# Patient Record
Sex: Male | Born: 2000 | Race: Black or African American | Hispanic: No | Marital: Single | Smoking: Never smoker
Health system: Southern US, Community
[De-identification: ages and names within clinical notes are randomized; demographics above are authoritative.]

## PROBLEM LIST (undated history)

## (undated) DIAGNOSIS — F1910 Other psychoactive substance abuse, uncomplicated: Secondary | ICD-10-CM

## (undated) DIAGNOSIS — F209 Schizophrenia, unspecified: Secondary | ICD-10-CM

## (undated) HISTORY — PX: NO PAST SURGERIES: SHX2092

---
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
## 2020-12-10 ENCOUNTER — Emergency Department (HOSPITAL_COMMUNITY)
Admission: EM | Admit: 2020-12-10 | Discharge: 2020-12-13 | Payer: BLUE CROSS/BLUE SHIELD | Attending: Emergency Medicine | Admitting: Emergency Medicine

## 2020-12-10 DIAGNOSIS — Y900 Blood alcohol level of less than 20 mg/100 ml: Secondary | ICD-10-CM | POA: Insufficient documentation

## 2020-12-10 DIAGNOSIS — Z046 Encounter for general psychiatric examination, requested by authority: Secondary | ICD-10-CM | POA: Insufficient documentation

## 2020-12-10 DIAGNOSIS — Z20822 Contact with and (suspected) exposure to covid-19: Secondary | ICD-10-CM | POA: Insufficient documentation

## 2020-12-10 DIAGNOSIS — R456 Violent behavior: Secondary | ICD-10-CM | POA: Insufficient documentation

## 2020-12-10 DIAGNOSIS — R4589 Other symptoms and signs involving emotional state: Secondary | ICD-10-CM | POA: Diagnosis present

## 2020-12-10 DIAGNOSIS — R4689 Other symptoms and signs involving appearance and behavior: Secondary | ICD-10-CM

## 2020-12-10 LAB — COMPREHENSIVE METABOLIC PANEL
ALT: 18 U/L (ref 0–44)
AST: 32 U/L (ref 15–41)
Albumin: 4 g/dL (ref 3.5–5.0)
Alkaline Phosphatase: 58 U/L (ref 38–126)
Anion gap: 11 (ref 5–15)
BUN: 21 mg/dL — ABNORMAL HIGH (ref 6–20)
CO2: 24 mmol/L (ref 22–32)
Calcium: 9.2 mg/dL (ref 8.9–10.3)
Chloride: 103 mmol/L (ref 98–111)
Creatinine, Ser: 1.24 mg/dL (ref 0.61–1.24)
GFR, Estimated: >60 mL/min (ref >60)
Glucose, Bld: 126 mg/dL — ABNORMAL HIGH (ref 70–99)
Potassium: 3.9 mmol/L (ref 3.5–5.1)
Sodium: 138 mmol/L (ref 135–145)
Total Bilirubin: 2.3 mg/dL — ABNORMAL HIGH (ref 0.3–1.2)
Total Protein: 6.7 g/dL (ref 6.5–8.1)

## 2020-12-10 LAB — RAPID URINE DRUG SCREEN, HOSP PERFORMED
Amphetamines: NOT DETECTED
Barbiturates: NOT DETECTED
Benzodiazepines: NOT DETECTED
Cocaine: NOT DETECTED
Opiates: NOT DETECTED
Tetrahydrocannabinol: NOT DETECTED

## 2020-12-10 LAB — CBC WITH DIFFERENTIAL/PLATELET
Abs Immature Granulocytes: 0.02 K/uL (ref 0.00–0.07)
Basophils Absolute: 0 K/uL (ref 0.0–0.1)
Basophils Relative: 0 %
Eosinophils Absolute: 0 K/uL (ref 0.0–0.5)
Eosinophils Relative: 0 %
HCT: 40.7 % (ref 39.0–52.0)
Hemoglobin: 13.4 g/dL (ref 13.0–17.0)
Immature Granulocytes: 0 %
Lymphocytes Relative: 29 %
Lymphs Abs: 2 K/uL (ref 0.7–4.0)
MCH: 28.7 pg (ref 26.0–34.0)
MCHC: 32.9 g/dL (ref 30.0–36.0)
MCV: 87.2 fL (ref 80.0–100.0)
Monocytes Absolute: 0.8 K/uL (ref 0.1–1.0)
Monocytes Relative: 12 %
Neutro Abs: 4.1 K/uL (ref 1.7–7.7)
Neutrophils Relative %: 59 %
Platelets: 233 K/uL (ref 150–400)
RBC: 4.67 MIL/uL (ref 4.22–5.81)
RDW: 11.7 % (ref 11.5–15.5)
WBC: 6.9 K/uL (ref 4.0–10.5)
nRBC: 0 % (ref 0.0–0.2)

## 2020-12-10 LAB — URINALYSIS, ROUTINE W REFLEX MICROSCOPIC
Bilirubin Urine: NEGATIVE
Glucose, UA: NEGATIVE mg/dL
Hgb urine dipstick: NEGATIVE
Ketones, ur: 20 mg/dL — AB
Leukocytes,Ua: NEGATIVE
Nitrite: NEGATIVE
Protein, ur: 30 mg/dL — AB
Specific Gravity, Urine: 1.028 (ref 1.005–1.030)
pH: 6 (ref 5.0–8.0)

## 2020-12-10 LAB — ETHANOL: Alcohol, Ethyl (B): <10 mg/dL

## 2020-12-10 LAB — RESP PANEL BY RT-PCR (FLU A&B, COVID) ARPGX2
Influenza A by PCR: NEGATIVE
Influenza B by PCR: NEGATIVE
SARS Coronavirus 2 by RT PCR: NEGATIVE

## 2020-12-10 LAB — SALICYLATE LEVEL: Salicylate Lvl: <7 mg/dL — ABNORMAL LOW (ref 7.0–30.0)

## 2020-12-10 LAB — ACETAMINOPHEN LEVEL: Acetaminophen (Tylenol), Serum: <10 ug/mL — ABNORMAL LOW (ref 10–30)

## 2020-12-10 MED ORDER — HALOPERIDOL LACTATE 5 MG/ML IJ SOLN
10.0000 mg | Freq: Once | INTRAMUSCULAR | Status: AC
  Administered 2020-12-10: 10 mg via INTRAMUSCULAR
  Filled 2020-12-10: qty 2

## 2020-12-10 MED ORDER — STERILE WATER FOR INJECTION IJ SOLN
INTRAMUSCULAR | Status: AC
  Filled 2020-12-10: qty 10

## 2020-12-10 MED ORDER — ZIPRASIDONE MESYLATE 20 MG IM SOLR
INTRAMUSCULAR | Status: AC
  Filled 2020-12-10: qty 20

## 2020-12-10 MED ORDER — LORAZEPAM 2 MG/ML IJ SOLN
2.0000 mg | Freq: Once | INTRAMUSCULAR | Status: AC
  Administered 2020-12-10: 2 mg via INTRAVENOUS
  Filled 2020-12-10: qty 1

## 2020-12-10 NOTE — ED Notes (Signed)
Received verbal report from Steva Colder RN at this time

## 2020-12-10 NOTE — BH Assessment (Signed)
Received notice from patient's nurse that patient is asleep from being medicated. Requested nursing to contact TTS once patient awakens and is alert enough to complete the TTS assessment.

## 2020-12-10 NOTE — ED Notes (Signed)
Patient continues to bite off his blood pressure cuff, says he does not want it on. He did agree to move cuff to his left arm. Immediately after, he started to try to remove pulse ox, and blood pressure cuff again. Continues to complain that he wants restraints removed, but will not answer questions when asked if he will cooperate and follow instructions.

## 2020-12-10 NOTE — BH Assessment (Signed)
TTS assessment:  Attempted TTS 7:45pm but RN states that pt was given Haldol @ 5pm and is too sleepy to participate at this time.   Weyman Pedro, MSW, LCSW Outpatient Therapist/Triage Specialist

## 2020-12-10 NOTE — ED Notes (Signed)
Pt now eating dinner, with restraints on wrists. Declined help with feeding and has managed to eat his meal independently.

## 2020-12-10 NOTE — ED Notes (Signed)
Patient is awake and alert at this time. He woke up and asked for a drink, when I (NT/Sitter) asked him what he would like, patient turns his head and closes his eyes. When RN came in room, she asked him again and patient responded "I've been asking you all for water". At this time he is not answering many questions when asked.

## 2020-12-10 NOTE — BH Assessment (Signed)
Received notice from nursing that patient is asleep from being medicated. Clinician requested nursing to notify TTS once patient is awake and alert for the TTS assessment to be completed.
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
## 2020-12-10 NOTE — ED Notes (Signed)
Patient finished dinner tray, appears to be more calm and alert. He is now sleeping again and comfortable.

## 2020-12-10 NOTE — ED Notes (Signed)
This patient continue to cycle through the same questions, even after they have been answered. He says he is hungry, I offered to help feed patient. He declined, and asked if restraints can be removed so that he can eat his meal. He is asking to speak to the Doctor and also yelling out in the hallway "Doctor" if he thinks he sees his physician walking by. Patient is not following instructions, is asking for request that will help him get out of restraints.

## 2020-12-10 NOTE — BH Assessment (Addendum)
TTS received an order to complete patient's initial assessment. Requested nursing to place patient's TTS machine in patient's room. Prior to seeing patient reviewed chart. However, no documentation of this visit is noted in patient's chart for this episode. No lab results/medical clearance noted.  No H&P noted. No nursing notes and/or triage notes. Clinician has reached out to EDP and nursing to obtain details significant with patient's visit prior to his tele assessment. Awaiting updates.
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
## 2020-12-11 NOTE — ED Notes (Signed)
Breakfast order placed ?

## 2020-12-11 NOTE — BH Assessment (Signed)
TTS attempted to complete assessment at 0935. Pt is not engaged in assessment and is refusing to talk. Nursing staff reports that pt is able to talk and was talking prior to start of assessment. TTS will attempt assessment at a later time and/or see if NP will assist with assessment.

## 2020-12-11 NOTE — ED Notes (Signed)
Pt would not talk to TTS , remains sitting in a chair with sitter at side

## 2020-12-11 NOTE — ED Notes (Signed)
Patient's mother called for update; pt is currently sleeping;RN will ask patient if he would like to call her back when he [facility]

## 2020-12-11 NOTE — ED Notes (Signed)
Dinner ordered 

## 2020-12-11 NOTE — ED Notes (Signed)
Pt ambulated to restroom at this time.

## 2020-12-12 DIAGNOSIS — R4589 Other symptoms and signs involving emotional state: Secondary | ICD-10-CM | POA: Diagnosis present

## 2020-12-12 MED ORDER — OLANZAPINE 5 MG PO TBDP: 5.0000 mg | ORAL_TABLET | Freq: Three times a day (TID) | ORAL | Status: DC | PRN

## 2020-12-12 MED ORDER — ZIPRASIDONE MESYLATE 20 MG IM SOLR: 20.0000 mg | INTRAMUSCULAR | Status: DC | PRN

## 2020-12-12 MED ORDER — OLANZAPINE 5 MG PO TBDP
5.0000 mg | ORAL_TABLET | Freq: Every day | ORAL | Status: DC
  Filled 2020-12-12: qty 1

## 2020-12-12 MED ORDER — OLANZAPINE 5 MG PO TBDP: 2.5000 mg | ORAL_TABLET | Freq: Every day | ORAL | Status: DC

## 2020-12-12 MED ORDER — LORAZEPAM 1 MG PO TABS: 1.0000 mg | ORAL_TABLET | ORAL | Status: DC | PRN

## 2020-12-12 NOTE — BH Assessment (Addendum)
DISPOSITION: Gave clinical report to [name], NP  who determined Pt meets criteria for inpatient psychiatric treatment. Cheron Every, [facility] at [facility] advised no appropriate beds available.  Other facilities will be contacted for placement. Notified Dr.Peter Rodena Medin, MD and Heide Guile , RN of disposition recommendation and the sitter utilization recommendation.

## 2020-12-12 NOTE — ED Notes (Signed)
Pt came out of room to use the bathroom. Once pt used the bathroom he refused to leave the bathroom. Pt went back and forth from standing in the mirror to sitting on the toilet mumbling to himself and laughing. This Clinical research associate asked pt multiple times to come out of the bathroom, pt stats "in a minute". Pt now back in his room.

## 2020-12-12 NOTE — ED Notes (Signed)
Pt making his second phone call. 

## 2020-12-12 NOTE — ED Notes (Signed)
Patient requested to shower. Supplies provided to patient for shower and clean pair of scrubs.

## 2020-12-12 NOTE — ED Notes (Signed)
LCSW informed this RN that pt has a bed at [facility] and can arrive by 8am tomorrow morning. Report can be called by day shift tomorrow at [phone]

## 2020-12-12 NOTE — ED Notes (Signed)
This RN spoke with pt multiple times about taking the medication, Zyprexa that the MD has ordered. Pt smiling at ceiling and twirling his hair constantly. Pt not willing to take the medication and not participating in the conversation. This RN asked pt what he was thinking about and pt replied "when I can get out of here." This RN explained that we are here to help him and want him to get better. This RN offered to listen if he wanted to talk about anything on his mind. Explained to pt that we can only help him as much as he allows [place] to.

## 2020-12-12 NOTE — Progress Notes (Signed)
Patient has been faxed out due to no bed availability at [facility]. Patient meets inpatient criteria per [facility]. Patient referred to the following facilities:  [facility]  [address] [place] [address] [phone]  [facility]  [address] [place] [address] [phone]  [facility]  [address], [place] [address] [phone]  [facility]  [address] [place] [address] [phone]  [facility]  [address] [place] [address] [phone]  [facility]  [address] [place] [address] [phone]  [facility]  [address] [place] [address] [phone]  [facility]  [address] [place], [place] [address] [phone]  [facility]  [address] [place], [place] [address] [phone]  [facility]  [address] [place] [address] [phone]  [facility]  [address], [place] [address] [phone]  [facility]  [address], [place] [address] [phone]     CSW will continue to monitor disposition.    Damita Dunnings, MSW, LCSW-A  10:38 AM 12/12/2020

## 2020-12-12 NOTE — Consult Note (Signed)
Telepsych Consultation   Reason for Consult: Dangerous behaviors to others. Referring Physician:  Arby Barrette, MD. Location of Patient: [facility] Location of Provider: Other: [facility]  Patient Identification: Jerry Garrison MRN:  [id number] Principal Diagnosis: Threatening to others Diagnosis:  Principal Problem:   Threatening to others   Total Time spent with patient: 15 minutes  Subjective:   Jerry Garrison is a 20 y.o. male patient admitted with Patient is brought by [facility] Department with IVC.  Patient was apprehended in [place] and was combative with police.  It required multiple individuals to restrain the patient and bring him to the emergency department in handcuffs.  Patient's mother had taken out IVC reporting that the patient had gone to a neighbor's house and pulled out a gun.  Reportedly the neighbor then pulled out a gun as well.  His mother was able to arrive at the home and bring the patient home without injury to anybody.  Patient's mother reported the patient has history of manic episodes and proceeded to get IVC paperwork for the patient.Marland Kitchen  HPI:  Patient seen via tele health by this provider; chart reviewed and consulted with Jerry Garrison on 12/12/20.  On evaluation Jerry Garrison refuses to participate in the assessment. This provider repeatedly asked the patient to engage in the assessment to determine a treatment plan, patient refused and remained mute.  During evaluation Jerry Garrison is sitting in a chair with his head held down, twirling his hair. He is alert. Unable to assess the patient's orientation. He is uncooperative; and this provider is unable to assess the patient's mood. Unable to assess if the patient is suicidal, or homicidal. Unable to assess the patient's thought process and content. He is selectively mute with no eye contact. Unable to assess if the patient is currently responding to internal/external stimuli or experiencing delusional content.   Per  nursing staff, the patient has been communicating with staff.    Past Psychiatric History:Per chart review: On two separate occassions in 2017, the patient had two episodes of erratic behaviors related to Marijuana use.   Risk to Self:  yes  Risk to Others:  yes Prior Inpatient Therapy:  unknown Prior Outpatient Therapy:  unknown  Past Medical History: No past medical history on file. No past surgical history on file. Family History: No family history on file. Family Psychiatric  History: Unknown Social History:  Social History   Substance and Sexual Activity  Alcohol Use No     Social History   Substance and Sexual Activity  Drug Use Yes   Types: Marijuana    Social History   Socioeconomic History   Marital status: Single    Spouse name: Not on file   Number of children: Not on file   Years of education: Not on file   Highest education level: Not on file  Occupational History   Not on file  Tobacco Use   Smoking status: Never   Smokeless tobacco: Not on file  Substance and Sexual Activity   Alcohol use: No   Drug use: Yes    Types: Marijuana   Sexual activity: Not on file  Other Topics Concern   Not on file  Social History Narrative   Not on file   Social Determinants of Health   Financial Resource Strain: Not on file  Food Insecurity: Not on file  Transportation Needs: Not on file  Physical Activity: Not on file  Stress: Not on file  Social Connections: Not on file  Additional Social History:    Allergies:  No Known Allergies  Labs:  Results for orders placed or performed during the hospital encounter of 12/10/20 (from the past 48 hour(s))  Resp Panel by RT-PCR (Flu A&B, Covid) Nasopharyngeal Swab     Status: None   Collection Time: 12/10/20 10:45 AM   Specimen: Nasopharyngeal Swab; Nasopharyngeal(NP) swabs in vial transport medium  Result Value Ref Range   SARS Coronavirus 2 by RT PCR NEGATIVE NEGATIVE    Comment: (NOTE) SARS-CoV-2 target  nucleic acids are NOT DETECTED.  The SARS-CoV-2 RNA is generally detectable in upper respiratory specimens during the acute phase of infection. The lowest concentration of SARS-CoV-2 viral copies this assay can detect is 138 copies/mL. A negative result does not preclude SARS-Cov-2 infection and should not be used as the sole basis for treatment or other patient management decisions. A negative result may occur with  improper specimen collection/handling, submission of specimen other than nasopharyngeal swab, presence of viral mutation(s) within the areas targeted by this assay, and inadequate number of viral copies(<138 copies/mL). A negative result must be combined with clinical observations, patient history, and epidemiological information. The expected result is Negative.  Fact Sheet for Patients:  BloggerCourse.com  Fact Sheet for Healthcare Providers:  SeriousBroker.it  This test is no t yet approved or cleared by the [place] FDA and  has been authorized for detection and/or diagnosis of SARS-CoV-2 by FDA under an Emergency Use Authorization (EUA). This EUA will remain  in effect (meaning this test can be used) for the duration of the COVID-19 declaration under Section 564(b)(1) of the Act, 21 U.S.C.section 360bbb-3(b)(1), unless the authorization is terminated  or revoked sooner.       Influenza A by PCR NEGATIVE NEGATIVE   Influenza B by PCR NEGATIVE NEGATIVE    Comment: (NOTE) The Xpert Xpress SARS-CoV-2/FLU/RSV plus assay is intended as an aid in the diagnosis of influenza from Nasopharyngeal swab specimens and should not be used as a sole basis for treatment. Nasal washings and aspirates are unacceptable for Xpert Xpress SARS-CoV-2/FLU/RSV testing.  Fact Sheet for Patients: BloggerCourse.com  Fact Sheet for Healthcare Providers: SeriousBroker.it  This test  is not yet approved or cleared by the [place] FDA and has been authorized for detection and/or diagnosis of SARS-CoV-2 by FDA under an Emergency Use Authorization (EUA). This EUA will remain in effect (meaning this test can be used) for the duration of the COVID-19 declaration under Section 564(b)(1) of the Act, 21 U.S.C. section 360bbb-3(b)(1), unless the authorization is terminated or revoked.  Performed at [facility], [address] [place], [place] [address]   Comprehensive metabolic panel     Status: Abnormal   Collection Time: 12/10/20 10:46 AM  Result Value Ref Range   Sodium 138 135 - 145 mmol/Garrison   Potassium 3.9 3.5 - 5.1 mmol/Garrison   Chloride 103 98 - 111 mmol/Garrison   CO2 24 22 - 32 mmol/Garrison   Glucose, Bld 126 (H) 70 - 99 mg/dL    Comment: Glucose reference range applies only to samples taken after fasting for at least 8 hours.   BUN 21 (H) 6 - 20 mg/dL   Creatinine, Ser 6.38 0.61 - 1.24 mg/dL   Calcium 9.2 8.9 - 75.6 mg/dL   Total Protein 6.7 6.5 - 8.1 g/dL   Albumin 4.0 3.5 - 5.0 g/dL   AST 32 15 - 41 U/Garrison   ALT 18 0 - 44 U/Garrison   Alkaline Phosphatase 58  38 - 126 U/Garrison   Total Bilirubin 2.3 (H) 0.3 - 1.2 mg/dL   GFR, Estimated >40 >98 mL/min    Comment: (NOTE) Calculated using the CKD-EPI Creatinine Equation (2021)    Anion gap 11 5 - 15    Comment: Performed at [facility], [address], [place], [place] [address]  Ethanol     Status: None   Collection Time: 12/10/20 10:46 AM  Result Value Ref Range   Alcohol, Ethyl (B) <10 <10 mg/dL    Comment: (NOTE) Lowest detectable limit for serum alcohol is 10 mg/dL.  For medical purposes only. Performed at [facility], [address], [place], [place] [address]   Acetaminophen level     Status: Abnormal   Collection Time: 12/10/20 10:46 AM  Result Value Ref Range   Acetaminophen (Tylenol), Serum <10 (Garrison) 10 - 30 ug/mL    Comment: (NOTE) Therapeutic concentrations vary significantly. A range of 10-30  ug/mL  may be an effective concentration for many patients. However, some  are best treated at concentrations outside of this range. Acetaminophen concentrations >150 ug/mL at 4 hours after ingestion  and >50 ug/mL at 12 hours after ingestion are often associated with  toxic reactions.  Performed at [facility], [address] [place], [place] [address]   Salicylate level     Status: Abnormal   Collection Time: 12/10/20 10:46 AM  Result Value Ref Range   Salicylate Lvl <7.0 (Garrison) 7.0 - 30.0 mg/dL    Comment: Performed at [facility], [address], [place], [place] [address]  CBC with Differential     Status: None   Collection Time: 12/10/20 10:46 AM  Result Value Ref Range   WBC 6.9 4.0 - 10.5 K/uL   RBC 4.67 4.22 - 5.81 MIL/uL   Hemoglobin 13.4 13.0 - 17.0 g/dL   HCT 46.9 62.9 - 52.8 %   MCV 87.2 80.0 - 100.0 fL   MCH 28.7 26.0 - 34.0 pg   MCHC 32.9 30.0 - 36.0 g/dL   RDW 41.3 24.4 - 01.0 %   Platelets 233 150 - 400 K/uL   nRBC 0.0 0.0 - 0.2 %   Neutrophils Relative % 59 %   Neutro Abs 4.1 1.7 - 7.7 K/uL   Lymphocytes Relative 29 %   Lymphs Abs 2.0 0.7 - 4.0 K/uL   Monocytes Relative 12 %   Monocytes Absolute 0.8 0.1 - 1.0 K/uL   Eosinophils Relative 0 %   Eosinophils Absolute 0.0 0.0 - 0.5 K/uL   Basophils Relative 0 %   Basophils Absolute 0.0 0.0 - 0.1 K/uL   Immature Granulocytes 0 %   Abs Immature Granulocytes 0.02 0.00 - 0.07 K/uL    Comment: Performed at [facility], [address], [place], [place] [address]  Urinalysis, Routine w reflex microscopic Urine, Clean Catch     Status: Abnormal   Collection Time: 12/10/20  4:55 PM  Result Value Ref Range   Color, Urine YELLOW YELLOW   APPearance HAZY (A) CLEAR   Specific Gravity, Urine 1.028 1.005 - 1.030   pH 6.0 5.0 - 8.0   Glucose, UA NEGATIVE NEGATIVE mg/dL   Hgb urine dipstick NEGATIVE NEGATIVE   Bilirubin Urine NEGATIVE NEGATIVE   Ketones, ur 20 (A) NEGATIVE mg/dL   Protein, ur 30 (A)  NEGATIVE mg/dL   Nitrite NEGATIVE NEGATIVE   Leukocytes,Ua NEGATIVE NEGATIVE   RBC / HPF 0-5 0 - 5 RBC/hpf  WBC, UA 0-5 0 - 5 WBC/hpf   Bacteria, UA FEW (A) NONE SEEN   Mucus PRESENT     Comment: Performed at [facility], [address] [place], [place] [address]  Urine rapid drug screen (hosp performed)     Status: None   Collection Time: 12/10/20  4:55 PM  Result Value Ref Range   Opiates NONE DETECTED NONE DETECTED   Cocaine NONE DETECTED NONE DETECTED   Benzodiazepines NONE DETECTED NONE DETECTED   Amphetamines NONE DETECTED NONE DETECTED   Tetrahydrocannabinol NONE DETECTED NONE DETECTED   Barbiturates NONE DETECTED NONE DETECTED    Comment: (NOTE) DRUG SCREEN FOR MEDICAL PURPOSES ONLY.  IF CONFIRMATION IS NEEDED FOR ANY PURPOSE, NOTIFY LAB WITHIN 5 DAYS.  LOWEST DETECTABLE LIMITS FOR URINE DRUG SCREEN Drug Class                     Cutoff (ng/mL) Amphetamine and metabolites    1000 Barbiturate and metabolites    200 Benzodiazepine                 200 Tricyclics and metabolites     300 Opiates and metabolites        300 Cocaine and metabolites        300 THC                            50 Performed at [facility], [address] [place], [place] [address]     Medications:  No current facility-administered medications for this encounter.   No current outpatient medications on file.    Musculoskeletal: Strength & Muscle Tone:  UTA Gait & Station: unsteady, ataxic, broad based, unable to stand, shuffle,   Patient leans: N/A  Psychiatric Specialty Exam:  Presentation  General Appearance: Appropriate for Environment  Eye Contact:None  Speech:Other (comment) (UTA. Pt mute)  Speech Volume:Other (comment) (UTA)  Handedness: No data recorded  Mood and Affect  Mood:-- (UTA)  Affect:Other (comment)   Thought Process  Thought Processes:Other (comment) (UTA)  Descriptions of Associations:No data recorded Orientation:Other (comment)  (UTA)  Thought Content:Other (comment) (UTA)  History of Schizophrenia/Schizoaffective disorder:No data recorded Duration of Psychotic Symptoms:Less than six months  Hallucinations:Hallucinations: Other (comment) (UTA)  Ideas of Reference:No data recorded Suicidal Thoughts:Suicidal Thoughts: -- (UTA)  Homicidal Thoughts:Homicidal Thoughts: -- (UTA)   Sensorium  Memory:Other (comment) (UTA)  Judgment:Other (comment) (UTA)  Insight:Other (comment) (UTA)   Executive Functions  Concentration:Other (comment) (UTA)  Attention Span:Other (comment) (UTA)  Recall:Other (comment) (UTA)  Fund of Knowledge:Other (comment) (UTA)  Language:Other (comment) (UTA)   Psychomotor Activity  Psychomotor Activity: No data recorded  Assets  Assets:Other (comment) (UTA)   Sleep  Sleep:Sleep: -- (UTA)    Physical Exam: Physical Exam Constitutional:      Appearance: Normal appearance.  Cardiovascular:     Rate and Rhythm: Normal rate.  Neurological:     Mental Status: He is alert.   Review of Systems  Unable to perform ROS: Other (Patient is selectively mute)  Blood pressure 109/66, pulse 98, temperature 98.5 F (36.9 C), temperature source Oral, resp. rate 18, height 6' (1.829 m), weight 55 kg, SpO2 98 %. Body mass index is 16.44 kg/m.  Treatment Plan Summary: Daily contact with patient to assess and evaluate symptoms and progress in treatment, Medication management, and Plan Patient recommended for inpatient psychiatric treatment.  Disposition: Recommend psychiatric Inpatient admission when medically cleared.  This service was provided via telemedicine using a 2-way, interactive audio and video technology.  Names of all persons participating in this telemedicine service and their role in this encounter. Name: Jerry Garrison  Role: Patient   Name: [name] Role: NP  Name: Jerry Garrison  Role: LCSWA  Name: Jerry Garrison  Role: Psychiatrist    A secure chat was sent  to the patient's nurse Jerry Guile, RN., that the patient is recommended for inpatient psychiatric treatment.  Jerry Wilhelmsen L, NP 12/12/2020 8:28 AM

## 2020-12-12 NOTE — ED Notes (Signed)
Pt given lunch tray.

## 2020-12-12 NOTE — ED Notes (Signed)
Patient is actively doing push ups in room.

## 2020-12-12 NOTE — Consult Note (Addendum)
Jerry Garrison 19 y.o.   Patient started on Zyprexa Zydis 5 mg PO at bedtime and Zyprexa Zydis 2.5 mg PO daily for mood stabilization.   Secure chat sent to the patient's nurse Heide Guile, RN.   Agitation protocol added due to patient refusing oral medications and aggressive behaviors.

## 2020-12-12 NOTE — ED Notes (Signed)
Pt given breakfast tray. Pt eating calmly in his room.

## 2020-12-12 NOTE — ED Notes (Signed)
Pt making a phone call. ?

## 2020-12-12 NOTE — BH Assessment (Addendum)
Clinician spoke with nurse Monique.  She said that patient was sleeping at this time.  TTS to try to see patient when alert.

## 2020-12-12 NOTE — ED Notes (Signed)
Pt pulled code blue alarm in room. Pt laughing in room. Staff instructed pt not to pull this alarm.

## 2020-12-12 NOTE — BH Assessment (Addendum)
Comprehensive Clinical Assessment (CCA) Note  12/12/2020 Jerry Garrison [id number]  DISPOSITION: Gave clinical report to Jerry Nixon, NP  who determined Pt meets criteria for inpatient psychiatric treatment.  Jerry Garrison, AC at [facility] advised no appropriate bed for patient . Other facilities will be contacted for placement. Notified Dr. [name], MD and [facility] , RN of disposition recommendation and the sitter utilization recommendation.   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 Acute risk factors for suicide include: N/A. Protective factors for this patient include: positive social support. Considering these factors, the overall suicide risk at this point appears to be high. Patient is appropriate for outpatient follow up.   Patient refused to engage with TTS or NP , patient twisted his hair and looked down at the floor during the entire interview. Patient did not provide any information nor used any non verbal motions when asked questions Please refer to ED provider note below .   Per ED Provider : Patient is brought by [facility] Department with IVC.  Patient was apprehended in [place] and was combative with police.  It required multiple individuals to restrain the patient and bring him to the emergency department in handcuffs.  Patient's mother had taken out IVC reporting that the patient had gone to a neighbor's house and pulled out a gun.  Reportedly the neighbor then pulled out a gun as well.  His mother was able to arrive at the home and bring the patient home without injury to anybody.  Patient's mother reported the patient has history of manic episodes and proceeded to get IVC paperwork for the patient.  On arrival, the patient was  conversing with police and insisting he be released.  He appeared to be situationally oriented without being acutely confused.  The patient however would not answer questions for me to describe what had happened from his perspective or any of the surrounding details.  All history that I have been able to obtain is through the IVC paperwork in the police.  DISPOSITION: Gave clinical report to Jerry Nixon, NP  who determined Pt meets criteria for inpatient psychiatric treatment.  Jerry Garrison, AC at [facility] advised no appropriate bed for patient . Other facilities will be contacted for placement. Notified Dr. [name], MD and [facility] , RN of disposition recommendation and the sitter utilization recommendation.   Chief Complaint: No chief complaint on file.  Visit Diagnosis: Combative behavior  Involuntary commitment      CCA Screening, Triage and Referral (STR)  Patient Reported Information How did you hear about [place]? No data recorded Referral name: No data recorded Referral phone number: No data recorded  Whom do you see for routine medical problems? No data recorded Practice/Facility Name: No data recorded Practice/Facility Phone Number: No data recorded Name of Contact: No data recorded Contact Number: No data recorded Contact Fax Number: No data recorded Prescriber Name: No data recorded Prescriber Address (if known): No data recorded  What Is the Reason for Your Visit/Call Today? No data recorded How Long Has This Been Causing You Problems? No data recorded What Do You Feel Would Help You the Most Today? No data recorded  Have You Recently Been in Any Inpatient Treatment (Hospital/Detox/Crisis Center/28-Day Program)? No data recorded Name/Location of Program/Hospital:No  data recorded How Long Were You There? No data recorded When Were You Discharged? No data recorded  Have You Ever Received Services From [facility] Before? No data recorded Who Do You See at [facility]? No data recorded  Have You Recently Had Any Thoughts About Hurting Yourself? No data recorded Are You Planning to Commit Suicide/Harm Yourself At This time? No data recorded  Have you Recently Had Thoughts About Hurting Someone Jerry Garrison? No data recorded Explanation: No data recorded  Have You Used Any Alcohol or Drugs in the Past 24 Hours? No data recorded How Long Ago Did You Use Drugs or Alcohol? No data recorded What Did You Use and How Much? No data recorded  Do You Currently Have a Therapist/Psychiatrist? No data recorded Name of Therapist/Psychiatrist: No data recorded  Have You Been Recently Discharged From Any Office Practice or Programs? No data recorded Explanation of Discharge From Practice/Program: No data recorded    CCA Screening Triage Referral Assessment Type of Contact: No data recorded Is this Initial or Reassessment? No data recorded Date Telepsych consult ordered in [facility]:  No data recorded Time Telepsych consult ordered in [facility]:  No data recorded  Patient Reported Information Reviewed? No data recorded Patient Left Without Being Seen? No data recorded Reason for Not Completing Assessment: No data recorded  Collateral Involvement: No data recorded  Does Patient Have a Court Appointed Legal Guardian? No data recorded Name and Contact of Legal Guardian: No data recorded If Minor and Not Living with Parent(s), Who has Custody? No data recorded Is CPS involved or ever been involved? No data recorded Is APS involved or ever been involved? No data recorded  Patient Determined To Be At Risk for Harm To Self or Others Based on Review of Patient Reported Information or Presenting Complaint? No data recorded Method: No data recorded Availability of Means: No data recorded Intent: No data recorded Notification Required: No data recorded Additional Information for Danger to Others Potential: No data recorded Additional Comments for Danger to Others Potential: No  data recorded Are There Guns or Other Weapons in Your Home? No data recorded Types of Guns/Weapons: No data recorded Are These Weapons Safely Secured?                            No data recorded Who Could Verify You Are Able To Have These Secured: No data recorded Do You Have any Outstanding Charges, Pending Court Dates, Parole/Probation? No data recorded Contacted To Inform of Risk of Harm To Self or Others: No data recorded  Location of Assessment: No data recorded  Does Patient Present under Involuntary Commitment? No data recorded IVC Papers Initial File Date: No data recorded  [place] of Residence: No data recorded  Patient Currently Receiving the Following Services: No data recorded  Determination of Need: No data recorded  Options For Referral: No data recorded    CCA Biopsychosocial Intake/Chief Complaint:  Pt BIB [employer]. Per IVC paperwork pt went to a neighbors house and pulled out a gun on the neighbor. Neighbor then pulled out a gun on him . Mother arrived and took pt home. Per mother pt has h/o manic episodes. Mother went to court house to get pt IVC'd  Current Symptoms/Problems: UTA / patient wil not participate   Patient Reported Schizophrenia/Schizoaffective Diagnosis in Past: No data recorded  Strengths: No data recorded Preferences: No data recorded Abilities: No data recorded  Type of Services Patient Feels  are Needed: No data recorded  Initial Clinical Notes/Concerns: No data recorded  Mental Health Symptoms Depression:   Irritability   Duration of Depressive symptoms:  Greater than two weeks   Mania:   Irritability   Anxiety:    Irritability   Psychosis:   Grossly disorganized or catatonic behavior   Duration of Psychotic symptoms:  Less than six months   Trauma:   N/A   Obsessions:   N/A   Compulsions:   N/A   Inattention:   N/A   Hyperactivity/Impulsivity:   N/A   Oppositional/Defiant Behaviors:   N/A   Emotional  Irregularity:   Mood lability   Other Mood/Personality Symptoms:  No data recorded   Mental Status Exam Appearance and self-care  Stature:   Average   Weight:   Average weight   Clothing:   Casual   Grooming:   Normal   Cosmetic use:   None   Posture/gait:   Slumped   Motor activity:   Agitated   Sensorium  Attention:   Inattentive   Concentration:   Preoccupied   Orientation:  No data recorded  Recall/memory:   -- (UTA)   Affect and Mood  Affect:   Negative; Restricted   Mood:   Negative   Relating  Eye contact:   None   Facial expression:   Constricted   Attitude toward examiner:   Uninterested   Thought and Language  Speech flow:  Mute   Thought content:   Appropriate to Mood and Circumstances   Preoccupation:   None   Hallucinations:   None   Organization:  No data recorded  Affiliated Computer ServicesExecutive Functions  Fund of Knowledge:  No data recorded  Intelligence:   Needs investigation   Abstraction:  No data recorded  Judgement:   Impaired   Reality Testing:   Unaware   Insight:   Lacking   Decision Making:   Confused   Social Functioning  Social Maturity:   Irresponsible   Social Judgement:   "Chief of Stafftreet Smart"   Stress  Stressors:  No data recorded  Coping Ability:   Human resources officerverwhelmed   Skill Deficits:   Aeronautical engineerDecision making; Communication; Self-control   Supports:   Family     Religion:    Leisure/Recreation:    Exercise/Diet:     CCA Employment/Education Employment/Work Situation: Employment / Work Situation Employment Situation:  Industrial/product designer(UTA)  Education:     CCA Family/Childhood History Family and Relationship History:    Childhood History:  Childhood History By whom was/is the patient raised?: Mother Does patient have siblings?:  Industrial/product designer(uta) Did patient suffer any verbal/emotional/physical/sexual abuse as a child?:  (UTA) Did patient suffer from severe childhood neglect?:  (UTA) Has patient ever been sexually  abused/assaulted/raped as an adolescent or adult?:  (UTA) Was the patient ever a victim of a crime or a disaster?:  (UTA) Witnessed domestic violence?:  (UTA) Has 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(UTA)  Child/Adolescent Assessment:     CCA Substance Use Alcohol/Drug Use: Alcohol / Drug Use Pain Medications: SEE MAR Prescriptions: SEE MAR Over the Counter: SEE MAR History of alcohol / drug use?: No history of alcohol / drug abuse                         ASAM's:  Six Dimensions of Multidimensional Assessment  Dimension 1:  Acute Intoxication and/or Withdrawal Potential:      Dimension 2:  Biomedical Conditions and Complications:  Dimension 3:  Emotional, Behavioral, or Cognitive Conditions and Complications:     Dimension 4:  Readiness to Change:     Dimension 5:  Relapse, Continued use, or Continued Problem Potential:     Dimension 6:  Recovery/Living Environment:     ASAM Severity Score:    ASAM Recommended Level of Treatment:     Substance use Disorder (SUD)    Recommendations for Services/Supports/Treatments:    DSM5 Diagnoses: There are no problems to display for this patient.   Patient Centered Plan: Patient is on the following Treatment Plan(s):     Referrals to Alternative Service(s): Referred to Alternative Service(s):   Place:   Date:   Time:    Referred to Alternative Service(s):   Place:   Date:   Time:    Referred to Alternative Service(s):   Place:   Date:   Time:    Referred to Alternative Service(s):   Place:   Date:   Time:     Rachel Moulds, [place]

## 2020-12-12 NOTE — ED Notes (Signed)
Pt refused to speak during TTS. After TTS the pt stated "I ain't talking til I make a phone call" This writer asked pt who he needed to call and pt stated a family member.

## 2020-12-12 NOTE — Progress Notes (Signed)
Pt accepted to [facility]   Patient meets inpatient criteria per [name], NP.   Dr.Thomas Loyola Mast is the attending provider.    Call report to [phone]   [facility], RN @ [facility] ED notified.     Pt scheduled  to arrive at [facility] after 0800.   Damita Dunnings, MSW, LCSW-A  12:16 PM 12/12/2020

## 2020-12-12 NOTE — ED Notes (Signed)
Patient ambulated to and from bathroom with a steady gait. Patient is now resting. Even Resp. Sitter at bedside. No distress noted at this time. Will continue to monitor.

## 2020-12-13 NOTE — ED Provider Notes (Signed)
Patient has been accepted by [facility] by Dr. Estill Cotta patient will be transported by [facility] department.  EMTALA and transfer paperwork completed   Vanetta Mulders, MD 12/13/20 [phone]

## 2020-12-13 NOTE — Progress Notes (Signed)
This CSW received a phone call from patient's mother, Joss Mcdill.  CSW informed mother that this CSW would not be able to provide any information about patient due to HIPPA and no consent to speak with mother.  Mother reports that she is seeking emergent guardianship to learn additional information. Patient mother reported that she was the one that [facility] patient.  Mother also reports history of what got patient into the hospital.  She also reports that she does not want patient to go to [facility] due to past hospitalization where patient was not treated right. CSW did not provide any additional information about patient status or next steps.    Mother requested for the [facility] ED phone number which this CSW provided.    Davier Tramell, LCSW, LCAS Clincal Social Worker  [facility]

## 2020-12-13 NOTE — ED Notes (Addendum)
Pt using phone at nurses station, speaking with mother

## 2020-12-13 NOTE — ED Notes (Signed)
[place] Sheriff's office notified that pt is ready for transport to [facility]

## 2020-12-13 NOTE — ED Notes (Signed)
Report given to [facility] at [facility]

## 2020-12-13 NOTE — ED Notes (Signed)
Sheriff at bedside for transport. 

## 2020-12-13 NOTE — ED Notes (Signed)
Breakfast at bedside.

## 2021-01-01 ENCOUNTER — Other Ambulatory Visit: Payer: Self-pay

## 2021-01-01 ENCOUNTER — Emergency Department (HOSPITAL_COMMUNITY)
Admission: EM | Admit: 2021-01-01 | Discharge: 2021-01-07 | Disposition: A | Discharge status: Other Acute Inpatient Hospital | Payer: BLUE CROSS/BLUE SHIELD | Attending: Emergency Medicine | Admitting: Emergency Medicine

## 2021-01-01 ENCOUNTER — Ambulatory Visit (HOSPITAL_COMMUNITY)
Admission: EM | Admit: 2021-01-01 | Discharge: 2021-01-01 | Disposition: A | Discharge status: Other Acute Inpatient Hospital | Payer: BLUE CROSS/BLUE SHIELD | Attending: Family | Admitting: Family

## 2021-01-01 DIAGNOSIS — Z20822 Contact with and (suspected) exposure to covid-19: Secondary | ICD-10-CM | POA: Insufficient documentation

## 2021-01-01 DIAGNOSIS — F23 Brief psychotic disorder: Secondary | ICD-10-CM

## 2021-01-01 DIAGNOSIS — Z046 Encounter for general psychiatric examination, requested by authority: Secondary | ICD-10-CM | POA: Diagnosis present

## 2021-01-01 DIAGNOSIS — F29 Unspecified psychosis not due to a substance or known physiological condition: Secondary | ICD-10-CM | POA: Diagnosis not present

## 2021-01-01 LAB — COMPREHENSIVE METABOLIC PANEL
ALT: 67 U/L — ABNORMAL HIGH (ref 0–44)
AST: 57 U/L — ABNORMAL HIGH (ref 15–41)
Albumin: 4.2 g/dL (ref 3.5–5.0)
Alkaline Phosphatase: 69 U/L (ref 38–126)
Anion gap: 10 (ref 5–15)
BUN: 10 mg/dL (ref 6–20)
CO2: 23 mmol/L (ref 22–32)
Calcium: 9.4 mg/dL (ref 8.9–10.3)
Chloride: 101 mmol/L (ref 98–111)
Creatinine, Ser: 1.05 mg/dL (ref 0.61–1.24)
GFR, Estimated: >60 mL/min (ref >60)
Glucose, Bld: 101 mg/dL — ABNORMAL HIGH (ref 70–99)
Potassium: 3.6 mmol/L (ref 3.5–5.1)
Sodium: 134 mmol/L — ABNORMAL LOW (ref 135–145)
Total Bilirubin: 1.6 mg/dL — ABNORMAL HIGH (ref 0.3–1.2)
Total Protein: 7.8 g/dL (ref 6.5–8.1)

## 2021-01-01 LAB — CBC WITH DIFFERENTIAL/PLATELET
Abs Immature Granulocytes: 0.02 10*3/uL (ref 0.00–0.07)
Basophils Absolute: 0 10*3/uL (ref 0.0–0.1)
Basophils Relative: 0 %
Eosinophils Absolute: 0 10*3/uL (ref 0.0–0.5)
Eosinophils Relative: 0 %
HCT: 42.3 % (ref 39.0–52.0)
Hemoglobin: 14.1 g/dL (ref 13.0–17.0)
Immature Granulocytes: 0 %
Lymphocytes Relative: 16 %
Lymphs Abs: 1.1 10*3/uL (ref 0.7–4.0)
MCH: 29 pg (ref 26.0–34.0)
MCHC: 33.3 g/dL (ref 30.0–36.0)
MCV: 86.9 fL (ref 80.0–100.0)
Monocytes Absolute: 1.3 10*3/uL — ABNORMAL HIGH (ref 0.1–1.0)
Monocytes Relative: 19 %
Neutro Abs: 4.4 10*3/uL (ref 1.7–7.7)
Neutrophils Relative %: 65 %
Platelets: 227 10*3/uL (ref 150–400)
RBC: 4.87 MIL/uL (ref 4.22–5.81)
RDW: 11.8 % (ref 11.5–15.5)
WBC: 6.9 10*3/uL (ref 4.0–10.5)
nRBC: 0 % (ref 0.0–0.2)

## 2021-01-01 LAB — RESP PANEL BY RT-PCR (FLU A&B, COVID) ARPGX2
Influenza A by PCR: NEGATIVE
Influenza B by PCR: NEGATIVE
SARS Coronavirus 2 by RT PCR: NEGATIVE

## 2021-01-01 LAB — ETHANOL: Alcohol, Ethyl (B): <10 mg/dL (ref <10)

## 2021-01-01 MED ORDER — HALOPERIDOL 5 MG PO TABS: 10.0000 mg | ORAL_TABLET | Freq: Three times a day (TID) | ORAL | Status: DC | PRN

## 2021-01-01 MED ORDER — BENZTROPINE MESYLATE 1 MG/ML IJ SOLN
1.0000 mg | Freq: Three times a day (TID) | INTRAMUSCULAR | Status: DC | PRN
  Administered 2021-01-01: 1 mg via INTRAMUSCULAR
  Filled 2021-01-01: qty 2

## 2021-01-01 MED ORDER — LORAZEPAM 1 MG PO TABS: 2.0000 mg | ORAL_TABLET | Freq: Three times a day (TID) | ORAL | Status: DC | PRN

## 2021-01-01 MED ORDER — BENZTROPINE MESYLATE 1 MG PO TABS: 1.0000 mg | ORAL_TABLET | Freq: Three times a day (TID) | ORAL | Status: DC | PRN

## 2021-01-01 MED ORDER — HALOPERIDOL LACTATE 5 MG/ML IJ SOLN
10.0000 mg | Freq: Three times a day (TID) | INTRAMUSCULAR | Status: DC | PRN
  Administered 2021-01-01: 10 mg via INTRAMUSCULAR
  Filled 2021-01-01: qty 2

## 2021-01-01 MED ORDER — LORAZEPAM 2 MG/ML IJ SOLN
2.0000 mg | Freq: Three times a day (TID) | INTRAMUSCULAR | Status: DC | PRN
  Administered 2021-01-01: 2 mg via INTRAMUSCULAR
  Filled 2021-01-01: qty 1

## 2021-01-01 NOTE — Progress Notes (Signed)
   01/01/21 1357  [facility] Triage Screening (Walk-ins at [facility] only)  How Did You Hear About [place]? Legal System  What Is the Reason for Your Visit/Call Today? Patient presents via [employer] under IVC due to being a misssing person for the past two days.  Per mother, petitioner, patient has been wandering and not eating/drinking.  He has a hx of K2 spice use in college and he experienced psychosis, with dx of Schizophrenia.  Patient is refusing to answer questions, stating "I plead the fifth." Per [employer] patient would not get out of their car "until y'all force me."   Mother had initiated IVC process.  Pt eventually got out of the car.  Patient will not answer questions.  How Long Has This Been Causing You Problems? 1 wk - 1 month  Have You Recently Had Any Thoughts About Hurting Yourself?  (UTA)  Are You Planning to Commit Suicide/Harm Yourself At This time?  (UTA)  Have you Recently Had Thoughts About Hurting Someone Else?  (UTA)  Are You Planning To Harm Someone At This Time?  (UTA)  Are you currently experiencing any auditory, visual or other hallucinations?  (UTA)  Have You Used Any Alcohol or Drugs in the Past 24 Hours?  (UTA)  Do you have any current medical co-morbidities that require immediate attention?  (UTA)  Clinician description of patient physical appearance/behavior: Patient is dressed appropriately, does not appear disheveled, smiles when asked questions.  If access to [facility] was not available, would you have sought care in the Emergency Department? Yes  Determination of Need Urgent (48 hours)  Options For Referral [facility];Inpatient Hospitalization

## 2021-01-01 NOTE — ED Notes (Signed)
Belongings inventoried and placed in purple zone locker #3.

## 2021-01-01 NOTE — ED Triage Notes (Signed)
Pt BIB [facility]. Per ems,  pt is here d/t agitation and [facility] is unable to handle him d/t staffing issues. Pt is sedated with 10 mg Haldol, 2 Ativan and 1 cogentin  at 1515.  Vital Signs:  BP 108/60 Hr 90 Spo2 100%  14 RR

## 2021-01-01 NOTE — ED Provider Notes (Signed)
[facility] EMERGENCY DEPARTMENT Provider Note   CSN: [id number] Arrival date & time: 01/01/21  1606     History No chief complaint on file.   Jerry Garrison is a 20 y.o. male.  Patient presents the emergency department after being seen at behavioral health for aggressive behavior.  Patient is under IVC.  Physician states that he was missing for several days, [place] the [place] in the same close he was last seen wearing.  He has not been taking care of himself.  Patient had agitation and aggressive behavior.  He was given 10 mg of Haldol, 2 mg of Ativan, 1 mg of Cogentin.  Level 5 caveat due to sedation, psychiatric illness.      No past medical history on file.  Patient Active Problem List   Diagnosis Date Noted   Threatening to others 12/12/2020    No past surgical history on file.     No family history on file.  Social History   Tobacco Use   Smoking status: Never  Substance Use Topics   Alcohol use: No   Drug use: Yes    Types: Marijuana    Home Medications Prior to Admission medications   Not on File    Allergies    Patient has no known allergies.  Review of Systems   Review of Systems  Unable to perform ROS: Psychiatric disorder   Physical Exam Updated Vital Signs There were no vitals taken for this visit.  Physical Exam Vitals and nursing note reviewed.  Constitutional:      General: He is not in acute distress.    Appearance: He is well-developed.     Comments: Patient sedated, sleeping at time of exam.  HENT:     Head: Normocephalic and atraumatic.     Right Ear: External ear normal.     Left Ear: External ear normal.     Nose: Nose normal.     Mouth/Throat:     Mouth: Mucous membranes are moist.  Eyes:     General:        Right eye: No discharge.        Left eye: No discharge.     Conjunctiva/sclera: Conjunctivae normal.     Comments: Pupils 2 to 3 mm, reactive.  Cardiovascular:     Rate and Rhythm:  Normal rate and regular rhythm.     Heart sounds: Normal heart sounds.  Pulmonary:     Effort: Pulmonary effort is normal.     Breath sounds: Normal breath sounds.  Abdominal:     Palpations: Abdomen is soft.     Tenderness: There is no abdominal tenderness.  Musculoskeletal:     Cervical back: Normal range of motion and neck supple.     Right lower leg: No edema.     Left lower leg: No edema.  Skin:    General: Skin is warm and dry.     Comments: No obvious external trauma.  Neurological:     Mental Status: He is alert.     Comments: Sedated.    ED Results / Procedures / Treatments   Labs (all labs ordered are listed, but only abnormal results are displayed) Labs Reviewed  COMPREHENSIVE METABOLIC PANEL - Abnormal; Notable for the following components:      Result Value   Sodium 134 (*)    Glucose, Bld 101 (*)    AST 57 (*)    ALT 67 (*)    Total Bilirubin  1.6 (*)    All other components within normal limits  CBC WITH DIFFERENTIAL/PLATELET - Abnormal; Notable for the following components:   Monocytes Absolute 1.3 (*)    All other components within normal limits  RESP PANEL BY RT-PCR (FLU A&B, COVID) [id number]  ETHANOL  RAPID URINE DRUG SCREEN, HOSP PERFORMED    EKG EKG Interpretation  Date/Time:  Wednesday January 01 2021 16:09:14 EDT Ventricular Rate:  92 PR Interval:  147 QRS Duration: 94 QT Interval:  354 QTC Calculation: 438 R Axis:   74 Text Interpretation: Sinus rhythm LVH by voltage Confirmed by Lockie Mola, Adam ([id number]) on 01/01/2021 4:49:27 PM  Radiology No results found.  Procedures Procedures   Medications Ordered in ED Medications - No data to display  ED Course  I have reviewed the triage vital signs and the nursing notes.  Pertinent labs & imaging results that were available during my care of the patient were reviewed by me and considered in my medical decision making (see chart for details).  Patient seen and examined.  Reviewed IVC and behavioral  health notes.  Patient was admitted earlier this month to [facility].  Patient sedated, unable to take history or cooperate with exam.  Medical screening labs sent.  First exam already completed.  Patient meets inpatient criteria, will be awaiting placement after medical clearance.  Vital signs reviewed and are as follows: BP 122/79   Pulse 91   Temp 98.4 F (36.9 C) (Axillary)   Resp 16   SpO2 98%   10:51 PM Labs reviewed, UDS. Pt is medically cleared.  Reviewed home meds.     MDM Rules/Calculators/A&P                          Pending placement.    Final Clinical Impression(s) / ED Diagnoses Final diagnoses:  Psychosis, unspecified psychosis type [facility])    Rx / DC Orders ED Discharge Orders     None        Renne Crigler, PA-C 01/01/21 2252    Virgina Norfolk, DO 01/01/21 2321

## 2021-01-01 NOTE — ED Notes (Signed)
Warm blankets placed on pt. 

## 2021-01-01 NOTE — BH Assessment (Addendum)
Comprehensive Clinical Assessment (CCA) Note  01/01/2021 Jerry Garrison [id number]  Disposition: Per Berneice Heinrichina Tate, NP inpatient treatment is recommended.  Disposition SW to pursue appropriate inpatient options.  Due to patient requiring security intervention and restraint, he will be transferred to the ED due to acuity pending inpatient acceptance.   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 Acute risk factors for suicide include: family or marital conflict and recent discharge from inpatient psychiatry. Protective factors for this patient include: positive social support, coping skills, and life satisfaction. Considering these factors, the overall suicide risk at this point appears to be low. Patient is appropriate for outpatient follow up.   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  Patient was speaking intermittently with officers en route about "questions are a form of oppression."  Per IVC, patient has been wandeing and has been reported as a missing person for the past two days.  He has been walking during this time from [place] to [place].   Per chart review, patient was just [facility] with similar presentation:   "[place] [facility] [place].  [place] apprehended in [place] and was combative with police.  It required multiple individuals to restrain the patient and bring him to the emergency department in handcuffs.  Patient's mother had taken out IVC reporting that the patient had gone to a neighbor's house and pulled out a gun.  Reportedly the neighbor then pulled out a gun as well.  His mother was able to arrive at the home and  bring the patient home without injury to anybody.  Patient's mother reported the patient has history of manic episodes and proceeded to get IVC paperwork for the patient.  On arrival, the patient was conversing with police and insisting he be released.  He appeared to be situationally oriented without being acutely confused.  The patient however would not answer questions for me to describe what had happened from his perspective or any of the surrounding details.  All history that I have been able to obtain is through the IVC paperwork in the police."  Patient refused to answer questions from MHT and RN on arrival.  He then refused to speak with this Clinical research associatewriter for triage portion of assessment.  While holding with officers, awaiting IVC paperwork, patient informed officers he would only speak with this Clinical research associatewriter.  At this point, attempted to assess, however patient continues to refuse to speak.  He is observed smiling and staring at this writer, and at walls during attempt by this [facility] and provider to assess.  Doran Heaterina Allen, NP informed patient he would need medication and asked if he preferred oral or IM medication.  Patient refused to respond.  Per patient's mother/petitioner, patient discontinued medication after [facility].  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  Per mother, he was using substances laced with K2 and "his mind has not been the same since."  He has been in and out of inpatient programs for the past year.  Patient was engaged and fiance got pregnant.  Patient's "ex-fiance" had the baby in January 2021 and he  has not been able to see the baby since. Patient continues to communicate with ex-fiance, who continues to promise to FaceTime with patient so he can see the baby.  This has yet to happen and patient's mother feels this is patient's biggest stressor that has been "devastating for him."  Patient's mother is  hopeful that patient will be admitted and stabilized on medication.     Chief Complaint: No chief complaint on file.  Unable to complete CSSR/PHQ-9 - patient refuses to engage in assessment  Visit Diagnosis: Brief psychotic episode    CCA Screening, Triage and Referral (STR)  Patient Reported Information How did you hear about [place]? Legal System  What Is the Reason for Your Visit/Call Today? Patient presents via [employer] under IVC due to being a misssing person for the past two days.  Per mother, petitioner, patient has been wandering and not eating/drinking.  He has a hx of K2 spice use in college and he experienced psychosis, with dx of Schizophrenia.  Patient is refusing to answer questions, stating "I plead the fifth." Per [employer] patient would not get out of their car "until y'all force me."   Mother had initiated IVC process.  Pt eventually got out of the car.  Patient will not answer questions.  How Long Has This Been Causing You Problems? 1 wk - 1 month  What Do You Feel Would Help You the Most Today? No data recorded  Have You Recently Had Any Thoughts About Hurting Yourself? -- (UTA)  Are You Planning to Commit Suicide/Harm Yourself At This time? -- (UTA)   Have you Recently Had Thoughts About Hurting Someone Else? -- (UTA)  Are You Planning to Harm Someone at This Time? -- (UTA)  Explanation: No data recorded  Have You Used Any Alcohol or Drugs in the Past 24 Hours? -- (UTA)  How Long Ago Did You Use Drugs or Alcohol? No data recorded What Did You Use and How Much? No data recorded  Do You Currently Have a Therapist/Psychiatrist? No data recorded Name of Therapist/Psychiatrist: No data recorded  Have You Been Recently Discharged From Any Office Practice or Programs? No data recorded Explanation of Discharge From Practice/Program: No data recorded    CCA Screening Triage Referral Assessment Type of Contact: No data recorded Telemedicine Service Delivery:   Is this  Initial or Reassessment? No data recorded Date Telepsych consult ordered in [facility]:  No data recorded Time Telepsych consult ordered in [facility]:  No data recorded Location of Assessment: No data recorded Provider Location: No data recorded  Collateral Involvement: No data recorded  Does Patient Have a Court Appointed Legal Guardian? No data recorded Name and Contact of Legal Guardian: No data recorded If Minor and Not Living with Parent(s), Who has Custody? No data recorded Is CPS involved or ever been involved? No data recorded Is APS involved or ever been involved? No data recorded  Patient Determined To Be At Risk for Harm To Self or Others Based on Review of Patient Reported Information or Presenting Complaint? No data recorded Method: No data recorded Availability of Means: No data recorded Intent: No data recorded Notification Required: No data recorded Additional Information for Danger to Others Potential: No data recorded Additional Comments for Danger to Others Potential: No data recorded Are There Guns or Other Weapons in Your Home? No data recorded Types of Guns/Weapons: No data recorded Are These Weapons Safely Secured?  No data recorded Who Could Verify You Are Able To Have These Secured: No data recorded Do You Have any Outstanding Charges, Pending Court Dates, Parole/Probation? No data recorded Contacted To Inform of Risk of Harm To Self or Others: No data recorded   Does Patient Present under Involuntary Commitment? No data recorded IVC Papers Initial File Date: No data recorded  [place] of Residence: No data recorded  Patient Currently Receiving the Following Services: No data recorded  Determination of Need: Urgent (48 hours)   Options For Referral: [facility]; Inpatient Hospitalization   CCA Biopsychosocial Patient Reported Schizophrenia/Schizoaffective Diagnosis in Past: Yes   Strengths: Patient has support   Mental Health  Symptoms Depression:   Irritability (UTA -patient refusing/unable to speak)   Duration of Depressive symptoms:  Duration of Depressive Symptoms: -- (UTA -patient refusing/unable to speak)   Mania:   Irritability; Increased Energy   Anxiety:    Irritability; Restlessness   Psychosis:   Grossly disorganized or catatonic behavior   Duration of Psychotic symptoms:  Duration of Psychotic Symptoms: Greater than six months   Trauma:   N/A (UTA -patient refusing/unable to speak)   Obsessions:   N/A   Compulsions:   N/A   Inattention:   N/A   Hyperactivity/Impulsivity:   N/A   Oppositional/Defiant Behaviors:   N/A   Emotional Irregularity:   Mood lability; Transient, stress-related paranoia/disassociation   Other Mood/Personality Symptoms:  No data recorded   Mental Status Exam Appearance and self-care  Stature:   Average   Weight:   Thin   Clothing:   Casual   Grooming:   Normal   Cosmetic use:   None   Posture/gait:   Slumped   Motor activity:   Agitated   Sensorium  Attention:   Inattentive   Concentration:   Preoccupied   Orientation:   -- (UTA -patient refusing/unable to speak)   Recall/memory:   -- (UTA)   Affect and Mood  Affect:   Negative; Restricted   Mood:   Negative   Relating  Eye contact:   Staring   Facial expression:   Constricted   Attitude toward examiner:   Uninterested; Guarded   Thought and Language  Speech flow:  Mute   Thought content:   -- (UTA -patient refusing/unable to speak)   Preoccupation:   None (UTA -patient refusing/unable to speak)   Hallucinations:   None   Organization:  No data recorded  [employer] of Knowledge:   -- (UTA -patient refusing/unable to speak)   Intelligence:   Needs investigation   Abstraction:   -- (UTA -patient refusing/unable to speak)   Judgement:   Impaired   Reality Testing:   Variable; Distorted   Insight:   Poor   Decision  Making:   Confused; Impulsive   Social Functioning  Social Maturity:   Irresponsible   Social Judgement:   "Chief of Staff"   Stress  Stressors:   -- 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)   Coping Ability:   Overwhelmed   Skill Deficits:   Decision making; Communication; Self-control   Supports:   Family     Religion: Religion/Spirituality Are You A Religious Person?:  (UTA -patient refusing/unable to speak) How Might This Affect Treatment?: UTA -patient refusing/unable to speak  Leisure/Recreation: Leisure / Recreation Do You Have Hobbies?:  (UTA -patient refusing/unable to speak)  Exercise/Diet: Exercise/Diet Do You Exercise?:  (UTA -patient refusing/unable to speak) Have You Gained or Lost A Significant Amount of Weight in  the Past Six Months?:  (UTA -patient refusing/unable to speak) Do You Follow a Special Diet?:  (UTA -patient refusing/unable to speak) Do You Have Any Trouble Sleeping?:  (UTA -patient refusing/unable to speak)   CCA Employment/Education Employment/Work Situation:    Education:    CCA Family/Childhood History Family and Relationship History:    Childhood History:  Childhood History By whom was/is the patient raised?: Mother Did patient suffer any verbal/emotional/physical/sexual abuse as a child?:  (UTA) Did patient suffer from severe childhood neglect?:  (UTA) Has patient ever been sexually abused/assaulted/raped as an adolescent or adult?:  (UTA) Was the patient ever a victim of a crime or a disaster?:  (UTA) Witnessed domestic violence?:  (UTA) Has 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)  Child/Adolescent Assessment:    CCA Substance Use Alcohol/Drug Use: Alcohol / Drug Use Pain Medications: SEE MAR Prescriptions: SEE MAR Over the Counter: SEE MAR History of alcohol / drug use?: No history of alcohol / drug abuse     ASAM's:  Six Dimensions of Multidimensional Assessment  Dimension 1:  Acute  Intoxication and/or Withdrawal Potential:      Dimension 2:  Biomedical Conditions and Complications:      Dimension 3:  Emotional, Behavioral, or Cognitive Conditions and Complications:     Dimension 4:  Readiness to Change:     Dimension 5:  Relapse, Continued use, or Continued Problem Potential:     Dimension 6:  Recovery/Living Environment:     ASAM Severity Score:    ASAM Recommended Level of Treatment:     Substance use Disorder (SUD)    Recommendations for Services/Supports/Treatments:    Discharge Disposition:    DSM5 Diagnoses: Patient Active Problem List   Diagnosis Date Noted   Threatening to others 12/12/2020    Referrals to Alternative Service(s): Yetta Glassman, [facility]

## 2021-01-01 NOTE — ED Notes (Signed)
Valuables placed with security. 
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
## 2021-01-02 NOTE — ED Notes (Signed)
Pt took a shower today

## 2021-01-02 NOTE — ED Notes (Signed)
Pt changed into purple scrubs 

## 2021-01-03 MED ORDER — ZIPRASIDONE MESYLATE 20 MG IM SOLR
20.0000 mg | Freq: Once | INTRAMUSCULAR | Status: AC
  Administered 2021-01-03: 20 mg via INTRAMUSCULAR
  Filled 2021-01-03: qty 20

## 2021-01-03 MED ORDER — STERILE WATER FOR INJECTION IJ SOLN
INTRAMUSCULAR | Status: AC
  Filled 2021-01-03: qty 10

## 2021-01-03 NOTE — BH Assessment (Signed)
This counselor contacted Jerry Garrison to see patient for re-evaluation. Per Andrey Campanile, RN patient received Geodon and is currently sleeping. He is unable to participate at this time.

## 2021-01-03 NOTE — Progress Notes (Signed)
Patient has been referred out due to no bed available at [facility]. Patient meets inpatient criteria per Inetta Fermo Allen,NP. Patient referred to the following facilities:  [facility]  [address] [place] [address] [phone]  [facility]  [address] [place] [address] [phone]  [facility]  [address] [place] [address] [phone]  [facility]  [address] [place] [address] [phone]  [facility]  [address] [place] [address] [phone]  [facility]  [address] [place], [place] [address] [phone]  [facility]  [address] [place], [place] [address] [phone]  [facility]  [address] [place] [address] [phone]  [facility]  [address], [place] [address] [phone]  [facility]  [address] [place] [address] [phone]     CSW will continue to monitor disposition.    Damita Dunnings, MSW, LCSW-A  3:16 PM 01/03/2021

## 2021-01-03 NOTE — ED Provider Notes (Signed)
Emergency Medicine Observation Re-evaluation Note  Jerry Garrison is a 20 y.o. male, seen on rounds today.  Pt initially presented to the ED for complaints of No chief complaint on file. Currently, the patient is resting.  Physical Exam  BP 99/60 (BP Location: Right Arm)   Pulse 84   Temp 98.5 F (36.9 C) (Oral)   Resp 20   SpO2 98%  Physical Exam General: resting comfortably, NAD Lungs: normal WOB Psych: currently calm and resting  ED Course / MDM  EKG:EKG Interpretation  Date/Time:  Wednesday January 01 2021 16:09:14 EDT Ventricular Rate:  92 PR Interval:  147 QRS Duration: 94 QT Interval:  354 QTC Calculation: 438 R Axis:   74 Text Interpretation: Sinus rhythm LVH by voltage Confirmed by Lockie Mola, Adam ([id number]) on 01/01/2021 4:49:27 PM  I have reviewed the labs performed to date as well as medications administered while in observation.  Recent changes in the last 24 hours include none.  Plan  Current plan is for inpatient psychiatric placement, patient has been medically cleared. Patient is under full IVC at this time.   Rozelle Logan, [place] 01/03/21 9628

## 2021-01-03 NOTE — ED Notes (Signed)
Breakfast Orders Placed °

## 2021-01-03 NOTE — ED Notes (Signed)
MD updated on patient acting out with security at bedside. New orders received.

## 2021-01-03 NOTE — ED Notes (Signed)
Pt stepped outside his room and wandering near the nurse's station. States that he wanted to step out for a minute. Sitter asked that he put on a mask. Pt refused. Pt was informed that if he chooses to stay in the hall, that he must wear a face mask per hospital policy. Pt redirected back to his room and remains calm and cooperative at this time.

## 2021-01-03 NOTE — ED Notes (Signed)
Pt continuing to sleep throughout the night. Will try to obtain vitals when pt is awake. NAD noted, RR even. 

## 2021-01-03 NOTE — ED Notes (Signed)
Pt stepped out to nursing station to make phone call, still refusing to put mask on. RN takes phone away and pt resisted and held on to the phone and says " I cant make a phone call? I cant talk with my face mask on". Security called. Pt attempts to grab phone again and refused to let go. Pt informed that he lost phone call privileges. Pt is now refusing to go back to his room without a phone call. Security brought him back to room and remain at bedside.

## 2021-01-03 NOTE — Progress Notes (Signed)
TTS consult has been placed. CSW notified psychiatry about patient. Psychiatry had recommended inpatient treatment on 01/01/21.

## 2021-01-03 NOTE — ED Notes (Signed)
Pt is sitting outside by the double doors of purple zone. Security called to assist redirecting the pt back to his room. Pt is not cooperative. Remains silent as the nurse and this tech encourage him to go back to his room without security intervening. Pt got up off the floor and returned to his room.

## 2021-01-03 NOTE — ED Notes (Signed)
Security remain on unit outside patient room, patient standing at doorway repeating "Give me my phone call", "I want to wear a mask. Attempting to come out of room past security.

## 2021-01-03 NOTE — Progress Notes (Signed)
Patient information has been sent to [facility] via secure chat to review for potential admission. Patient meets inpatient criteria per Doran Heater, NP.   Situation ongoing, CSW will continue to monitor progress.    Signed:  Damita Dunnings, MSW, LCSW-A  01/03/2021 1:54 PM

## 2021-01-04 MED ORDER — ZIPRASIDONE MESYLATE 20 MG IM SOLR
20.0000 mg | Freq: Once | INTRAMUSCULAR | Status: AC
  Administered 2021-01-04: 20 mg via INTRAMUSCULAR
  Filled 2021-01-04: qty 20
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
## 2021-01-04 NOTE — ED Notes (Signed)
Provider at bedside

## 2021-01-04 NOTE — ED Notes (Signed)
Patient stating he wants to shower but being uncooperative with staff. Security escorted patient to shower.

## 2021-01-04 NOTE — BH Assessment (Signed)
TTS attempted to see patient, but he is currently agitated and cannot be re-directed, getting Geodon

## 2021-01-04 NOTE — ED Notes (Signed)
Patient showering

## 2021-01-04 NOTE — Progress Notes (Signed)
Per Ardell Isaacs, patient meets criteria for inpatient treatment. There are no available or appropriate beds at [facility] today. CSW faxed referrals to the following facilities for review:  [facility]  TTS will continue to seek bed placement.  Crissie Reese, MSW, LCSW-A, LCAS-A Phone: [phone] Disposition/TOC

## 2021-01-05 MED ORDER — OLANZAPINE 10 MG IM SOLR: 10.0000 mg | Freq: Every day | INTRAMUSCULAR | Status: DC

## 2021-01-05 MED ORDER — OLANZAPINE 5 MG PO TBDP
10.0000 mg | ORAL_TABLET | Freq: Every day | ORAL | Status: DC
  Administered 2021-01-06 – 2021-01-07 (×2): 10 mg via ORAL
  Filled 2021-01-05 (×3): qty 2

## 2021-01-05 NOTE — BH Assessment (Signed)
Comprehensive Clinical Assessment (CCA) Screening, Triage and Referral Note  01/05/2021 Jerry Garrison [id number]  Per Ophelia Shoulder, NP, patient continues to meet criteria for inpatient treatment.   Patient is guarded and is refusing to fully participate in assessment. Patient is laying in the bed, smiling and looking at television. Patient denies SI, HI, AVH and reports he does not want to continue with assessment. TTS informed patient that he was recommended for inpatient treatment and asked patient if he was ok with going inpatient. Patient stated that he did not want to go inpatient but refuses to talk with TTS. TTS attempted to encourage patient to talk to see if he could be discharged. Patient stated that it did not matter and he did not want to talk. Patient did not consent for TTS to obtain collateral from his mother.    Chief Complaint: No chief complaint on file.  Visit Diagnosis: Brief psychotic episode   Patient Reported Information How did you hear about [place]? Legal System  What Is the Reason for Your Visit/Call Today? Pt recommended for inpt treatment on 6/29  How Long Has This Been Causing You Problems? 1 wk - 1 month  What Do You Feel Would Help You the Most Today? Treatment for Depression or other mood problem   Have You Recently Had Any Thoughts About Hurting Yourself? No  Are You Planning to Commit Suicide/Harm Yourself At This time? No   Have you Recently Had Thoughts About Hurting Someone [name]? No  Are You Planning to Harm Someone at This Time? No  Explanation: No data recorded  Have You Used Any Alcohol or Drugs in the Past 24 Hours? No  How Long Ago Did You Use Drugs or Alcohol? No data recorded What Did You Use and How Much? No data recorded  Do You Currently Have a Therapist/Psychiatrist? No data recorded Name of Therapist/Psychiatrist: No data recorded  Have You Been Recently Discharged From Any Office Practice or Programs? No data recorded Explanation  of Discharge From Practice/Program: No data recorded   CCA Screening Triage Referral Assessment Type of Contact: Tele-Assessment  Telemedicine Service Delivery: Telemedicine service delivery: This service was provided via telemedicine using a 2-way, interactive audio and video technology  Is this Initial or Reassessment? Reassessment  Date Telepsych consult ordered in [facility]L:  No data recorded Time Telepsych consult ordered in CHL:  No data recorded Location of Assessment: [facility] ED  Provider Location: [facility] Services   Collateral Involvement: Pt did not consent for TTS to talk with his mother   Does Patient Have a Automotive engineer Guardian? No data recorded Name and Contact of Legal Guardian: No data recorded If Minor and Not Living with Parent(s), Who has Custody? No data recorded Is CPS involved or ever been involved? No data recorded Is APS involved or ever been involved? No data recorded  Patient Determined To Be At Risk for Harm To Self or Others Based on Review of Patient Reported Information or Presenting Complaint? No  Method: No data recorded Availability of Means: No data recorded Intent: No data recorded Notification Required: No data recorded Additional Information for Danger to Others Potential: No data recorded Additional Comments for Danger to Others Potential: No data recorded Are There Guns or Other Weapons in Your Home? No data recorded Types of Guns/Weapons: No data recorded Are These Weapons Safely Secured?  No data recorded Who Could Verify You Are Able To Have These Secured: No data recorded Do You Have any Outstanding Charges, Pending Court Dates, Parole/Probation? No data recorded Contacted To Inform of Risk of Harm To Self or Others: No data recorded  Does Patient Present under Involuntary Commitment? Yes  IVC Papers Initial File Date: 01/01/21   [place] of Residence: [place]   Patient Currently Receiving the  Following Services: No data recorded  Determination of Need: Emergent (2 hours)   Options For Referral: Medication Management; Outpatient Therapy   Discharge Disposition:     Audree Camel, [facility]

## 2021-01-05 NOTE — ED Notes (Signed)
Assumed care of pt at this time. Pt alert and oriented x 4 watching television. Respirations even and unlabored. NAD noted. No needs expressed at this time, will continue to monitor.

## 2021-01-05 NOTE — ED Notes (Signed)
Attempted tts again but pt still will not talk to them , pt has been cooperative with ED staff

## 2021-01-05 NOTE — BH Assessment (Addendum)
TTS attempted reassessment. Pt engaged in conversation for a brief moment when talking about things unrelated to psychiatric issues, however pt refuses to participate in reassessment at this time. Rapport was established with pt so TTS will attempt to complete reassessment later this day. Pt currently recommended for inpatient treatment.

## 2021-01-05 NOTE — ED Notes (Signed)
Pt refused Zyprexa.  States he doesn't want any medication.  Explained to him the reasoning for medication and he still refused.  Pt smiling and states he feels fine.

## 2021-01-06 ENCOUNTER — Emergency Department (HOSPITAL_COMMUNITY): Payer: BLUE CROSS/BLUE SHIELD

## 2021-01-06 DIAGNOSIS — F23 Brief psychotic disorder: Secondary | ICD-10-CM | POA: Diagnosis present

## 2021-01-06 NOTE — ED Notes (Addendum)
Pt still refusing to provide urine sample, but is calm and otherwise cooperative

## 2021-01-06 NOTE — BH Assessment (Signed)
Disposition:  Per Assunta Found, NP, pt continues to meet inpatient treatment. Pt accepted to [facility] pending head CT scan, which as been ordered.  Upon chart review: Patient refusing CT scam and per NP, nursing will need to continue trying to get patient to take scan. Pt scheduled  to arrive at [facility] tomorrow after 0800 The accepting provider is Dr.Thomas Loyola Mast is the attending provider. Call report to [phone]. Per Marylene Land, RN @ [facility] ED notified.

## 2021-01-06 NOTE — Progress Notes (Signed)
Patient was faxed out due to previous denial from [facility] inpatient. Patient meets inpatient criteria per [facility]. Patient referred to the following facilities:  [facility]  [address] [place] [address] [phone]  [facility]  [address] [place] [address] [phone]  [facility]  [address], [place] [address] [phone]  [facility]  [address], [place] [address] [phone]  [facility]  [address] [place] [address] [phone]  [facility]  [address] [place], [place] [address] [phone]  [facility]  [address] [place], [place] [address] [phone]  [facility]  [address] [place] [address] [phone]  [facility]  [address] [place] [address] [phone]  [facility]  [address] [place] [address] [phone]  [facility]  [address] [place] [place] [address] [phone]  [facility]  [address] [place] [place] [address] [phone]  [facility]  [address] [place] [address] [phone]  [facility]  [address] [place] [address] [phone]  [facility]  [address] [place], [place] [address] [phone]  [facility]  [address] [place] [address] [phone]  [facility]  [address] [place] [address] [phone]  [facility]  [address],  [place] [address] [phone]  [facility]  [address] [place] [address] [phone]  [facility]  [address], [place] [address] [phone]  [facility]  [address] [place] [address] [phone]  [facility]  [address], [place] [address] [phone]    CSW will continue to monitor disposition.    Damita Dunnings, MSW, LCSW-A  9:35 AM 01/06/2021

## 2021-01-06 NOTE — ED Notes (Signed)
Patient sitting up in bed, eating lunch provided. Informed patient of needing a CT scan and responded. "I don't need no scan". Patient also stated "I don't feel like talking"

## 2021-01-06 NOTE — ED Notes (Signed)
Pt refusing head CT at this time

## 2021-01-06 NOTE — ED Notes (Signed)
Patient was given a Cup of Water. 

## 2021-01-06 NOTE — Progress Notes (Signed)
Pt accepted to [facility]   Patient meets inpatient criteria per Ophelia Shoulder, NP   Dr.Thomas Loyola Mast is the attending provider.    Call report to [phone]  Marylene Land, RN @ [facility] ED notified.     Pt scheduled  to arrive at [facility] tomorrow after 0800.   Damita Dunnings, MSW, LCSW-A  9:53 AM 01/06/2021

## 2021-01-06 NOTE — Consult Note (Addendum)
Telepsych Consultation   Reason for Consult:  Psychosis Referring Physician:  Rozelle Logan, DO Location of Patient: [facility] ED Location of Provider: Other: [facility]  Patient Identification: Jerry Garrison MRN:  [id number] Principal Diagnosis: Brief psychotic disorder (HCC) Diagnosis:  Principal Problem:   Brief psychotic disorder (HCC)   Total Time spent with patient: 30 minutes  Subjective:   Jerry Garrison is a 20 y.o. male patient admitted to [facility] ED with complaints of psychosis  HPI:  Jerry Garrison, 20 y.o., male patient seen via tele health by this provider, consulted with Dr. Nelly Rout; and chart reviewed on 01/06/21.  On evaluation Jerry Garrison states he doesn't want to talk.  Patient informed would need to speak with him to determine what type of help was needed and to assess if he was okay to go home patient stated again "I don't want to talk."  Patient asked if he was eating and sleeping okay he stated "I don't want to talk."    Spoke to patients nurse Marylene Land, RN who informed that patient has been sleeping and did not want to talk.  States that patient refused his medication at first but then gave him the option of either taking by mouth or an injection; and states that patient then took his medication.    During evaluation Jerry Garrison is standing in no acute distress.  Unable to complete PSE and psychiatric assessment related to patient not wanting to talk.    Past Psychiatric History: Psychosis    Past Medical History: No past medical history on file. No past surgical history on file. Family History: No family history on file. Family Psychiatric  History: Unaware Social History:  Social History   Substance and Sexual Activity  Alcohol Use No     Social History   Substance and Sexual Activity  Drug Use Yes   Types: Marijuana    Social History   Socioeconomic History   Marital status: Single    Spouse name: Not on file   Number of children: Not on  file   Years of education: Not on file   Highest education level: Not on file  Occupational History   Not on file  Tobacco Use   Smoking status: Never   Smokeless tobacco: Not on file  Substance and Sexual Activity   Alcohol use: No   Drug use: Yes    Types: Marijuana   Sexual activity: Not on file  Other Topics Concern   Not on file  Social History Narrative   Not on file   Social Determinants of Health   Financial Resource Strain: Not on file  Food Insecurity: Not on file  Transportation Needs: Not on file  Physical Activity: Not on file  Stress: Not on file  Social Connections: Not on file   Additional Social History:    Allergies:  No Known Allergies  Labs: No results found for this or any previous visit (from the past 48 hour(s)).  Medications:  Current Facility-Administered Medications  Medication Dose Route Frequency Provider Last Rate Last Admin   OLANZapine zydis (ZYPREXA) disintegrating tablet 10 mg  10 mg Oral Daily Ophelia Shoulder E, NP   10 mg at 01/06/21 0932   Or   OLANZapine (ZYPREXA) injection 10 mg  10 mg Intramuscular Daily Chales Abrahams, NP       Current Outpatient Medications  Medication Sig Dispense Refill   OLANZapine zydis (ZYPREXA) 10 MG disintegrating tablet Take 10 mg by mouth at bedtime. (  Patient not taking: Reported on 01/01/2021)      Musculoskeletal: Strength & Muscle Tone: within normal limits Gait & Station: normal Patient leans: N/A   Psychiatric Specialty Exam:  Presentation  General Appearance: Appropriate for Environment  Eye Contact:Fair  Speech:Clear and Coherent; Normal Rate  Speech Volume:Normal  Handedness:Right   Mood and Affect  Mood:Labile  Affect:Flat   Thought Process  Thought Processes:-- (Unable to assess)  Descriptions of Associations:-- (Unable to assess)  Orientation:-- (Unable to assess)  Thought Content:Other (comment) (Unable to assess patient refusing to participate in  assessment)  History of Schizophrenia/Schizoaffective disorder:No  Duration of Psychotic Symptoms:Greater than six months  Hallucinations:Hallucinations: Other (comment) (Unable to assess)  Ideas of Reference:-- (Unable to assess.  Patient appears to have some paranoia)  Suicidal Thoughts:Suicidal Thoughts: -- (Unable to assess)  Homicidal Thoughts:Homicidal Thoughts: -- (Unable to assess)   Sensorium  Memory:Other (comment) (Unable to assess)  Judgment:Impaired  Insight:Other (comment) (Unable to assess)   Executive Functions  Concentration:-- (Unable to assess)  Attention Span:Other (comment) (Unable to assess)  Recall:Other (comment) (Unable to assess)  Fund of Knowledge:Other (comment) (Unable to assess)  Language:Other (comment) (Unable to assess)   Psychomotor Activity  Psychomotor Activity:Psychomotor Activity: Normal   Assets  Assets:Housing; Social Support (Unable to assess)   Sleep  Sleep:Sleep: -- (Unable to assess)    Physical Exam: Physical Exam Vitals and nursing note reviewed. Exam conducted with a chaperone present.  Constitutional:      General: He is not in acute distress.    Appearance: Normal appearance. He is not ill-appearing.  Cardiovascular:     Rate and Rhythm: Normal rate.  Pulmonary:     Effort: Pulmonary effort is normal.  Neurological:     Mental Status: He is alert.  Psychiatric:        Mood and Affect: Affect is labile.        Speech: He is noncommunicative.        Behavior: Behavior is uncooperative.        Thought Content: Thought content is paranoid.   Review of Systems  Unable to perform ROS: Other (Patient refusing to participate in assessment)  Blood pressure 117/72, pulse 67, temperature 98.2 F (36.8 C), temperature source Oral, resp. rate 18, SpO2 100 %. There is no height or weight on file to calculate BMI.  Treatment Plan Summary: Daily contact with patient to assess and evaluate symptoms and progress in  treatment, Medication management, and Plan Inpatient psychiatric treatment   CT head ordered:  Brief psychotic disorder  Disposition: Recommend psychiatric Inpatient admission when medically cleared.  This service was provided via telemedicine using a 2-way, interactive audio and video technology.  Names of all persons participating in this telemedicine service and their role in this encounter. Name: Assunta Found, NP Role: PMHNP  Name: Dr. Nelly Rout Role: Psychiatrist  Name: Jerry Garrison Role: Patient  Name: Loretha Stapler, RN Role: Patient's nurse sent a secure message informing:  Psychiatric consult complete.  Patient continues to need inpatient psychiatric treatment.  Continue to work with patient on CT scan of head    Letetia Romanello, NP 01/06/2021 1:50 PM

## 2021-01-07 NOTE — ED Notes (Signed)
Offered to call mother or family to notify of transfer pt declined

## 2021-01-07 NOTE — ED Provider Notes (Signed)
Emergency Medicine Observation Re-evaluation Note  Jerry Garrison is a 20 y.o. male, seen on rounds today.  Pt initially presented to the ED for complaints of IVC - [facility] team recommending inpatient psych treatment. Pt has been accepted to [facility], Dr Loyola Mast. This AM, patient is awake, alert, cooperative, conversant. Denies any physical symptoms. No fever. No headache, no problems w speech or vision, no numbness/weakness or problems balance, gait, or normal functional ability. No cp or sob.   Physical Exam  BP 93/70 (BP Location: Left Arm)   Pulse 77   Temp 97.6 F (36.4 C) (Oral)   Resp 16   SpO2 99%  Physical Exam General: Alert, cooperative, conversant.  Cardiac: Regular rate. Lungs: Breathing comfortably. Psych: Calm, cooperative. Normal mood/affect. Does not appear to be responding to internal stimuli. Neuro: Alert, oriented. Motor/sens grossly intact bil. Steady gait.   ED Course / MDM  EKG:EKG Interpretation  Date/Time:  Wednesday January 01 2021 16:09:14 EDT Ventricular Rate:  92 PR Interval:  147 QRS Duration: 94 QT Interval:  354 QTC Calculation: 438 R Axis:   74 Text Interpretation: Sinus rhythm LVH by voltage Confirmed by Lockie Mola, Adam ([id number]) on 01/01/2021 4:49:27 PM  I have reviewed the labs performed to date as well as medications administered while in observation.  Recent changes in the last 24 hours include acceptance to [facility] for inpatient psych evaluation and care.   Plan  Current plan is for transfer to [facility], accepted by Dr Loyola Mast.   Patient currently appears stable for transport.      Cathren Laine, MD 01/07/21 1124

## 2021-01-07 NOTE — Discharge Instructions (Addendum)
Transfer to [facility] 

## 2021-01-07 NOTE — Progress Notes (Signed)
Pt agreed to have his CT head scan done. I brought pt to CT, and he told me that "he wasn't going to lay down", and refused his scan. Pt returned to his room.

## 2021-01-07 NOTE — ED Notes (Signed)
Placed Breakfast Order 

## 2021-04-04 ENCOUNTER — Encounter (HOSPITAL_COMMUNITY): Payer: Self-pay | Admitting: *Deleted

## 2021-04-04 ENCOUNTER — Other Ambulatory Visit: Payer: Self-pay

## 2021-04-04 ENCOUNTER — Emergency Department (HOSPITAL_COMMUNITY)
Admission: EM | Admit: 2021-04-04 | Discharge: 2021-04-07 | Disposition: A | Discharge status: Home / Self Care | Payer: BLUE CROSS/BLUE SHIELD | Attending: Emergency Medicine | Admitting: Emergency Medicine

## 2021-04-04 DIAGNOSIS — Z20822 Contact with and (suspected) exposure to covid-19: Secondary | ICD-10-CM | POA: Insufficient documentation

## 2021-04-04 DIAGNOSIS — R4689 Other symptoms and signs involving appearance and behavior: Secondary | ICD-10-CM

## 2021-04-04 DIAGNOSIS — F209 Schizophrenia, unspecified: Secondary | ICD-10-CM | POA: Diagnosis not present

## 2021-04-04 DIAGNOSIS — F989 Unspecified behavioral and emotional disorders with onset usually occurring in childhood and adolescence: Secondary | ICD-10-CM | POA: Diagnosis present

## 2021-04-04 DIAGNOSIS — F29 Unspecified psychosis not due to a substance or known physiological condition: Secondary | ICD-10-CM | POA: Insufficient documentation

## 2021-04-04 DIAGNOSIS — Z79899 Other long term (current) drug therapy: Secondary | ICD-10-CM | POA: Insufficient documentation

## 2021-04-04 HISTORY — DX: Schizophrenia, unspecified: F20.9

## 2021-04-04 LAB — CBC WITH DIFFERENTIAL/PLATELET
Abs Immature Granulocytes: 0.02 10*3/uL (ref 0.00–0.07)
Basophils Absolute: 0 10*3/uL (ref 0.0–0.1)
Basophils Relative: 0 %
Eosinophils Absolute: 0 10*3/uL (ref 0.0–0.5)
Eosinophils Relative: 0 %
HCT: 45.2 % (ref 39.0–52.0)
Hemoglobin: 14.8 g/dL (ref 13.0–17.0)
Immature Granulocytes: 0 %
Lymphocytes Relative: 9 %
Lymphs Abs: 1 10*3/uL (ref 0.7–4.0)
MCH: 29 pg (ref 26.0–34.0)
MCHC: 32.7 g/dL (ref 30.0–36.0)
MCV: 88.6 fL (ref 80.0–100.0)
Monocytes Absolute: 1.2 10*3/uL — ABNORMAL HIGH (ref 0.1–1.0)
Monocytes Relative: 11 %
Neutro Abs: 8.6 10*3/uL — ABNORMAL HIGH (ref 1.7–7.7)
Neutrophils Relative %: 80 %
Platelets: 215 10*3/uL (ref 150–400)
RBC: 5.1 MIL/uL (ref 4.22–5.81)
RDW: 11.9 % (ref 11.5–15.5)
WBC: 10.8 10*3/uL — ABNORMAL HIGH (ref 4.0–10.5)
nRBC: 0 % (ref 0.0–0.2)

## 2021-04-04 LAB — RAPID URINE DRUG SCREEN, HOSP PERFORMED
Amphetamines: NOT DETECTED
Barbiturates: NOT DETECTED
Benzodiazepines: NOT DETECTED
Cocaine: NOT DETECTED
Opiates: NOT DETECTED
Tetrahydrocannabinol: NOT DETECTED

## 2021-04-04 LAB — RESP PANEL BY RT-PCR (FLU A&B, COVID) ARPGX2
Influenza A by PCR: NEGATIVE
Influenza B by PCR: NEGATIVE
SARS Coronavirus 2 by RT PCR: NEGATIVE

## 2021-04-04 LAB — COMPREHENSIVE METABOLIC PANEL
ALT: 23 U/L (ref 0–44)
AST: 56 U/L — ABNORMAL HIGH (ref 15–41)
Albumin: 4.9 g/dL (ref 3.5–5.0)
Alkaline Phosphatase: 75 U/L (ref 38–126)
Anion gap: 13 (ref 5–15)
BUN: 11 mg/dL (ref 6–20)
CO2: 25 mmol/L (ref 22–32)
Calcium: 9.3 mg/dL (ref 8.9–10.3)
Chloride: 100 mmol/L (ref 98–111)
Creatinine, Ser: 1.18 mg/dL (ref 0.61–1.24)
GFR, Estimated: >60 mL/min (ref >60)
Glucose, Bld: 69 mg/dL — ABNORMAL LOW (ref 70–99)
Potassium: 4 mmol/L (ref 3.5–5.1)
Sodium: 138 mmol/L (ref 135–145)
Total Bilirubin: 2.7 mg/dL — ABNORMAL HIGH (ref 0.3–1.2)
Total Protein: 8 g/dL (ref 6.5–8.1)

## 2021-04-04 LAB — ETHANOL: Alcohol, Ethyl (B): <10 mg/dL (ref <10)

## 2021-04-04 MED ORDER — ALUM & MAG HYDROXIDE-SIMETH 200-200-20 MG/5ML PO SUSP: 30.0000 mL | Freq: Four times a day (QID) | ORAL | Status: DC | PRN

## 2021-04-04 MED ORDER — IBUPROFEN 400 MG PO TABS: 600.0000 mg | ORAL_TABLET | Freq: Three times a day (TID) | ORAL | Status: DC | PRN

## 2021-04-04 MED ORDER — ONDANSETRON HCL 4 MG PO TABS: 4.0000 mg | ORAL_TABLET | Freq: Three times a day (TID) | ORAL | Status: DC | PRN

## 2021-04-04 NOTE — ED Notes (Signed)
Pt dressed out and wanded by security. Belongings included black backpack with wallet noted in the side pocked - placed this in front zipper pocket for safety. Also placed shoes, socks, sweatpants and hoodie in pt bag with sticker placed on it. Items would not fit into a locker, so placed in psych closet on top shelf. Both bags labeled with pt stickers.

## 2021-04-04 NOTE — Progress Notes (Signed)
Inpatient Behavioral Health Placement  Pt meets inpatient criteria per Nira Conn, FNP. [facility] has no appropriate beds due to capacity and acuity.  Referral was sent to the following outside facilities:    Destination Service Provider Address Phone Fax  [facility]  [address] [place] [address] [phone]  [facility]  [address], [place] [address] [phone]  [facility]  [address] [place] [address] [phone]  [facility]  [address] [place] [address] [phone]  [facility]  [address], [place] [address] [phone]  [facility]  [address], [place] [address] [phone]  [facility]  [address], [place] [address] [phone]  [facility]  [address] [place] [address] [phone]  [facility]  [address] [place] [address] [phone]  [facility]  [address] [place], [place] [address] [phone]  [facility]  [address] [place] [address] [phone]  [facility]  [address] [place] [address] [phone]   Situation ongoing,  CSW will follow up.   Maryjean Ka, MSW, [facility] 04/04/2021  @ 11:29 PM

## 2021-04-04 NOTE — BH Assessment (Signed)
TTS sent secure chat for RN to fax pt IVC at 1331. No response. TTS will assess pt later today.
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
## 2021-04-04 NOTE — ED Notes (Signed)
Pt pacing room at this time

## 2021-04-05 MED ORDER — OLANZAPINE 5 MG PO TABS
15.0000 mg | ORAL_TABLET | Freq: Every day | ORAL | Status: DC
  Administered 2021-04-05 – 2021-04-06 (×2): 15 mg via ORAL
  Filled 2021-04-05 (×2): qty 3

## 2021-04-05 MED ORDER — BENZTROPINE MESYLATE 1 MG PO TABS
1.0000 mg | ORAL_TABLET | Freq: Every day | ORAL | Status: DC
  Administered 2021-04-05 – 2021-04-06 (×2): 1 mg via ORAL
  Filled 2021-04-05 (×2): qty 1
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
## 2021-04-05 NOTE — Progress Notes (Signed)
Per Dorathy Daft, Admissions, pt has been accepted to [facility] main campus. Accepting provider is Dr. Estill Cotta. Patient can arrive anytime. Number for report is [phone]. Please fax IVC paperwork to facility before calling report;Fax number#: [phone].    Crissie Reese, MSW, LCSW-A Phone: [phone] Disposition/TOC

## 2021-04-06 ENCOUNTER — Encounter (HOSPITAL_COMMUNITY): Payer: Self-pay

## 2021-04-06 NOTE — BH Assessment (Signed)
TTS Reassessment:  Pt presents with very positive affect.  Pt is smiling and cooperative with clinician.  Pt  reports good quality and quantity of sleep and good appetite/eating well.   Pt denies any current SI, HI, or AVH.  Pt reports that the IVC was a mistake and that his family members "just don't want me out there on the streets".   Pt does not have any support people that could help pt stay safe if discharged.   Pt continues to meet inpt criteria.

## 2021-04-06 NOTE — ED Notes (Signed)
Resting at this time. Meal tray placed on bedside table. 

## 2021-04-07 NOTE — ED Provider Notes (Signed)
Emergency Medicine Observation Re-evaluation Note  [facility] is a 20 y.o. male, seen on rounds today.  Pt initially presented to the ED for complaints of Mental Health Problem Currently, the patient is sleeping.  Physical Exam  BP 107/76 (BP Location: Right Arm)   Pulse 60   Temp 98.6 F (37 C)   Resp 15   Ht 1.727 m (5\' 8" )   Wt 61.2 kg   SpO2 100%   BMI 20.52 kg/m  Physical Exam General: Resting comfortably Cardiac: Deferred Lungs: Breathing easily, no distress Psych: Calm, resting  ED Course / MDM  EKG:   I have reviewed the labs performed to date as well as medications administered while in observation.  Recent changes in the last 24 hours include no acute events overnight.  Plan  Current plan is for inpatient psychiatric treatment. [facility] is under involuntary commitment.      [name], MD 04/07/21 [phone]

## 2021-04-07 NOTE — ED Notes (Signed)
Pt breakfast at bedside 

## 2021-04-07 NOTE — ED Notes (Signed)
Spoke with Jeannene Patella, Rn at [facility] reguarding pt bed status.  Pt still has bed assigned there and is ready for sheriff to transport.

## 2021-08-05 ENCOUNTER — Encounter (HOSPITAL_COMMUNITY): Payer: Self-pay

## 2021-08-05 ENCOUNTER — Other Ambulatory Visit: Payer: Self-pay

## 2021-08-05 ENCOUNTER — Emergency Department (HOSPITAL_COMMUNITY)
Admission: EM | Admit: 2021-08-05 | Discharge: 2021-08-07 | Disposition: A | Discharge status: Other Acute Inpatient Hospital | Payer: BLUE CROSS/BLUE SHIELD | Attending: Emergency Medicine | Admitting: Emergency Medicine

## 2021-08-05 DIAGNOSIS — F323 Major depressive disorder, single episode, severe with psychotic features: Secondary | ICD-10-CM | POA: Diagnosis not present

## 2021-08-05 DIAGNOSIS — Z20822 Contact with and (suspected) exposure to covid-19: Secondary | ICD-10-CM | POA: Insufficient documentation

## 2021-08-05 DIAGNOSIS — R45851 Suicidal ideations: Secondary | ICD-10-CM

## 2021-08-05 DIAGNOSIS — Y900 Blood alcohol level of less than 20 mg/100 ml: Secondary | ICD-10-CM | POA: Insufficient documentation

## 2021-08-05 DIAGNOSIS — T1491XA Suicide attempt, initial encounter: Secondary | ICD-10-CM

## 2021-08-05 DIAGNOSIS — Z046 Encounter for general psychiatric examination, requested by authority: Secondary | ICD-10-CM | POA: Diagnosis present

## 2021-08-05 LAB — CBC WITH DIFFERENTIAL/PLATELET
Abs Immature Granulocytes: 0.02 10*3/uL (ref 0.00–0.07)
Basophils Absolute: 0 10*3/uL (ref 0.0–0.1)
Basophils Relative: 1 %
Eosinophils Absolute: 0 10*3/uL (ref 0.0–0.5)
Eosinophils Relative: 0 %
HCT: 44.5 % (ref 39.0–52.0)
Hemoglobin: 14.6 g/dL (ref 13.0–17.0)
Immature Granulocytes: 0 %
Lymphocytes Relative: 22 %
Lymphs Abs: 1.4 10*3/uL (ref 0.7–4.0)
MCH: 28.9 pg (ref 26.0–34.0)
MCHC: 32.8 g/dL (ref 30.0–36.0)
MCV: 87.9 fL (ref 80.0–100.0)
Monocytes Absolute: 0.8 10*3/uL (ref 0.1–1.0)
Monocytes Relative: 12 %
Neutro Abs: 4.3 10*3/uL (ref 1.7–7.7)
Neutrophils Relative %: 65 %
Platelets: 271 10*3/uL (ref 150–400)
RBC: 5.06 MIL/uL (ref 4.22–5.81)
RDW: 12.3 % (ref 11.5–15.5)
WBC: 6.5 10*3/uL (ref 4.0–10.5)
nRBC: 0 % (ref 0.0–0.2)

## 2021-08-05 LAB — COMPREHENSIVE METABOLIC PANEL
ALT: 19 U/L (ref 0–44)
AST: 39 U/L (ref 15–41)
Albumin: 4.5 g/dL (ref 3.5–5.0)
Alkaline Phosphatase: 60 U/L (ref 38–126)
Anion gap: 8 (ref 5–15)
BUN: 17 mg/dL (ref 6–20)
CO2: 26 mmol/L (ref 22–32)
Calcium: 9.1 mg/dL (ref 8.9–10.3)
Chloride: 103 mmol/L (ref 98–111)
Creatinine, Ser: 1 mg/dL (ref 0.61–1.24)
GFR, Estimated: >60 mL/min (ref >60)
Glucose, Bld: 99 mg/dL (ref 70–99)
Potassium: 4.1 mmol/L (ref 3.5–5.1)
Sodium: 137 mmol/L (ref 135–145)
Total Bilirubin: 1.6 mg/dL — ABNORMAL HIGH (ref 0.3–1.2)
Total Protein: 7.9 g/dL (ref 6.5–8.1)

## 2021-08-05 LAB — RAPID URINE DRUG SCREEN, HOSP PERFORMED
Amphetamines: NOT DETECTED
Barbiturates: NOT DETECTED
Benzodiazepines: NOT DETECTED
Cocaine: NOT DETECTED
Opiates: NOT DETECTED
Tetrahydrocannabinol: POSITIVE — AB

## 2021-08-05 LAB — SALICYLATE LEVEL: Salicylate Lvl: <7 mg/dL — ABNORMAL LOW (ref 7.0–30.0)

## 2021-08-05 LAB — RESP PANEL BY RT-PCR (FLU A&B, COVID) ARPGX2
Influenza A by PCR: NEGATIVE
Influenza B by PCR: NEGATIVE
SARS Coronavirus 2 by RT PCR: NEGATIVE

## 2021-08-05 LAB — ETHANOL: Alcohol, Ethyl (B): <10 mg/dL (ref <10)

## 2021-08-05 LAB — ACETAMINOPHEN LEVEL: Acetaminophen (Tylenol), Serum: <10 ug/mL — ABNORMAL LOW (ref 10–30)

## 2021-08-05 NOTE — ED Provider Triage Note (Signed)
Emergency Medicine Provider Triage Evaluation Note  Yahye Siebert , a 21 y.o. male  was evaluated in triage.  Pt arrives via EMS after supposedly suicide attempt.  Bystanders state that he was getting ready to jump off a parking deck and called Patent examiner and EMS.  Patient endorses marijuana use to me.  He does mention suicidal thoughts.  Review of Systems  Positive:  Negative: See above   Physical Exam  BP 138/86    Pulse 93    Temp 98.6 F (37 C) (Oral)    Resp 18    Ht 5\' 8"  (1.727 m)    Wt 63.5 kg    BMI 21.29 kg/m  Gen:   Awake, no distress, uncooperative Resp:  Normal effort  MSK:   Moves extremities without difficulty  Other:    Medical Decision Making  Medically screening exam initiated at 8:21 PM.  Appropriate orders placed.  Izick Gasbarro was informed that the remainder of the evaluation will be completed by another provider, this initial triage assessment does not replace that evaluation, and the importance of remaining in the ED until their evaluation is complete.     [name] [place], [place] 08/05/21 2022
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
## 2021-08-05 NOTE — ED Notes (Signed)
Called lab, urine rapid is being added now

## 2021-08-05 NOTE — ED Notes (Signed)
Patient given a sandwich and juice. He was also given warm blanket.

## 2021-08-05 NOTE — ED Provider Notes (Signed)
[facility]-EMERGENCY DEPT Provider Note   CSN: [id number] Arrival date & time: 08/05/21  2003     History  Chief Complaint  Patient presents with   Suicide Attempt    Jerry Garrison is a 21 y.o. male.  Patient is a 21 year old male with a history of schizophrenia who is being brought in by the police tonight for suicidal thoughts and a suicide attempt.  Patient was in a parking garage on a higher level and he reported to bystanders that he was going to jump off the roof.  When asking the patient he reports that he is still having suicidal thoughts.  He states he is very impressionable and he was making bad choices.  He does admit to using methamphetamine yesterday but denies any cocaine, heroin use.  He denies using any alcohol today.  He reports he supposed to be on medications but he is not taking them currently.  He states he just needs to get himself right and he needs to change stuff about himself on the inside.  The history is provided by the patient.      Home Medications Prior to Admission medications   Medication Sig Start Date End Date Taking? Authorizing Provider  benztropine (COGENTIN) 1 MG tablet Take 1 mg by mouth at bedtime. Patient not taking: Reported on 04/04/2021 02/27/21   [provider]  OLANZapine (ZYPREXA) 15 MG tablet Take 15 mg by mouth at bedtime. Patient not taking: Reported on 04/04/2021 02/27/21   [provider]      Allergies    Patient has no known allergies.    Review of Systems   Review of Systems  Physical Exam Updated Vital Signs BP 132/86 (BP Location: Left Arm)    Pulse 89    Temp 98.1 F (36.7 C) (Oral)    Resp 16    Ht 5\' 8"  (1.727 m)    Wt 63.5 kg    SpO2 98%    BMI 21.29 kg/m  Physical Exam Vitals and nursing note reviewed.  Constitutional:      General: He is not in acute distress.    Appearance: He is well-developed.  HENT:     Head: Normocephalic and atraumatic.  Eyes:      Conjunctiva/sclera: Conjunctivae normal.     Pupils: Pupils are equal, round, and reactive to light.  Cardiovascular:     Rate and Rhythm: Normal rate and regular rhythm.     Heart sounds: No murmur heard. Pulmonary:     Effort: Pulmonary effort is normal. No respiratory distress.     Breath sounds: Normal breath sounds. No wheezing or rales.  Abdominal:     General: There is no distension.     Palpations: Abdomen is soft.     Tenderness: There is no abdominal tenderness. There is no guarding or rebound.  Musculoskeletal:        General: No tenderness. Normal range of motion.     Cervical back: Normal range of motion and neck supple.  Skin:    General: Skin is warm and dry.     Findings: No erythema or rash.  Neurological:     Mental Status: He is alert and oriented to person, place, and time. Mental status is at baseline.  Psychiatric:        Behavior: Behavior normal.        Thought Content: Thought content includes suicidal ideation. Thought content includes suicidal plan.    ED Results / Procedures / Treatments  Labs (all labs ordered are listed, but only abnormal results are displayed) Labs Reviewed  RAPID URINE DRUG SCREEN, HOSP PERFORMED - Abnormal; Notable for the following components:      Result Value   Tetrahydrocannabinol POSITIVE (*)    All other components within normal limits  SALICYLATE LEVEL - Abnormal; Notable for the following components:   Salicylate Lvl <7.0 (*)    All other components within normal limits  ACETAMINOPHEN LEVEL - Abnormal; Notable for the following components:   Acetaminophen (Tylenol), Serum <10 (*)    All other components within normal limits  COMPREHENSIVE METABOLIC PANEL - Abnormal; Notable for the following components:   Total Bilirubin 1.6 (*)    All other components within normal limits  RESP PANEL BY RT-PCR (FLU A&B, COVID) [id number]  ETHANOL  CBC WITH DIFFERENTIAL/PLATELET    EKG EKG Interpretation  Date/Time:  Tuesday  August 05 2021 20:29:23 EST Ventricular Rate:  100 PR Interval:  147 QRS Duration: 89 QT Interval:  312 QTC Calculation: 403 R Axis:   66 Text Interpretation: Sinus tachycardia RSR' in V1 or V2, probably normal variant Probable left ventricular hypertrophy Baseline wander in lead(s) II III aVF No significant change since last tracing Confirmed by Gwyneth Sprout ([id number]) on 08/05/2021 9:30:55 PM  Radiology No results found.  Procedures Procedures    Medications Ordered in ED Medications - No data to display  ED Course/ Medical Decision Making/ A&P                           Medical Decision Making Amount and/or Complexity of Data Reviewed Independent Historian: EMS External Data Reviewed: notes. Labs: ordered. Decision-making details documented in ED Course. ECG/medicine tests: ordered and independent interpretation performed. Decision-making details documented in ED Course.   Patient is a 21 year old male with a history of mental illness who is cannot currently on medication presenting today after being found in a parking garage threatening to commit suicide by jumping.  Patient is still endorsing that he has suicidal ideation.  He is somewhat tangential and responding to internal stimuli on exam.  He is calm and cooperative at this time and endorses wanting to speak with mental health.  I independently interpreted patient's labs and they are all normal including CBC, CMP, EtOH and salicylate levels.  His UDS is positive for only marijuana.  Patient denies drinking any alcohol today.  I independently interpreted patient's EKG which is normal.  Will consult TTS to see the patient.  Additional history was obtained by police and EMS.  External medical records from patient's prior psychiatric evaluations were reviewed        Final Clinical Impression(s) / ED Diagnoses Final diagnoses:  Suicide attempt [facility])  Suicidal ideation    Rx / DC Orders ED Discharge Orders     None          Gwyneth Sprout, MD 08/05/21 2135

## 2021-08-05 NOTE — ED Notes (Addendum)
Pt belongings are in cabinet in 19-22. Pts shirt, underwear, pants, white shoes.  Pt is dressed out. Pt was wanded

## 2021-08-06 MED ORDER — ZIPRASIDONE MESYLATE 20 MG IM SOLR: 20.0000 mg | Freq: Once | INTRAMUSCULAR | Status: DC

## 2021-08-06 MED ORDER — BENZTROPINE MESYLATE 1 MG PO TABS
1.0000 mg | ORAL_TABLET | Freq: Every day | ORAL | Status: DC
  Administered 2021-08-06: 1 mg via ORAL
  Filled 2021-08-06: qty 1

## 2021-08-06 MED ORDER — OLANZAPINE 5 MG PO TABS
15.0000 mg | ORAL_TABLET | Freq: Every day | ORAL | Status: DC
  Administered 2021-08-06: 15 mg via ORAL
  Filled 2021-08-06: qty 1

## 2021-08-06 NOTE — BH Assessment (Signed)
[facility] Assessment Progress Note   Per Nira Conn, NP, this pt requires psychiatric hospitalization.  Linsey, RN, [facility] has assigned pt to [facility] Rm 507-2 to the service of Dr Sherron Flemings.  [facility] will be ready to receive pt between 21:30 and 22:00.  Pt presents under IVC initiated by a BHRT counselr, and upheld by EDP Gwyneth Sprout, MD, and IVC documents have been sent to [facility].  EDP Derwood Kaplan, MD and pt's nurse, Casimiro Needle, have been notified, and Casimiro Needle agrees to call report to [phone].  Pt is to be transported via Patent examiner.   Doylene Canning, [place] Behavioral Health Coordinator [phone]

## 2021-08-06 NOTE — BH Assessment (Signed)
TTS spoke to [facility], (Per [employer]) to set up TTS cart for pt.  Clinician to call the  cart in ten minutes.

## 2021-08-06 NOTE — ED Notes (Signed)
Pt walking around the nurses station; standing at desk looking at staff; pt asked if he needed anything; pt then went to bed and laid down

## 2021-08-06 NOTE — ED Provider Notes (Signed)
Emergency Medicine Observation Re-evaluation Note  Jerry Garrison is a 21 y.o. male, seen on rounds today.  Pt initially presented to the ED for complaints of Suicide Attempt Currently, the patient is resting comfortably, asleep.  Physical Exam  BP 125/82 (BP Location: Right Arm)    Pulse 70    Temp 98.3 F (36.8 C) (Oral)    Resp 16    Ht 5\' 8"  (1.727 m)    Wt 63.5 kg    SpO2 99%    BMI 21.29 kg/m  Physical Exam General: No acute distress Lungs: No respiratory distress Psych: Asleep, cooperative  ED Course / MDM  EKG:EKG Interpretation  Date/Time:  Tuesday August 05 2021 20:29:23 EST Ventricular Rate:  100 PR Interval:  147 QRS Duration: 89 QT Interval:  312 QTC Calculation: 403 R Axis:   66 Text Interpretation: Sinus tachycardia RSR' in V1 or V2, probably normal variant Probable left ventricular hypertrophy Baseline wander in lead(s) II III aVF No significant change since last tracing Confirmed by 1981 (Gwyneth Sprout) on 08/05/2021 9:30:55 PM  I have reviewed the labs performed to date as well as medications administered while in observation.  Recent changes in the last 24 hours include : None.  Plan  Current plan is for patient to be assessed by TTS, disposition pending.  Aaiden Depoy is not under involuntary commitment.     [name], MD 08/06/21 [phone]

## 2021-08-06 NOTE — ED Notes (Signed)
Accepted to [facility] to Dr Sherron Flemings.  Anticipated time to transfer is between 21:30 and 22:00, but they'll call if they can move it up.  IVC, will go via Patent examiner.  Please call nursing report to [phone].

## 2021-08-06 NOTE — ED Notes (Signed)
Patients mother for and update : Carvin Almas [phone]

## 2021-08-06 NOTE — BH Assessment (Addendum)
Comprehensive Clinical Assessment (CCA) Note  08/06/2021 Bowan Fowle NP:[id number]  Chief Complaint:  Chief Complaint  Patient presents with   Suicide Attempt   Visit Diagnosis:   F32.3 Major depressive disorder, Single episode, With psychotic features  Flowsheet Row ED from 08/05/2021 in [place] DEPT ED from 04/04/2021 in [place] ED from 01/01/2021 in [place] CATEGORY High Risk No Risk Error: Q3, 4, or 5 should not be populated when Q2 is No       High Risk=1:1 sitter  The patient demonstrates the following risk factors for suicide: Chronic risk factors for suicide include: psychiatric disorder of major depressive disorder with psychotic and previous suicide thoughts without a plan and substance use disorder. Acute risk factors for suicide include: unemployment, social withdrawal/isolation, and loss (financial, interpersonal, professional). Protective factors for this patient include: positive social support, responsibility to others (children, family), coping skills, hope for the future, and life satisfaction. Considering these factors, the overall suicide risk at this point appears to be high. Patient is not appropriate for outpatient follow up.  Disposition: Lindon Romp NP, patient meets inpatient criteria.  [facility] AC contacted and bed availability under review. Disposition discussed with IT trainer, via secure chat in [place].  RN to discuss disposition with EDP.  Ary Pere is a 21 year old male who presents voluntarily to [facility] via [facility] and unaccompanied. Pt reports SI with a plan to jump from parking garage, "I have had thoughts of suicide before, I didn't do it".  Pt reports a history of schizophrenia and delusional.  Pt reports, " I' am hearing  random voices, I am unable to describe the voices". Pt denies HI. Pt acknowledged symptoms including,  loss of interests in usual  pleasures, feeing hopelessness, fatigue, worrying, worthlessness, and isolation. Pt reports that he is sleeping excessively day and night, unable to report the amount of time.  Pt reports that he is eating once a day. Pt denies any paranoia.  Pt admits to using marijuana daily; also, reports smoking three blunts a day.  Pt reports that he used methamphetamines once, unable to specify time or date.  Pt denies drinking alcohol our using any other substance use.  Pt unable to identify a primary stressor.  Pt reports that he is living with a friend, "I am homeless, I cannot disclose who I am living with at this present time".  Pt reports that he is unemployed.  Pt denies family substance used; also, denies family mental illness. Pt reports verbal abuse, refused to disclose information as it relates to his condition.  Pt denies any current legal problems.  Pt denies any guns or weapon in the house.  Pt says he is currently not receiving weekly outpatient therapy; also is not receiving outpatient medication management. Pt reports one previous hospitalization in June 2022 at [facility] for Psychosis.  Pt is dressed in scrubs, alert, oriented x 4 with  stutter speech, and restless motor behavior.  Eye contact is avoided.  Pt mood is dysphoric, labile and worthless.  Pt affect is anxious.  Thought process is relevant. Pt's insight is lacking and judgment is impaired. Pt is not currently responding to internal stimuli or experiencing delusional thought  content.  Pt was cooperative throughout assessment.    CCA Screening, Triage and Referral (STR)  Patient Reported Information How did you hear about [place]? Legal System  What Is the Reason for Your Visit/Call Today? SI  How  Long Has This Been Causing You Problems? 1 wk - 1 month  What Do You Feel Would Help You the Most Today? Treatment for Depression or other mood problem; Alcohol or Drug Use Treatment   Have You Recently Had Any Thoughts About Hurting Yourself?  Yes  Are You Planning to Commit Suicide/Harm Yourself At This time? No   Have you Recently Had Thoughts About Goodnight? No  Are You Planning to Harm Someone at This Time? No  Explanation: No data recorded  Have You Used Any Alcohol or Drugs in the Past 24 Hours? Yes  How Long Ago Did You Use Drugs or Alcohol? No data recorded What Did You Use and How Much? Marijuana, 3 blunts; also reports using Meth   Do You Currently Have a Therapist/Psychiatrist? No  Name of Therapist/Psychiatrist: No data recorded  Have You Been Recently Discharged From Any Office Practice or Programs? No  Explanation of Discharge From Practice/Program: Discharged from [facility] 01/12/2021     CCA Screening Triage Referral Assessment Type of Contact: Tele-Assessment  Telemedicine Service Delivery: Telemedicine service delivery: This service was provided via telemedicine using a 2-way, interactive audio and video technology  Is this Initial or Reassessment? Initial Assessment  Date Telepsych consult ordered in [facility]:  08/06/21  Time Telepsych consult ordered in [facility]:  1259  Location of Assessment: [facility] ED  Provider Location: [facility] Assessment Services   Collateral Involvement: No collaeral involved.   Does Patient Have a Stage manager Guardian? No data recorded Name and Contact of Legal Guardian: No data recorded If Minor and Not Living with Parent(s), Who has Custody? n/a  Is CPS involved or ever been involved? Never  Is APS involved or ever been involved? Never   Patient Determined To Be At Risk for Harm To Self or Others Based on Review of Patient Reported Information or Presenting Complaint? Yes, for Self-Harm  Method: No data recorded Availability of Means: No data recorded Intent: No data recorded Notification Required: No data recorded Additional Information for Danger to Others Potential: No data recorded Additional Comments for Danger to Others Potential: No  data recorded Are There Guns or Other Weapons in Your Home? No data recorded Types of Guns/Weapons: No data recorded Are These Weapons Safely Secured?                            No data recorded Who Could Verify You Are Able To Have These Secured: No data recorded Do You Have any Outstanding Charges, Pending Court Dates, Parole/Probation? No data recorded Contacted To Inform of Risk of Harm To Self or Others: Law Enforcement    Does Patient Present under Involuntary Commitment? No  IVC Papers Initial File Date: 04/04/21   [place] of Residence: [place]   Patient Currently Receiving the Following Services: Medication Management; CD--IOP (Intensive Chemical Dependency Program); Individual Therapy   Determination of Need: Urgent (48 hours)   Options For Referral: [facility]     CCA Biopsychosocial Patient Reported Schizophrenia/Schizoaffective Diagnosis in Past: Yes   Strengths: UTA   Mental Health Symptoms Depression:   Hopelessness; Worthlessness; Sleep (too much or little) (Pt reports that he sleeps excessively day and night.)   Duration of Depressive symptoms:    Mania:   Recklessness   Anxiety:    Difficulty concentrating; Fatigue; Restlessness; Worrying   Psychosis:   Hallucinations   Duration of Psychotic symptoms:  Duration of Psychotic Symptoms: Less than  six months   Trauma:   None (UTA)   Obsessions:   Disrupts routine/functioning; Recurrent & persistent thoughts/impulses/images; Poor insight   Compulsions:   "Driven" to perform behaviors/acts; Repeated behaviors/mental acts; Poor Insight   Inattention:   None   Hyperactivity/Impulsivity:   None   Oppositional/Defiant Behaviors:   None   Emotional Irregularity:   Chronic feelings of emptiness; Recurrent suicidal behaviors/gestures/threats   Other Mood/Personality Symptoms:   depressed    Mental Status Exam Appearance and self-care  Stature:   Average   Weight:   Average  weight   Clothing:   -- (Pt drerssed ins scrubs.)   Grooming:   Normal   Cosmetic use:   Age appropriate   Posture/gait:   Normal   Motor activity:   Restless   Sensorium  Attention:   Distractible   Concentration:   Focuses on irrelevancies   Orientation:   Object; Person; Place; Situation   Recall/memory:   Normal   Affect and Mood  Affect:   Anxious   Mood:   Dysphoric; Worthless; Hopeless   Relating  Eye contact:   Avoided   Facial expression:   Responsive; Anxious   Attitude toward examiner:   Cooperative   Thought and Language  Speech flow:  Clear and Coherent   Thought content:   Ideas of Influence; Suspicious   Preoccupation:   Guilt   Hallucinations:   Auditory   Organization:  No data recorded  Affiliated Computer ServicesExecutive Functions  Fund of Knowledge:   Fair   Intelligence:   Average   Abstraction:   Abstract   Judgement:   Impaired   Reality Testing:   Distorted   Insight:   Lacking   Decision Making:   Impulsive   Social Functioning  Social Maturity:   Impulsive   Social Judgement:   Impropriety; Naive   Stress  Stressors:   Housing   Coping Ability:   Overwhelmed; Deficient supports   Skill Deficits:   Decision making; Self-care   Supports:   Support needed     Religion: Religion/Spirituality Are You A Religious Person?:  (UTA) How Might This Affect Treatment?: UTA  Leisure/Recreation: Leisure / Recreation Do You Have Hobbies?: No  Exercise/Diet: Exercise/Diet Do You Exercise?: No Have You Gained or Lost A Significant Amount of Weight in the Past Six Months?: No Do You Follow a Special Diet?: No Do You Have Any Trouble Sleeping?: Yes Explanation of Sleeping Difficulties: Pt reports that he sleeps excessivey night and day.   CCA Employment/Education Employment/Work Situation: Employment / Work Situation Employment Situation: Unemployed Patient's Job has Been Impacted by Current Illness: No Has  Patient ever Been in Equities traderthe Military?: No  Education: Education Is Patient Currently Attending School?: No Last Grade Completed: 12 Did You Product managerAttend College?: Yes What Type of College Degree Do you Have?: UTA Did You Have An Individualized Education Program (IIEP): No Did You Have Any Difficulty At School?: No Patient's Education Has Been Impacted by Current Illness: No   CCA Family/Childhood History Family and Relationship History: Family history Marital status: Single How many children?: 1 How is patient's relationship with their children?: distant relationship  Childhood History:  Childhood History By whom was/is the patient raised?: Mother Did patient suffer from severe childhood neglect?: Yes Patient description of severe childhood neglect: UTA Has patient ever been sexually abused/assaulted/raped as an adolescent or adult?: No Was the patient ever a victim of a crime or a disaster?: No Witnessed domestic violence?: No Has patient been affected  by domestic violence as an adult?: No  Child/Adolescent Assessment:     CCA Substance Use Alcohol/Drug Use: Alcohol / Drug Use Pain Medications: SEE MAR Prescriptions: SEE MAR Over the Counter: SEE MAR History of alcohol / drug use?: Yes (Pt reports he has tried marijuana in the past.) Longest period of sobriety (when/how long): no sobriety Negative Consequences of Use: Financial Withdrawal Symptoms: Agitation Substance #1 Name of Substance 1: Marijuana 1 - Age of First Use: 12 1 - Amount (size/oz): 3 blunts 1 - Frequency: daily 1 - Duration: ongoing 1 - Last Use / Amount: 08/05/21 1 - Method of Aquiring: UTA 1- Route of Use: smoking Substance #2 Name of Substance 2: Methamphetaines 2 - Age of First Use: UTA 2 - Amount (size/oz): UTA 2 - Frequency: UTA 2 - Duration: UTA 2 - Last Use / Amount: UTA 2 - Method of Aquiring: UTA 2 - Route of Substance Use: UTA                     ASAM's:  Six Dimensions of  Multidimensional Assessment  Dimension 1:  Acute Intoxication and/or Withdrawal Potential:   Dimension 1:  Description of individual's past and current experiences of substance use and withdrawal: Pt reports that he smoke marijuana daily; also started at age 51.  Dimension 2:  Biomedical Conditions and Complications:   Dimension 2:  Description of patient's biomedical conditions and  complications: Pt did not report biomedical conditons  Dimension 3:  Emotional, Behavioral, or Cognitive Conditions and Complications:  Dimension 3:  Description of emotional, behavioral, or cognitive conditions and complications: Schizophrenia  Dimension 4:  Readiness to Change:  Dimension 4:  Description of Readiness to Change criteria: precontmplation  Dimension 5:  Relapse, Continued use, or Continued Problem Potential:  Dimension 5:  Relapse, continued use, or continued problem potential critiera description: continued used  Dimension 6:  Recovery/Living Environment:  Dimension 6:  Recovery/Iiving environment criteria description: Pt reports that "I am unable to reveal where I live, I live in a safe place, I need a place to live on my own".  ASAM Severity Score: ASAM's Severity Rating Score: 15  ASAM Recommended Level of Treatment: ASAM Recommended Level of Treatment: Level I Outpatient Treatment   Substance use Disorder (SUD) Substance Use Disorder (SUD)  Checklist Symptoms of Substance Use: Continued use despite having a persistent/recurrent physical/psychological problem caused/exacerbated by use, Continued use despite persistent or recurrent social, interpersonal problems, caused or exacerbated by use, Large amounts of time spent to obtain, use or recover from the substance(s), Presence of craving or strong urge to use, Social, occupational, recreational activities given up or reduced due to use, Substance(s) often taken in larger amounts or over longer times than was intended  Recommendations for  Services/Supports/Treatments: Recommendations for Services/Supports/Treatments Recommendations For Services/Supports/Treatments: Inpatient Hospitalization  Discharge Disposition:    DSM5 Diagnoses: Patient Active Problem List   Diagnosis Date Noted   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

## 2021-08-07 ENCOUNTER — Inpatient Hospital Stay (HOSPITAL_COMMUNITY)
Admission: AD | Admit: 2021-08-07 | Discharge: 2021-08-15 | DRG: 885 | Disposition: A | Discharge status: Home / Self Care | Payer: Federal, State, Local not specified - Other | Source: Intra-hospital | Attending: Psychiatry | Admitting: Psychiatry

## 2021-08-07 ENCOUNTER — Encounter (HOSPITAL_COMMUNITY): Payer: Self-pay | Admitting: Family

## 2021-08-07 DIAGNOSIS — F209 Schizophrenia, unspecified: Secondary | ICD-10-CM | POA: Diagnosis present

## 2021-08-07 DIAGNOSIS — Z91199 Patient's noncompliance with other medical treatment and regimen due to unspecified reason: Secondary | ICD-10-CM

## 2021-08-07 DIAGNOSIS — F201 Disorganized schizophrenia: Secondary | ICD-10-CM | POA: Diagnosis present

## 2021-08-07 DIAGNOSIS — G3184 Mild cognitive impairment, so stated: Secondary | ICD-10-CM | POA: Diagnosis present

## 2021-08-07 DIAGNOSIS — R45851 Suicidal ideations: Secondary | ICD-10-CM | POA: Diagnosis present

## 2021-08-07 DIAGNOSIS — F1210 Cannabis abuse, uncomplicated: Secondary | ICD-10-CM | POA: Diagnosis present

## 2021-08-07 DIAGNOSIS — Z20822 Contact with and (suspected) exposure to covid-19: Secondary | ICD-10-CM | POA: Diagnosis present

## 2021-08-07 LAB — LIPID PANEL
Cholesterol: 135 mg/dL (ref 0–200)
HDL: 52 mg/dL (ref >40)
LDL Cholesterol: 78 mg/dL (ref 0–99)
Total CHOL/HDL Ratio: 2.6 RATIO
Triglycerides: 26 mg/dL (ref <150)
VLDL: 5 mg/dL (ref 0–40)

## 2021-08-07 LAB — TSH: TSH: 1.866 u[IU]/mL (ref 0.350–4.500)

## 2021-08-07 LAB — HEMOGLOBIN A1C
Hgb A1c MFr Bld: 4.4 % — ABNORMAL LOW (ref 4.8–5.6)
Mean Plasma Glucose: 79.58 mg/dL

## 2021-08-07 MED ORDER — ACETAMINOPHEN 325 MG PO TABS: 650.0000 mg | ORAL_TABLET | Freq: Four times a day (QID) | ORAL | Status: DC | PRN

## 2021-08-07 MED ORDER — ZIPRASIDONE MESYLATE 20 MG IM SOLR: 20.0000 mg | INTRAMUSCULAR | Status: DC | PRN

## 2021-08-07 MED ORDER — HYDROXYZINE HCL 25 MG PO TABS: 25.0000 mg | ORAL_TABLET | Freq: Three times a day (TID) | ORAL | Status: DC | PRN

## 2021-08-07 MED ORDER — OLANZAPINE 5 MG PO TBDP: 5.0000 mg | ORAL_TABLET | Freq: Three times a day (TID) | ORAL | Status: DC | PRN

## 2021-08-07 MED ORDER — PALIPERIDONE ER 6 MG PO TB24
6.0000 mg | ORAL_TABLET | Freq: Every day | ORAL | Status: DC
  Administered 2021-08-07: 6 mg via ORAL
  Filled 2021-08-07 (×4): qty 1

## 2021-08-07 MED ORDER — OLANZAPINE 5 MG PO TABS
5.0000 mg | ORAL_TABLET | Freq: Every day | ORAL | Status: DC
  Administered 2021-08-07: 5 mg via ORAL
  Filled 2021-08-07 (×4): qty 1

## 2021-08-07 MED ORDER — ALUM & MAG HYDROXIDE-SIMETH 200-200-20 MG/5ML PO SUSP: 30.0000 mL | ORAL | Status: DC | PRN

## 2021-08-07 MED ORDER — OLANZAPINE 10 MG PO TBDP: 10.0000 mg | ORAL_TABLET | Freq: Three times a day (TID) | ORAL | Status: DC | PRN

## 2021-08-07 MED ORDER — MAGNESIUM HYDROXIDE 400 MG/5ML PO SUSP: 30.0000 mL | Freq: Every day | ORAL | Status: DC | PRN

## 2021-08-07 MED ORDER — TRAZODONE HCL 50 MG PO TABS: 50.0000 mg | ORAL_TABLET | Freq: Every evening | ORAL | Status: DC | PRN

## 2021-08-07 MED ORDER — OLANZAPINE 7.5 MG PO TABS
15.0000 mg | ORAL_TABLET | Freq: Every day | ORAL | Status: DC
  Filled 2021-08-07 (×2): qty 2

## 2021-08-07 MED ORDER — LORAZEPAM 1 MG PO TABS: 1.0000 mg | ORAL_TABLET | ORAL | Status: DC | PRN

## 2021-08-07 MED ORDER — ZIPRASIDONE MESYLATE 20 MG IM SOLR: 20.0000 mg | Freq: Two times a day (BID) | INTRAMUSCULAR | Status: DC | PRN

## 2021-08-07 MED ORDER — BENZTROPINE MESYLATE 1 MG PO TABS
1.0000 mg | ORAL_TABLET | Freq: Every day | ORAL | Status: DC
  Filled 2021-08-07 (×2): qty 1

## 2021-08-07 NOTE — Progress Notes (Signed)
Recreation Therapy Notes  INPATIENT RECREATION THERAPY ASSESSMENT  Patient Details Name: Dasaun Weinstein MRN: [id number] DOB: 2001/01/22 Today's Date: 08/07/2021       Information Obtained From: Patient  Able to Participate in Assessment/Interview: Yes (Pt stated he was going to say no to each question, which he did. Pt did deny SI/HI and AVH.)  Patient Presentation: Alert  Reason for Admission (Per Patient): Suicidal Ideation  Patient Stressors: Other (Comment) (Chasing women)  Coping Skills:    (None identified)  Leisure Interests (2+):   (would not answer)  Frequency of Recreation/Participation:    Awareness of Community Resources:   (would not answer)  Expressed Interest in [place]:  (would not answer)  [place] of Residence:  did not answer; chart says [place]  Patient Main Form of Transportation:  (would not answer)  Patient Strengths:  would not answer  Patient Identified Areas of Improvement:  would not answer  Patient Goal for Hospitalization:  would not answer  Current SI (including self-harm):  No  Current HI:  No  Current AVH: No  Staff Intervention Plan: Group Attendance, Collaborate with Interdisciplinary Treatment Team  Consent to Intern Participation: N/A   Victorino Sparrow, Vickki Muff, [place] 08/07/2021, 12:24 PM

## 2021-08-07 NOTE — Progress Notes (Signed)
°   08/07/21 0500  Sleep  Number of Hours 3.75

## 2021-08-07 NOTE — Progress Notes (Signed)
Pt was encouraged but didn't attend orientation/goals group. ?

## 2021-08-07 NOTE — [facility] Group Notes (Signed)
[facility] Group Notes:  (Nursing/MHT/Case Management/Adjunct)  Date:  08/07/2021  Time:  4:40 PM  Type of Therapy:   Therapeutic Relaxation group  Participation Level:  Active  Participation Quality:  Appropriate  Affect:  Appropriate  Cognitive:  Appropriate  Insight:  Appropriate  Engagement in Group:  Engaged  Modes of Intervention:  Activity, Exploration, and Support  Summary of Progress/Problems: Group consisted of listening to relaxing nature sounds and deep breathing exercises along with positive affirmations/imagery. This combination has been known to help relieve stress in the mind and body.  Isamu Trammel J Jalesha Plotz 08/07/2021, 4:40 PM
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
## 2021-08-07 NOTE — Progress Notes (Signed)
Patient ID: Jerry Garrison, male   DOB: Jan 02, 2001, 21 y.o.   MRN: [id number] Admission Note:  D:21 yr male who presents IVC in no acute distress for the treatment of SI and psychosis. Pt appears flat and depressed. Pt was calm with admission process. Pt appeared to be responding to internal stimuli due to delayed responses and possible thought blocking at times. Pt appeared to be having trouble making up his mind at times, pt refused to sign the paperwork, but during the admission pt stated he wanted to sign the paperwork, it was if something or someone was telling him not to sign the paperwork, even though pt denied AVH . Pt was not forthcoming with a lot of information, pt stated "my mind was playing tricks on me, and I was going with the flow of things " as to why he was here.   Per Assessment: presents voluntarily to [facility] via [facility] and unaccompanied. Pt reports SI with a plan to jump from parking garage, "I have had thoughts of suicide before, I didn't do it".  Pt reports a history of schizophrenia and delusional.  Pt reports, " I' am hearing  random voices, I am unable to describe the voices". Pt denies HI.Patient was in a parking garage on a higher level and he reported to bystanders that he was going to jump off the roof.  When asking the patient he reports that he is still having suicidal thoughts.  He states he is very impressionable and he was making bad choices.  He does admit to using methamphetamine yesterday but denies any cocaine, heroin use.  He denies using any alcohol today.  He reports he supposed to be on medications but he is not taking them currently.  He states he just needs to get himself right and he needs to change stuff about himself on the inside.Pt acknowledged symptoms including,  loss of interests in usual pleasures, feeing hopelessness, fatigue, worrying, worthlessness, and isolation. Pt reports that he is sleeping excessively day and night, unable to report the amount of time.  Pt  reports that he is eating once a day. Pt denies any paranoia.  Pt admits to using marijuana daily; also, reports smoking three blunts a day.  Pt reports that he used methamphetamines once, unable to specify time or date.  Pt denies drinking alcohol our using any other substance use.   Pt unable to identify a primary stressor.  Pt reports that he is living with a friend, "I am homeless, I cannot disclose who I am living with at this present time".  Pt reports that he is unemployed.  Pt denies family substance used; also, denies family mental illness.  A: Skin was assessed and found to be clear of any abnormal marks apart from a tattoo on R-forehead area. PT searched and no contraband found, POC and unit policies explained and understanding verbalized. Consents not obtained due to pt refusing to sing paperwork. Food and fluids offered, and  accepted.   R:Pt had no additional questions or concerns.

## 2021-08-07 NOTE — Tx Team (Signed)
Initial Treatment Plan 08/07/2021 2:50 AM Jerry Garrison [id number]    PATIENT STRESSORS: Marital or family conflict   Medication change or noncompliance     PATIENT STRENGTHS: General fund of knowledge  Motivation for treatment/growth    PATIENT IDENTIFIED PROBLEMS: psychosis  Risk for SI  "Social skills, job"                 DISCHARGE CRITERIA:  Improved stabilization in mood, thinking, and/or behavior Verbal commitment to aftercare and medication compliance  PRELIMINARY DISCHARGE PLAN: Attend PHP/IOP Outpatient therapy  PATIENT/FAMILY INVOLVEMENT: This treatment plan has been presented to and reviewed with the patient, Jerry Garrison.  The patient and family have been given the opportunity to ask questions and make suggestions.  Providence Crosby, RN 08/07/2021, 2:50 AM

## 2021-08-07 NOTE — ED Notes (Signed)
Called and spoke with [employer] regarding transport for pt
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
## 2021-08-07 NOTE — BHH Counselor (Signed)
Adult Comprehensive Assessment  Patient ID: Jerry Garrison, male   DOB: 2000/10/16, 21 y.o.   MRN: [id number]  Information Source: Information source: Patient  Current Stressors:  Patient states their primary concerns and needs for treatment are:: Patient states that he has been feeling down since he was 21 years old. Patient states he has a history of psychosis Patient states their goals for this hospitilization and ongoing recovery are:: Patient states that he would like to have better social skills. 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 Physical health (include injuries & life threatening diseases): No stressors Social relationships: Patient states that he has friends but was unable to specify who his friends are Substance abuse: Patient recent quit using marijuana, no other substances reported Bereavement / Loss: No stressors  Living/Environment/Situation:  Living Arrangements: Alone, Other (Comment) (Homeless) Living conditions (as described by patient or guardian): Patient states that they are couch surfing Who else lives in the home?: no one How long has patient lived in current situation?: 1 month What is atmosphere in current home: Temporary  Family History:  Marital status: Single Are you sexually active?: No What is your sexual orientation?: Straight Has your sexual activity been affected by drugs, alcohol, medication, or emotional stress?: no Does patient have children?: Yes How many children?: 1 How is patient's relationship with their children?: distant relationship, 40 year old  Childhood History:  By whom was/is the patient raised?: Mother Additional childhood history information: Patient  states that he had a great childhood and had lots of siblings. Patient states that he was free to do what he wants Description of patient's relationship with caregiver when they were a child: I think it was good Patient's description of current relationship with people who raised him/her: shaky How were you disciplined when you got in trouble as a child/adolescent?: Patient states that he rarely was in trouble- wasn't disciplined much Does patient have siblings?: Yes Number of Siblings: 9 Description of patient's current relationship with siblings: patient reports that his relationship right now is distant with siblings Did patient suffer any verbal/emotional/physical/sexual abuse as a child?: No Did patient suffer from severe childhood neglect?: No Patient description of severe childhood neglect: UTA Has patient ever been sexually abused/assaulted/raped as an adolescent or adult?: No Was the patient ever a victim of a crime or a disaster?: No Witnessed domestic violence?: No Has patient been affected by domestic violence as an adult?: No  Education:  Highest grade of school patient has completed: patient reports finishing 1 year of college at [employer] in [place] Currently a Ship broker?: No Learning disability?: Yes What learning problems does patient have?: Patient states that he had psychosis  Employment/Work Situation:   Employment Situation: Unemployed Patient's Job has Been Impacted by Current Illness: No What is the Longest Time Patient has Held a Job?: 2 years Where was the Patient Employed at that Time?: [employer] Has Patient ever Been in the [employer]?: No  Financial Resources:   Financial resources: No income Does patient have a Programmer, applications or guardian?: No  Alcohol/Substance Abuse:   What has been your use of drugs/alcohol within the last 12 months?: marijuana If attempted suicide, did drugs/alcohol play a role in this?: No Alcohol/Substance Abuse Treatment Hx: Denies  past history  If yes, describe treatment: none Has alcohol/substance abuse ever caused legal problems?: No  Social Support System:   Heritage manager System: Poor (patient states that he has friends/family but unable to specify who those people are) Describe Community Support System: unable to tell this patient Type of faith/religion: none How does patient's faith help to cope with current illness?: none  Leisure/Recreation:   Do You Have Hobbies?: Yes Leisure and Hobbies: Music, both listening to and making  Strengths/Needs:   What is the patient's perception of their strengths?: patient reports that he has patience Patient states they can use these personal strengths during their treatment to contribute to their recovery: yes Patient states these barriers may affect/interfere with their treatment: homelessness Patient states these barriers may affect their return to the community: none Other important information patient would like considered in planning for their treatment: none  Discharge Plan:   Currently receiving community mental health services: No Patient states concerns and preferences for aftercare planning are: none Patient states they will know when they are safe and ready for discharge when: patient states "I don't know" Does patient have access to transportation?: No Does patient have financial barriers related to discharge medications?: No Plan for no access to transportation at discharge: CSW will continue to assess Will patient be returning to same living situation after discharge?: Yes Designer, industrial/product, patient plans to get a job so that he can start living independently)  Summary/Recommendations:   Summary and Recommendations (to be completed by the evaluator): Jerry Garrison is a 21 year old male who presents to [facility] for suicidal ideation with a plan. Patient reports that he has been feeling down since he was 12. He has a history of psychosis.  Patient reports that  he currently is homeless and is couch surfing.  He was living with his mother about a month ago but left due to a "shaky" relationship. Patient reports that he needs assistance with social skills and reports a distant relationship with all of his family.  Patient was unable to identify social supports.  Patient is not connected to community mental health. While here, Jeremias can benefit from crisis stabilization, medication management, therapeutic milieu, and referrals for services.  Payne Garske E Davie Claud. 08/07/2021

## 2021-08-07 NOTE — Group Note (Signed)
Recreation Therapy Group Note   Group Topic:Personal Development  Group Date: 08/07/2021 Start Time: 1000 End Time: 1050 Facilitators: Victorino Sparrow, LRT,CTRS Location: [address] Dayroom   Goal Area(s) Addresses:  Patient will use appropriate interactions in play with peers.   Patient will follow directions on first prompt.  Group Description:  Anxiety.  LRT and patients discussed what anxiety is and how it affects each person.  Patients were then given a worksheet to identify triggers, physical symptoms, thoughts and coping skills for anxiety.    Affect/Mood: Flat   Participation Level: Moderate   Participation Quality: Independent   Behavior: Attentive    Speech/Thought Process: Focused   Insight: Good   Judgement: Good   Modes of Intervention: Music and Worksheet   Patient Response to Interventions:  Attentive   Education Outcome:  Acknowledges education and In group clarification offered    Clinical Observations/Individualized Feedback: Pt was quiet and seemed reserved.  Pt would engage when prompted.  Pt was flat throughout group.  Pt identified triggers to anxiety as invasion of space, confusion and being hurried.  Pt has symptoms of a fast heartbeat, dizziness and heavy breathing.  Pt explained having the thought of "what ?".  Pt copes with anxiety through music, water and walking.    Plan: Continue to engage patient in RT group sessions 2-3x/week.   Victorino Sparrow, LRT,CTRS 08/07/2021 12:01 PM

## 2021-08-07 NOTE — ED Notes (Signed)
Report given to [facility] at [facility]

## 2021-08-07 NOTE — BHH Group Notes (Signed)
Pt didn't attend group. 

## 2021-08-07 NOTE — Plan of Care (Signed)
°  Problem: Coping: Goal: Ability to verbalize frustrations and anger appropriately will improve Outcome: Progressing   Problem: Safety: Goal: Periods of time without injury will increase Outcome: Progressing   Problem: Activity: Goal: Will verbalize the importance of balancing activity with adequate rest periods Outcome: Progressing   Problem: Coping: Goal: Coping ability will improve Outcome: Progressing

## 2021-08-08 ENCOUNTER — Encounter (HOSPITAL_COMMUNITY): Payer: Self-pay

## 2021-08-08 MED ORDER — OLANZAPINE 10 MG PO TABS
10.0000 mg | ORAL_TABLET | Freq: Every day | ORAL | Status: DC
  Administered 2021-08-08 – 2021-08-14 (×7): 10 mg via ORAL
  Filled 2021-08-08 (×8): qty 1

## 2021-08-08 MED ORDER — PALIPERIDONE ER 6 MG PO TB24
6.0000 mg | ORAL_TABLET | Freq: Every day | ORAL | Status: AC
  Administered 2021-08-08: 6 mg via ORAL
  Filled 2021-08-08: qty 1

## 2021-08-08 MED ORDER — PALIPERIDONE ER 6 MG PO TB24
9.0000 mg | ORAL_TABLET | Freq: Every day | ORAL | Status: AC
  Administered 2021-08-09 – 2021-08-10 (×2): 9 mg via ORAL
  Filled 2021-08-08 (×3): qty 1

## 2021-08-08 MED ORDER — PALIPERIDONE ER 6 MG PO TB24
12.0000 mg | ORAL_TABLET | Freq: Every day | ORAL | Status: DC
  Administered 2021-08-11 – 2021-08-12 (×2): 12 mg via ORAL
  Filled 2021-08-08 (×4): qty 2

## 2021-08-08 NOTE — Progress Notes (Signed)
Pt appears to be responding to internal stimuli, but tries to appear that he is not experience those Sx    08/08/21 2000  Psych Admission Type (Psych Patients Only)  Admission Status Involuntary  Psychosocial Assessment  Patient Complaints Suspiciousness  Eye Contact Fair  Facial Expression Anxious;Animated  Affect Apprehensive;Preoccupied  Speech Logical/coherent  Interaction Cautious;Minimal  Motor Activity Slow  Appearance/Hygiene Disheveled  Behavior Characteristics Restless  Mood Suspicious;Preoccupied  Aggressive Behavior  Effect No apparent injury  Thought Process  Coherency Tangential  Content WDL  Delusions WDL  Perception Hallucinations  Hallucination Auditory  Judgment Impaired  Confusion Mild  Danger to Self  Current suicidal ideation? Denies  Danger to Others  Danger to Others None reported or observed

## 2021-08-08 NOTE — Progress Notes (Signed)
°   08/08/21 0500  °Sleep  °Number of Hours 7.25  ° ° °

## 2021-08-08 NOTE — Group Note (Signed)
[facility] LCSW Group Therapy Note  Date/Time: 08/08/2021  Type of Therapy and Topic:  Group Therapy:  Who Am I?  Self Esteem, Self-Actualization and Understanding Self.  Participation Level:  Active  Description of Group:    In this group patients will be asked to explore values, beliefs, truths, and morals as they relate to personal self.  Patients will be guided to discuss their thoughts, feelings, and behaviors related to what they identify as important to their true self. Patients will process together how values, beliefs and truths are connected to specific choices patients make every day. Each patient will be challenged to identify changes that they are motivated to make in order to improve self-esteem and self-actualization. This group will be process-oriented, with patients participating in exploration of their own experiences as well as giving and receiving support and challenge from other group members.  Therapeutic Goals: Patient will identify false beliefs that currently interfere with their self-esteem.  Patient will identify feelings, thought process, and behaviors related to self and will become aware of the uniqueness of themselves and of others.  Patient will be able to identify and verbalize values, morals, and beliefs as they relate to self. Patient will begin to learn how to build self-esteem/self-awareness by expressing what is important and unique to them personally.  Summary of Patient Progress: Patient actively participate in group.  Patient seemed to be thought blocking and answered some questions without being on topic.  Patient was easily redirectable and could be brought back to topic.  Patient identified independence as a value he had and how moving out of his mothers place helped him make decisions on his own.              Therapeutic Modalities:   Cognitive Behavioral Therapy Solution Focused Therapy Motivational Interviewing Brief Therapy   Alline Pio, LCSW, LCAS Clincal Social Worker  [facility]

## 2021-08-08 NOTE — Group Note (Signed)
Recreation Therapy Group Note   Group Topic:Team Building  Group Date: 08/08/2021 Start Time: 1000 End Time: 1030 Facilitators: Victorino Sparrow, LRT,CTRS Location: 500 Hall Dayroom   Goal Area(s) Addresses:  Patient will effectively work with peer towards shared goal.  Patient will identify skills used to make activity successful.  Patient will share challenges and verbalize solution-driven approaches used. Patient will identify how skills used during activity can be used to reach post d/c goals.    Group Description:  [employer]. Patients were provided the following materials: 5 drinking straws, 5 rubber bands, 5 paper clips, 2 index cards and 2 drinking cups. Using the provided materials patients were asked to build a launching mechanism to launch a ping pong ball across the room, approximately 10 feet. Patients were divided into teams of 3-5. Instructions required all materials be incorporated into the device, functionality of items left to the peer group's discretion.   Affect/Mood: Labile   Participation Level: Active   Participation Quality: Moderate Cues   Behavior: Apprehensive    Speech/Thought Process: Relevant   Insight: Fair   Judgement: Fair    Modes of Intervention: Group work and Music   Patient Response to Interventions:  Attentive and Resistant    Education Outcome:  Acknowledges education and In group clarification offered    Clinical Observations/Individualized Feedback: Pt was quiet but engaged with prompting throughout the activity.  Pt seemed to be more of the leader with partner however, pt tried to fly under the radar as well.  Pt comes across as resistant but it's hard to tell if he's not processing the information or truly is not wanting to participate.  When singled out pt will start smiling and give off body language of not wanting to do something but will end up doing it.  Pt was pleasant and cooperated when prompted.      Plan: Continue to  engage patient in RT group sessions 2-3x/week.   Victorino Sparrow, LRT,CTRS 08/08/2021 12:03 PM

## 2021-08-08 NOTE — Progress Notes (Signed)
Adult Psychoeducational Group Note  Date:  08/08/2021 Time:  8:25 PM  Group Topic/Focus:  Wrap-Up Group:   The focus of this group is to help patients review their daily goal of treatment and discuss progress on daily workbooks.  Participation Level:  Did Not Attend  Participation Quality:   Did Not Attend  Affect:   Did Not Attend  Cognitive:   Did Not Attend  Insight: None  Engagement in Group:   Did Not Attend  Modes of Intervention:   Did Not Attend  Additional Comments:  Pt was encouraged to attend wrap up group but did not attend.  Felipa Furnace 08/08/2021, 8:25 PM
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
## 2021-08-08 NOTE — BH IP Treatment Plan (Signed)
Interdisciplinary Treatment and Diagnostic Plan Update  08/08/2021 Jerry Garrison MRN: [id number]  Principal Diagnosis: Schizophrenia [facility])  Secondary Diagnoses: Principal Problem:   Schizophrenia (HCC)   Current Medications:  Current Facility-Administered Medications  Medication Dose Route Frequency Provider Last Rate Last Admin   acetaminophen (TYLENOL) tablet 650 mg  650 mg Oral Q6H PRN Starkes-Perry, Juel Burrow, FNP       alum & mag hydroxide-simeth (MAALOX/MYLANTA) 200-200-20 MG/5ML suspension 30 mL  30 mL Oral Q4H PRN Starkes-Perry, Juel Burrow, FNP       hydrOXYzine (ATARAX) tablet 25 mg  25 mg Oral TID PRN Phineas Inches, MD       OLANZapine zydis (ZYPREXA) disintegrating tablet 10 mg  10 mg Oral Q8H PRN Carlyn Reichert, MD       And   LORazepam (ATIVAN) tablet 1 mg  1 mg Oral PRN Carlyn Reichert, MD       And   ziprasidone (GEODON) injection 20 mg  20 mg Intramuscular PRN Carlyn Reichert, MD       magnesium hydroxide (MILK OF MAGNESIA) suspension 30 mL  30 mL Oral Daily PRN Starkes-Perry, Juel Burrow, FNP       OLANZapine (ZYPREXA) tablet 5 mg  5 mg Oral QHS Carlyn Reichert, MD   5 mg at 08/07/21 2046   paliperidone (INVEGA) 24 hr tablet 6 mg  6 mg Oral QHS Carlyn Reichert, MD   6 mg at 08/07/21 2047   traZODone (DESYREL) tablet 50 mg  50 mg Oral QHS PRN Massengill, Harrold Donath, MD       PTA Medications: Medications Prior to Admission  Medication Sig Dispense Refill Last Dose   benztropine (COGENTIN) 1 MG tablet Take 1 mg by mouth at bedtime.      OLANZapine (ZYPREXA) 15 MG tablet Take 15 mg by mouth at bedtime.       Patient Stressors: Marital or family conflict   Medication change or noncompliance    Patient Strengths: Automotive engineer for treatment/growth   Treatment Modalities: Medication Management, Group therapy, Case management,  1 to 1 session with clinician, Psychoeducation, Recreational therapy.   Physician Treatment Plan for Primary Diagnosis:  Schizophrenia (HCC) Long Term Goal(s): Improvement in symptoms so as ready for discharge   Short Term Goals: Ability to maintain clinical measurements within normal limits will improve Compliance with prescribed medications will improve  Medication Management: Evaluate patient's response, side effects, and tolerance of medication regimen.  Therapeutic Interventions: 1 to 1 sessions, Unit Group sessions and Medication administration.  Evaluation of Outcomes: Progressing  Physician Treatment Plan for Secondary Diagnosis: Principal Problem:   Schizophrenia (HCC)  Long Term Goal(s): Improvement in symptoms so as ready for discharge   Short Term Goals: Ability to maintain clinical measurements within normal limits will improve Compliance with prescribed medications will improve     Medication Management: Evaluate patient's response, side effects, and tolerance of medication regimen.  Therapeutic Interventions: 1 to 1 sessions, Unit Group sessions and Medication administration.  Evaluation of Outcomes: Progressing   RN Treatment Plan for Primary Diagnosis: Schizophrenia (HCC) Long Term Goal(s): Knowledge of disease and therapeutic regimen to maintain health will improve  Short Term Goals: Ability to participate in decision making will improve, Ability to verbalize feelings will improve, and Ability to identify and develop effective coping behaviors will improve  Medication Management: RN will administer medications as ordered by provider, will assess and evaluate patient's response and provide education to patient for prescribed medication. RN will report any  adverse and/or side effects to prescribing provider.  Therapeutic Interventions: 1 on 1 counseling sessions, Psychoeducation, Medication administration, Evaluate responses to treatment, Monitor vital signs and CBGs as ordered, Perform/monitor CIWA, COWS, AIMS and Fall Risk screenings as ordered, Perform wound care treatments as  ordered.  Evaluation of Outcomes: Progressing   LCSW Treatment Plan for Primary Diagnosis: Schizophrenia (HCC) Long Term Goal(s): Safe transition to appropriate next level of care at discharge, Engage patient in therapeutic group addressing interpersonal concerns.  Short Term Goals: Engage patient in aftercare planning with referrals and resources, Increase social support, and Increase ability to appropriately verbalize feelings  Therapeutic Interventions: Assess for all discharge needs, 1 to 1 time with Social worker, Explore available resources and support systems, Assess for adequacy in community support network, Educate family and significant other(s) on suicide prevention, Complete Psychosocial Assessment, Interpersonal group therapy.  Evaluation of Outcomes: Progressing   Progress in Treatment: Attending groups: Yes. Participating in groups: Yes. Taking medication as prescribed: Yes. Toleration medication: Yes. Family/Significant other contact made: Yes, individual(s) contacted:  Pt declined consents Patient understands diagnosis: Yes. and No. Discussing patient identified problems/goals with staff: Yes. Medical problems stabilized or resolved: Yes. Denies suicidal/homicidal ideation: Yes. Issues/concerns per patient self-inventory: No. Other: None  New problem(s) identified: No, Describe:  None  New Short Term/Long Term Goal(s):medication stabilization, elimination of SI thoughts, development of comprehensive mental wellness plan.   Patient Goals:  "go home"  Discharge Plan or Barriers: Patient recently admitted. CSW will continue to follow and assess for appropriate referrals and possible discharge planning.   Reason for Continuation of Hospitalization: Delusions  Medication stabilization  Estimated Length of Stay: 3-5 days   Scribe for Treatment Team: Chrys Racer 08/08/2021 12:17 PM

## 2021-08-08 NOTE — Progress Notes (Signed)
Pt visible in dayroom for scheduled groups. Will not complete nursing assessment with writer earlier this shift on initial interactions. Denies SI, HI, AVH and pain with head node. Observed to be animated but minimal when engaged. Refused to come back to unit, had a stand off with staff post lunch despite multiple prompts "I'm not going back in there, do what you want. I will stay out here". Pt placed on unit restriction post incident. Q 15 minutes safety checks maintained. Emotional support provided to pt this shift. Pt encouraged to voice concerns. Continues to pace hall and standing around exit door. Continued redirections offered.

## 2021-08-09 DIAGNOSIS — F209 Schizophrenia, unspecified: Secondary | ICD-10-CM

## 2021-08-09 NOTE — Progress Notes (Signed)
°   08/09/21 0530  Sleep  Number of Hours 7.25

## 2021-08-09 NOTE — Plan of Care (Addendum)
Pt ambulatory on the unit engaging with other pts in hallway and dayroom. Pt denies SI/HI and AVH. Pt has been calm and cooperative on the unit. Education, support and encouragement provided, q15 minute safety checks remain in effect. Pt remains safe on the unit.   Problem: Coping: Goal: Ability to demonstrate self-control will improve Outcome: Progressing   Problem: Safety: Goal: Periods of time without injury will increase Outcome: Progressing   Problem: Health Behavior/Discharge Planning: Goal: Compliance with prescribed medication regimen will improve Outcome: Progressing   Problem: Nutritional: Goal: Ability to achieve adequate nutritional intake will improve Outcome: Progressing   Problem: Safety: Goal: Ability to remain free from injury will improve Outcome: Progressing

## 2021-08-09 NOTE — Group Note (Signed)
LCSW Group Therapy Note ° °No therapy group could be held today due to other needs on the unit that were prioritized.  The patient did not go without group, as another licensed group was held by an RN. ° °Zain Bingman Grossman-Orr, LCSW °04/26/2021 °10:10 AM  °   °
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
## 2021-08-09 NOTE — Progress Notes (Signed)
Adult Psychoeducational Group Note  Date:  08/09/2021 Time:  8:29 PM  Group Topic/Focus:  Wrap-Up Group:   The focus of this group is to help patients review their daily goal of treatment and discuss progress on daily workbooks.  Participation Level:  Did Not Attend  Participation Quality:   Did Not Attend  Affect:   Did Not Attend  Cognitive:   Did Not Attend   Insight: None  Engagement in Group:   Did Not Attend  Modes of Intervention:   Did not Attend   Additional Comments:  Pt was encouraged to attend wrap up group but did not attend.  Felipa Furnace 08/09/2021, 8:29 PM

## 2021-08-10 NOTE — Progress Notes (Signed)
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
## 2021-08-10 NOTE — Progress Notes (Signed)
Pt was encouraged but didn't attend orientation/goals group. ?

## 2021-08-10 NOTE — Progress Notes (Signed)
°   08/10/21 2044  Psych Admission Type (Psych Patients Only)  Admission Status Involuntary  Psychosocial Assessment  Patient Complaints None  Eye Contact Fair  Facial Expression Animated;Anxious  Affect Anxious;Appropriate to circumstance  Speech Logical/coherent  Interaction No initiation  Motor Activity Slow  Appearance/Hygiene Improved  Behavior Characteristics Restless  Mood Preoccupied  Thought Process  Coherency Tangential  Content WDL  Delusions WDL  Perception Hallucinations  Hallucination Auditory  Judgment Impaired  Confusion None  Danger to Self  Current suicidal ideation? Denies  Danger to Others  Danger to Others None reported or observed

## 2021-08-10 NOTE — Progress Notes (Signed)
Adult Psychoeducational Group Note  Date:  08/10/2021 Time:  8:43 PM  Group Topic/Focus:  Wrap-Up Group:   The focus of this group is to help patients review their daily goal of treatment and discuss progress on daily workbooks.  Participation Level:  Active  Participation Quality:  Appropriate  Affect:  Appropriate  Cognitive:  Appropriate  Insight: Appropriate  Engagement in Group:  Engaged  Modes of Intervention:  Discussion  Additional Comments:  Pt stated his goal for today was to focus on his treatment plan. Pt stated he accomplished his goal today. Pt stated he talked with his doctor and social worker about his care today. Pt rated his overall day a 10. Pt stated he was able to contact his mother and her coming for visitation tonight improved his overall day.  Pt stated he felt better about himself today. Pt stated he was bought back all meals today because he is on unit restriction. Pt stated he took all medications provided today.  Pt stated his appetite was pretty good today. Pt rated sleep last night was pretty good. Pt stated the goal tonight was to get some rest. Pt stated he had no physical pain tonight. Pt deny visual hallucinations and auditory issues tonight. Pt denies thoughts of harming himself or others. Pt stated he would alert staff if anything changed  Jerry Garrison 08/10/2021, 8:43 PM

## 2021-08-10 NOTE — Group Note (Signed)
°  [facility] LCSW Group Therapy Note  Date/Time:  08/10/2021 10:00AM-11:00AM  Type of Therapy and Topic:  Group Therapy:  Ways to Love Myself and Take Care of Myself  Participation Level:  Active   Description of Group This process group started with group leader playing a song entitled "Love Me More" to facilitate a discussion about the need to love and respect ourselves and prioritize taking care of ourselves, especially after hospital discharge.   There was a discussion about the need for self-love, and patients listed ways in which they are demonstrating self-love while in the hospital.  Patients were then asked to share how they plan to take care of themselves in a better manner when they get home from the hospital.  Group members shared ideas about making changes when they return home so that they can stay well and in recovery.    Therapeutic Goals Patient will listen and be able to relate to a song about prioritizing themselves through self-love Patient will participate in generating ideas about healthy self-care options when they return to the community Patients will be supportive of one another and receive said support from others   Summary of Patient Progress:  The patient expressed that one way he is showing self-love while in the hospital is to open up more.  Something he could do after discharge to continue loving himself is to get therapy.  Patient's participation in group was good until we reached a point where he wanted to talk about differences in religion.   Patient then became demanding and said it was ridiculous to make certain topics off limits.  CSW listed some other things that just are not group topics such as sexuality and political views.  He could not comprehend why these subjects would not be covered.  Whereas he had been pleasant throughout group until this point, he was visibly upset and left the room.   Therapeutic Modalities Activity Motivational  Interviewing Processing   Ambrose Mantle, LCSW 08/10/2021, 12:00pm

## 2021-08-11 NOTE — Progress Notes (Signed)
Pt is guarded on approach but opens up with RN throughout the day.  Pt denies SI/HI/AVH.  Pt's thought processes are logical for the most part.  Pt seems to be in no acute distress.

## 2021-08-11 NOTE — Group Note (Signed)
Recreation Therapy Group Note   Group Topic:Communication  Group Date: 08/11/2021 Start Time: 1000 End Time: 1038 Facilitators: Caroll Rancher, LRT,CTRS Location: [address] Dayroom   Goal Area(s) Addresses:  Patient will effectively listen to complete activity.  Patient will identify communication skills used to make activity successful.  Patient will identify how skills used during activity can be used to reach post d/c goals.      Group Description:  Geometric Drawings.  Three volunteers from the peer group will be shown an abstract picture with a particular arrangement of geometrical shapes.  Each round, one 'speaker' will describe the pattern, as accurately as possible without revealing the image to the group.  The remaining group members will listen and draw the picture to reflect how it is described to them. Patients with the role of 'listener' cannot ask clarifying questions but, may request that the speaker repeat a direction. Once the drawings are complete, the presenter will show the rest of the group the picture and compare how close each person came to drawing the picture. LRT will facilitate a post-activity discussion regarding effective communication and the importance of planning, listening, and asking for clarification in daily interactions with others.   Affect/Mood: N/A   Participation Level: Did not attend    Clinical Observations/Individualized Feedback:      Plan: Continue to engage patient in RT group sessions 2-3x/week.   Caroll Rancher, LRT,CTRS 08/11/2021 12:06 PM
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
## 2021-08-11 NOTE — Progress Notes (Signed)
Adult Psychoeducational Group Note  Date:  08/11/2021 Time:  10:36 PM  Group Topic/Focus:  Wrap-Up Group:   The focus of this group is to help patients review their daily goal of treatment and discuss progress on daily workbooks.  Participation Level:  Active  Participation Quality:  Appropriate  Affect:  Anxious  Cognitive:  Disorganized  Insight: Appropriate  Engagement in Group:  Distracting  Modes of Intervention:  Discussion  Additional Comments:  Pt stated his goal for today was to focus on his treatment plan. Pt stated he accomplished his goal today. Pt stated he talked with his doctor and social worker about his care today. Pt rated his overall day a 10. Pt stated he made no calls today. Pt stated his mother  coming for visitation tonight improved his overall day.  Pt stated he felt better about himself today. Pt stated he was able to attend all meals held today. Pt stated he took all medications provided today.  Pt stated his appetite was pretty good today. Pt rated sleep last night was pretty good. Pt stated the goal tonight was to get some rest. Pt stated he had no physical pain tonight. Pt deny visual hallucinations and auditory issues tonight. Pt denies thoughts of harming himself or others. Pt stated he would alert staff if anything changed  Felipa Furnace 08/11/2021, 10:36 PM

## 2021-08-11 NOTE — Group Note (Signed)
Type of Therapy and Topic:  Group Therapy:  Stress Management   Participation Level:  Active    Description of Group:  Patients in this group were introduced to the idea of stress and encouraged to discuss negative and positive ways to manage stress. Patients discussed specific stressors that they have in their life right now and the physical signs and symptoms associated with that stress.  Patient encouraged to come up with positive changes to assist with the stress upon discharge in order to prevent future hospitalizations.   They also worked as a group on developing a specific plan for several patients to deal with stressors through boundary-setting, psychoeducation and self care techniques   Therapeutic Goals:               1)  To discuss the positive and negative impacts of stress             2)  identify signs and symptoms of stress             3)  generate ideas for stress management             4)  offer mutual support to others regarding stress management             5)  Developing plans for ways to manage specific stressors upon discharge               Summary of Patient Progress:  Patient participated appropriately in group.  Patient actively participated in stress exploration and identified daily stress of making his bed.  He also discussed having a childhood being a major life event that has been stressful.  Patient reports that going on a walk and exploring will be ways he continues to care for himself when he is stressed.    Therapeutic Modalities:   Motivational Interviewing Brief Solution-Focused Therapy   Jerek Meulemans, LCSW, LCAS Clincal Social Worker  [facility]

## 2021-08-11 NOTE — Progress Notes (Signed)
Pt refused to talk to writer, pt spent much of the evening following behind the male patient on the unit, pt was seen talking to her much of the evening.     08/11/21 2100  Psych Admission Type (Psych Patients Only)  Admission Status Involuntary  Psychosocial Assessment  Eye Contact Fair  Facial Expression Animated;Anxious  Affect Anxious;Appropriate to circumstance  Speech Logical/coherent  Interaction No initiation  Motor Activity Slow  Appearance/Hygiene Improved  Behavior Characteristics Restless  Mood Suspicious  Thought Process  Coherency WDL  Content WDL  Delusions WDL  Perception WDL  Hallucination None reported or observed  Judgment Impaired  Confusion None  Danger to Self  Current suicidal ideation? Denies  Danger to Others  Danger to Others None reported or observed

## 2021-08-11 NOTE — B[facility] Group Notes (Signed)
Spirituality group facilitated by Kathleen Argue, BCC.   Group Description: Group focused on topic of hope. Patients participated in facilitated discussion around topic, connecting with one another around experiences and definitions for hope. Group members engaged with visual explorer photos, reflecting on what hope looks like for them today. Group engaged in discussion around how their definitions of hope are present today in hospital.   Modalities: Psycho-social ed, Adlerian, Narrative, MI   Patient Progress: Patient attended and became tearful as he began to talk about hope.  He was came in and out of group several times.  Chaplain Dyanne Carrel, Bcc Pager, [phone] 10:16 PM

## 2021-08-12 NOTE — Plan of Care (Signed)
  Problem: Activity: Goal: Interest or engagement in activities will improve Outcome: Progressing   Problem: Coping: Goal: Ability to verbalize frustrations and anger appropriately will improve Outcome: Progressing   Problem: Coping: Goal: Ability to demonstrate self-control will improve Outcome: Progressing   Problem: Safety: Goal: Periods of time without injury will increase Outcome: Progressing   

## 2021-08-12 NOTE — BHH Counselor (Signed)
CSW spoke with patient regarding discharge and disposition.  Patient continues to deny consents of anyone that this CSW can speak to.  Patient reports that he plans to go to the [facility] at discharge and stay on friends couches.  Patient reports that he needs resources for food and that he would like information on how to apply for food stamps.    CSW provided food resources and education on how to apply for food stamps.  CSW agreed that she can meet with him tomorrow to go over the process of applying for food stamps.  CSW also provided patient with shelter resources.    Alyiah Ulloa, LCSW, LCAS Clincal Social Worker  [facility]

## 2021-08-12 NOTE — Group Note (Signed)
Recreation Therapy Group Note   Group Topic:Healthy Support Systems  Group Date: 08/12/2021 Start Time: 1000 End Time: 1020 Facilitators: Victorino Sparrow, LRT,CTRS Location: [address] Dayroom   Goal Area(s) Addresses:  Patient will identify members of their support system. Patient will acknowledge benefit of healthy supports in daily life.   Group Description: Social Support.  LRT led a discussion on the different ways support systems offer social support (emotional, tangible, informational and social).  Patients would then identify their three biggest supporters, how they help them and what barriers the patient from using the help their supports offer   Affect/Mood: N/A   Participation Level: Did not attend    Clinical Observations/Individualized Feedback:     Plan: Continue to engage patient in RT group sessions 2-3x/week.   Victorino Sparrow, Glennis Brink 08/12/2021 12:49 PM

## 2021-08-12 NOTE — Progress Notes (Signed)
EKG completed and placed on the front of the chart  

## 2021-08-12 NOTE — Progress Notes (Signed)
°   08/12/21 2300  Psych Admission Type (Psych Patients Only)  Admission Status Involuntary  Psychosocial Assessment  Patient Complaints None  Eye Contact Fair  Facial Expression Animated;Anxious  Affect Anxious;Appropriate to circumstance  Speech Logical/coherent  Interaction No initiation  Motor Activity Slow  Appearance/Hygiene Improved  Behavior Characteristics Cooperative  Mood Suspicious  Thought Process  Coherency WDL  Content WDL  Delusions WDL  Perception WDL  Hallucination None reported or observed  Judgment Impaired  Confusion None  Danger to Self  Current suicidal ideation? Denies  Danger to Others  Danger to Others None reported or observed
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
## 2021-08-12 NOTE — Progress Notes (Signed)
Adult Psychoeducational Group Note  Date:  08/12/2021 Time:  11:34 PM  Group Topic/Focus:  Wrap-Up Group:   The focus of this group is to help patients review their daily goal of treatment and discuss progress on daily workbooks.  Participation Level:  Active  Participation Quality:  Appropriate  Affect:  Appropriate  Cognitive:  Appropriate  Insight: Appropriate  Engagement in Group:  Engaged  Modes of Intervention:  Discussion  Additional Comments:  Pt stated his goal for today was to focus on his treatment plan. Pt stated he accomplished his goal today. Pt stated he talked with his doctor and social worker about his care today. Pt rated his overall day a 10. Pt stated he made no calls today.   Pt stated he felt better about himself today. Pt stated he was able to attend all meals held today. Pt stated he took all medications provided today.  Pt stated his appetite was pretty good today. Pt rated sleep last night was fair. Pt stated the goal tonight was to get some rest. Pt stated he had no physical pain tonight. Pt deny visual hallucinations and auditory issues tonight. Pt denies thoughts of harming himself or others. Pt stated he would alert staff if anything changed  Felipa Furnace 08/12/2021, 11:34 PM

## 2021-08-12 NOTE — Progress Notes (Signed)
°   08/12/21 1200  Psych Admission Type (Psych Patients Only)  Admission Status Involuntary  Psychosocial Assessment  Patient Complaints None  Eye Contact Fair  Facial Expression Animated;Anxious  Affect Anxious;Appropriate to circumstance  Speech Logical/coherent  Interaction No initiation  Motor Activity Slow  Appearance/Hygiene Improved  Behavior Characteristics Cooperative  Mood Suspicious  Thought Process  Coherency WDL  Content WDL  Delusions WDL  Perception WDL  Hallucination None reported or observed  Judgment Impaired  Confusion None  Danger to Self  Current suicidal ideation? Denies  Danger to Others  Danger to Others None reported or observed

## 2021-08-13 ENCOUNTER — Encounter (HOSPITAL_COMMUNITY): Payer: Self-pay

## 2021-08-13 MED ORDER — PALIPERIDONE ER 6 MG PO TB24
6.0000 mg | ORAL_TABLET | Freq: Every day | ORAL | Status: DC
  Administered 2021-08-13 – 2021-08-14 (×2): 6 mg via ORAL
  Filled 2021-08-13 (×3): qty 1

## 2021-08-13 MED ORDER — PALIPERIDONE PALMITATE ER 234 MG/1.5ML IM SUSY
234.0000 mg | PREFILLED_SYRINGE | Freq: Once | INTRAMUSCULAR | Status: AC
  Administered 2021-08-13: 234 mg via INTRAMUSCULAR

## 2021-08-13 NOTE — BH IP Treatment Plan (Signed)
Interdisciplinary Treatment and Diagnostic Plan Update  08/13/2021 Time of Session: 10:05am  Jerry Garrison MRN: [id number]  Principal Diagnosis: Schizophrenia [facility])  Secondary Diagnoses: Principal Problem:   Schizophrenia (HCC)   Current Medications:  Current Facility-Administered Medications  Medication Dose Route Frequency Provider Last Rate Last Admin   acetaminophen (TYLENOL) tablet 650 mg  650 mg Oral Q6H PRN Starkes-Perry, Juel Burrow, FNP       alum & mag hydroxide-simeth (MAALOX/MYLANTA) 200-200-20 MG/5ML suspension 30 mL  30 mL Oral Q4H PRN Starkes-Perry, Juel Burrow, FNP       hydrOXYzine (ATARAX) tablet 25 mg  25 mg Oral TID PRN Phineas Inches, MD       OLANZapine zydis (ZYPREXA) disintegrating tablet 10 mg  10 mg Oral Q8H PRN Carlyn Reichert, MD       And   LORazepam (ATIVAN) tablet 1 mg  1 mg Oral PRN Carlyn Reichert, MD       And   ziprasidone (GEODON) injection 20 mg  20 mg Intramuscular PRN Carlyn Reichert, MD       magnesium hydroxide (MILK OF MAGNESIA) suspension 30 mL  30 mL Oral Daily PRN Maryagnes Amos, FNP       OLANZapine (ZYPREXA) tablet 10 mg  10 mg Oral QHS Carlyn Reichert, MD   10 mg at 08/12/21 2051   paliperidone (INVEGA) 24 hr tablet 12 mg  12 mg Oral QHS Massengill, Harrold Donath, MD   12 mg at 08/12/21 2051   traZODone (DESYREL) tablet 50 mg  50 mg Oral QHS PRN Massengill, Harrold Donath, MD       PTA Medications: Medications Prior to Admission  Medication Sig Dispense Refill Last Dose   benztropine (COGENTIN) 1 MG tablet Take 1 mg by mouth at bedtime.      OLANZapine (ZYPREXA) 15 MG tablet Take 15 mg by mouth at bedtime.       Patient Stressors: Marital or family conflict   Medication change or noncompliance    Patient Strengths: Automotive engineer for treatment/growth   Treatment Modalities: Medication Management, Group therapy, Case management,  1 to 1 session with clinician, Psychoeducation, Recreational therapy.   Physician  Treatment Plan for Primary Diagnosis: Schizophrenia (HCC) Long Term Goal(s): Improvement in symptoms so as ready for discharge   Short Term Goals: Ability to maintain clinical measurements within normal limits will improve Compliance with prescribed medications will improve  Medication Management: Evaluate patient's response, side effects, and tolerance of medication regimen.  Therapeutic Interventions: 1 to 1 sessions, Unit Group sessions and Medication administration.  Evaluation of Outcomes: Progressing  Physician Treatment Plan for Secondary Diagnosis: Principal Problem:   Schizophrenia (HCC)  Long Term Goal(s): Improvement in symptoms so as ready for discharge   Short Term Goals: Ability to maintain clinical measurements within normal limits will improve Compliance with prescribed medications will improve     Medication Management: Evaluate patient's response, side effects, and tolerance of medication regimen.  Therapeutic Interventions: 1 to 1 sessions, Unit Group sessions and Medication administration.  Evaluation of Outcomes: Progressing   RN Treatment Plan for Primary Diagnosis: Schizophrenia (HCC) Long Term Goal(s): Knowledge of disease and therapeutic regimen to maintain health will improve  Short Term Goals: Ability to remain free from injury will improve, Ability to participate in decision making will improve, Ability to verbalize feelings will improve, Ability to disclose and discuss suicidal ideas, and Ability to identify and develop effective coping behaviors will improve  Medication Management: RN will administer medications as ordered  by provider, will assess and evaluate patient's response and provide education to patient for prescribed medication. RN will report any adverse and/or side effects to prescribing provider.  Therapeutic Interventions: 1 on 1 counseling sessions, Psychoeducation, Medication administration, Evaluate responses to treatment, Monitor vital  signs and CBGs as ordered, Perform/monitor CIWA, COWS, AIMS and Fall Risk screenings as ordered, Perform wound care treatments as ordered.  Evaluation of Outcomes: Progressing   LCSW Treatment Plan for Primary Diagnosis: Schizophrenia (HCC) Long Term Goal(s): Safe transition to appropriate next level of care at discharge, Engage patient in therapeutic group addressing interpersonal concerns.  Short Term Goals: Engage patient in aftercare planning with referrals and resources, Increase social support, Increase emotional regulation, Facilitate acceptance of mental health diagnosis and concerns, Identify triggers associated with mental health/substance abuse issues, and Increase skills for wellness and recovery  Therapeutic Interventions: Assess for all discharge needs, 1 to 1 time with Social worker, Explore available resources and support systems, Assess for adequacy in community support network, Educate family and significant other(s) on suicide prevention, Complete Psychosocial Assessment, Interpersonal group therapy.  Evaluation of Outcomes: Progressing   Progress in Treatment: Attending groups: Yes. Participating in groups: Yes. Taking medication as prescribed: Yes. Toleration medication: Yes. Family/Significant other contact made: Yes, individual(s) contacted:  Pt declined consents Patient understands diagnosis: Yes. and No. Discussing patient identified problems/goals with staff: Yes. Medical problems stabilized or resolved: Yes. Denies suicidal/homicidal ideation: Yes. Issues/concerns per patient self-inventory: No. Other: None   New problem(s) identified: No, Describe:  None   New Short Term/Long Term Goal(s):medication stabilization, elimination of SI thoughts, development of comprehensive mental wellness plan.    Patient Goals:  "go home"   Discharge Plan or Barriers: Patient recently admitted. CSW will continue to follow and assess for appropriate referrals and possible  discharge planning.    Reason for Continuation of Hospitalization: Delusions  Medication stabilization   Estimated Length of Stay: 3-5 days   Scribe for Treatment Team: Aram Beecham, Theresia Majors 08/13/2021 1:35 PM
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
## 2021-08-13 NOTE — BHH Group Notes (Signed)
Adult Psychoeducational Group Note  Date:  08/13/2021 Time:  9:01 PM  Group Topic/Focus:  Wrap-Up Group:   The focus of this group is to help patients review their daily goal of treatment and discuss progress on daily workbooks.  Participation Level:  Active  Participation Quality:  Appropriate and Attentive  Affect:  Appropriate  Cognitive:  Alert and Appropriate  Insight: Appropriate and Good  Engagement in Group:  Engaged  Modes of Intervention:  Discussion and Education  Additional Comments:  Pt attended and participated in wrap up group this evening and rated their day a 10/10. Pt stated that they played basketball and went to two groups today. Pt does not have an estimated discharge date, but states that they know when they will be leaving. Tomorrow pt would like to work on not eating too much to them feeling ill from overeating today.   Chrisandra Netters 08/13/2021, 9:01 PM

## 2021-08-13 NOTE — BHH Group Notes (Signed)
Pt did not attend psychoeducational group. 

## 2021-08-13 NOTE — Group Note (Signed)
LCSW Group Therapy Note   Group Date: 08/13/2021 Start Time: 1300 End Time: 1400    Type of Therapy and Topic:  Group Therapy:  Strengths Exploration   Participation Level: Active  Description of Group: This group allows individuals to explore their strengths, learn to use strengths in new ways to improve well-being. Strengths-based interventions involve identifying strengths, understanding how they are used, and learning new ways to apply them. Individuals will identify their strengths, and then explore their roles in different areas of life (relationships, professional life, and personal fulfillment). Individuals will think about ways in which they currently use their strengths, along with new ways they could begin using them.    Therapeutic Goals Patient will verbalize two of their strengths Patient will identify how their strengths are currently used Patient will identify two new ways to apply their strengths  Patients will create a plan to apply their strengths in their daily lives     Summary of Patient Progress:  Pt attended and participated in group. Pt shared that he believes he has self-control and is able to maintain peace. Pt states he admires his sister for being authentic.      Therapeutic Modalities Cognitive Behavioral Therapy Motivational Interviewing   Ruthann Cancer MSW, LCSW Clincal Social Worker  [facility]

## 2021-08-13 NOTE — Progress Notes (Signed)
Pt stated he was doing better, pt dis not appear to be responding to internal stimuli as much    08/13/21 2100  Psych Admission Type (Psych Patients Only)  Admission Status Involuntary  Psychosocial Assessment  Patient Complaints None  Eye Contact Fair  Facial Expression Animated;Anxious  Affect Appropriate to circumstance  Speech Logical/coherent  Interaction Cautious;Minimal  Motor Activity Slow  Appearance/Hygiene Improved  Behavior Characteristics Cooperative  Mood Preoccupied  Aggressive Behavior  Effect No apparent injury  Thought Process  Coherency Concrete thinking  Content WDL  Delusions None reported or observed  Perception WDL  Hallucination None reported or observed  Judgment Impaired  Confusion None  Danger to Self  Current suicidal ideation? Denies  Danger to Others  Danger to Others None reported or observed

## 2021-08-13 NOTE — Group Note (Signed)
Recreation Therapy Group Note   Group Topic:Problem Solving  Group Date: 08/13/2021 Start Time: 1006 End Time: 1040 Facilitators: Victorino Sparrow, LRT,CTRS Location: [address] Day[address]   Goal Area(s) Addresses:  Patient will effectively work with peer towards shared goal.  Patient will identify factors that guided their decision making.  Patient will pro-socially communicate ideas during group session.   Group Description:  Patients were given a scenario that they were going to be stranded on a deserted [place] for several months before being rescued. Writer tasked them with making a list of 15 things they would choose to bring with them for "survival". The list of items was prioritized most important to least. Each patient would come up with their own list, then work together to create a new list of 15 items while in a group of 3-5 peers. LRT discussed each person's list and how it differed from others. The debrief included discussion of priorities, good decisions versus bad decisions, and how it is important to think before acting so we can make the best decision possible. LRT tied the concept of effective communication among group members to patient's support systems outside of the hospital and its benefit post discharge.   Affect/Mood: Appropriate   Participation Level: Engaged   Participation Quality: Independent   Behavior: Appropriate   Speech/Thought Process: Focused   Insight: Good   Judgement: Good   Modes of Intervention: Problem-solving   Patient Response to Interventions:  Engaged   Education Outcome:  Acknowledges education and In group clarification offered    Clinical Observations/Individualized Feedback: Pt was late to group.  Pt joined right in with the activity when given the instructions.  Pt was bright and engaged in discussion without prompting.  Pt identified blankets, companion, food, water and tent/shelter as things he would take with him.  Pt expressed  one decision he would make when discharged is being more social.  Pt explained being more social would help him measure his sanity and allow him to bounce ideas off other people.  Pt was appropriate throughout group.     Plan: Continue to engage patient in RT group sessions 2-3x/week.   Victorino Sparrow, LRT,CTRS  08/13/2021 11:18 AM

## 2021-08-13 NOTE — Progress Notes (Signed)
°   08/13/21 0545  Sleep  Number of Hours 6.5

## 2021-08-14 NOTE — Group Note (Signed)
Date:  08/14/2021 Time:  4:11 PM  Group Topic/Focus:  Emotional Education:   The focus of this group is to discuss what feelings/emotions are, and how they are experienced.    Participation Level:  Did Not Attend    Gwenevere Abbot Ramsay 08/14/2021, 4:11 PM

## 2021-08-14 NOTE — Group Note (Signed)
Recreation Therapy Group Note   Group Topic:Health and Wellness  Group Date: 08/14/2021 Start Time: 1000 End Time: 1025 Facilitators: Caroll Rancher, LRT,CTRS Location: [address] Dayroom   Goal Area(s) Addresses:  Patient will define components of whole wellness. Patient will verbalize benefit of whole wellness.  Group Description:  Exercise.  LRT discussed the importance of exercise with patients.  LRT led patients in a series of stretches to loosen the muscles.  Patients then took turns leading the group in exercises of their choosing.  LRT explained the exercise will consist of at least 30 minutes and encouraged to get water and take breaks if needed.   Affect/Mood: N/A   Participation Level: Did not attend    Clinical Observations/Individualized Feedback:      Plan: Continue to engage patient in RT group sessions 2-3x/week.   Caroll Rancher, LRT,CTRS 08/14/2021 11:31 AM

## 2021-08-14 NOTE — BHH Counselor (Signed)
CSW assisted patient with applying for food stamps.  Patient appreciative and advised to follow up with application at DSS.  CSW provided contact number and information for DSS.    Jerry Kaus, LCSW, LCAS Clincal Social Worker  [facility]

## 2021-08-14 NOTE — Progress Notes (Signed)
°   08/14/21 2000  Psych Admission Type (Psych Patients Only)  Admission Status Involuntary  Psychosocial Assessment  Patient Complaints None  Eye Contact Fair  Facial Expression Animated;Anxious  Affect Appropriate to circumstance  Speech Logical/coherent  Interaction Cautious;Minimal  Motor Activity Slow  Appearance/Hygiene Improved  Behavior Characteristics Cooperative  Mood Preoccupied  Aggressive Behavior  Effect No apparent injury  Thought Process  Coherency Concrete thinking  Content WDL  Delusions None reported or observed  Perception WDL  Hallucination None reported or observed  Judgment Impaired  Confusion None  Danger to Self  Current suicidal ideation? Denies  Danger to Others  Danger to Others None reported or observed
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
## 2021-08-14 NOTE — Progress Notes (Signed)
°   08/14/21 1300  Psych Admission Type (Psych Patients Only)  Admission Status Involuntary  Psychosocial Assessment  Patient Complaints None  Eye Contact Fair  Facial Expression Animated;Anxious  Affect Appropriate to circumstance  Speech Logical/coherent  Interaction Cautious;Minimal  Motor Activity Slow  Appearance/Hygiene Improved  Behavior Characteristics Cooperative  Mood Preoccupied  Aggressive Behavior  Effect No apparent injury  Thought Process  Coherency Concrete thinking  Content WDL  Delusions None reported or observed  Perception WDL  Hallucination None reported or observed  Judgment Impaired  Confusion None  Danger to Self  Current suicidal ideation? Denies  Danger to Others  Danger to Others None reported or observed

## 2021-08-15 ENCOUNTER — Ambulatory Visit
Admission: EM | Admit: 2021-08-15 | Discharge: 2021-08-15 | Discharge status: Against Medical Advice | Payer: BLUE CROSS/BLUE SHIELD

## 2021-08-15 ENCOUNTER — Other Ambulatory Visit: Payer: Self-pay

## 2021-08-15 DIAGNOSIS — R462 Strange and inexplicable behavior: Secondary | ICD-10-CM

## 2021-08-15 MED ORDER — PALIPERIDONE ER 6 MG PO TB24: 6.0000 mg | ORAL_TABLET | Freq: Every day | ORAL | 0 refills | Status: DC

## 2021-08-15 MED ORDER — OLANZAPINE 10 MG PO TABS: 10.0000 mg | ORAL_TABLET | Freq: Every day | ORAL | 0 refills | Status: DC

## 2021-08-15 NOTE — Progress Notes (Signed)
°  [facility] Adult Case Management Discharge Plan :  Will you be returning to the same living situation after discharge:  Yes,  shelter At discharge, do you have transportation home?: No. Need [place] Do you have the ability to pay for your medications: Yes,  resources for affordable medications and services.   Release of information consent forms completed and in the chart;  Patient's signature needed at discharge.  Patient to Follow up at:  Follow-up Information     Jeannette How, MD Follow up.   Specialty: Behavioral Health Why: A referral has been made to this provider for medication management services.  Please call to schedule an appointment. Contact information: [address] [place] [address] [phone]         [facility]. Schedule an appointment as soon as possible for a visit.   Specialty: Behavioral Health Why: A referral has been made to this provider for therapy and/or medication management services.  Please call to schedule an appointment and possibly to pay any necessary initial be to become established. Contact information: [address] [place] [address] [phone]         [place] [facility]uit[facility]. Call.   Why: They can assist with quitting smoking or continuing to quit smoking.  Please reach out and they can help get connected with smoking cessation tools and counseling. Contact information: 1-800-QUIT-NOW [phone])                Next level of care provider has access to [facility] Link:no  Safety Planning and Suicide Prevention discussed: Yes,  with patient.  Patient denied consents to support system     Has patient been referred to the Quitline?: Yes, patient provided information, could not remember phone number to provide in referral so instrtucted to call the quitline and provided brochure.   Patient has been referred for addiction treatment: N/A  Naeem Quillin E Trustin Chapa,  LCSW 08/15/2021, 10:22 AM

## 2021-08-15 NOTE — Group Note (Signed)
LCSW Group Therapy Note  Group Date: 08/15/2021 Start Time: 1100 End Time: 1200   Type of Therapy and Topic:  Group Therapy - Healthy vs Unhealthy Coping Skills  Participation Level:  Active   Description of Group The focus of this group was to determine what unhealthy coping techniques typically are used by group members and what healthy coping techniques would be helpful in coping with various problems. Patients were guided in becoming aware of the differences between healthy and unhealthy coping techniques. Patients were asked to identify 2-3 healthy coping skills they would like to learn to use more effectively.  Therapeutic Goals Patients learned that coping is what human beings do all day long to deal with various situations in their lives Patients defined and discussed healthy vs unhealthy coping techniques Patients identified their preferred coping techniques and identified whether these were healthy or unhealthy Patients determined 2-3 healthy coping skills they would like to become more familiar with and use more often. Patients provided support and ideas to each other   Summary of Patient Progress:  During group, Jerry Garrison was able to identify negative coping skills he used and struggled to identify health coping skills he uses.    Therapeutic Modalities Cognitive Behavioral Therapy Motivational Interviewing  Otelia Santee, LCSW 08/15/2021  11:41 AM

## 2021-08-15 NOTE — Progress Notes (Signed)
Pt discharged to lobby. Pt was stable and appreciative at that time. All papers and prescriptions were given and valuables returned. Verbal understanding expressed. Denies SI/HI and A/VH. Pt given opportunity to express concerns and ask questions.  

## 2021-08-15 NOTE — Plan of Care (Signed)
Patient showed some improvement in social interactions with peers and staff at completion of recreation therapy group sessions.   Victorino Sparrow, LRT,CTRS

## 2021-08-15 NOTE — Progress Notes (Signed)
°   08/15/21 0530  Sleep  Number of Hours 7.25
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
## 2021-08-15 NOTE — [facility] Group Notes (Signed)
[facility] Group Notes:  (Nursing/MHT/Case Management/Adjunct)  Date:  08/15/2021  Time:  9:55 AM  Type of Therapy:   Orientation/Goals group  Participation Level:  Active  Participation Quality:  Appropriate  Affect:  Appropriate  Cognitive:  Appropriate  Insight:  Appropriate  Engagement in Group:  Engaged  Modes of Intervention:  Discussion, Education, and Orientation  Summary of Progress/Problems: Pt goal for today is to have a full and productive day.   Jerry Garrison [name] 08/15/2021, 9:55 AM

## 2021-08-15 NOTE — ED Triage Notes (Signed)
Pt here asking for help to think, pt admits to smoking weed, pt ran out wanting to catch the bus. Provider came in to speak with pt.

## 2021-08-15 NOTE — ED Provider Notes (Signed)
[facility]    CSN: [id number] Arrival date & time: 08/15/21  1655      History   Chief Complaint No chief complaint on file.   HPI Jerry Garrison is a 21 y.o. male.   HPI  Called to the room to help triage and assessed the patient.  Patient states that he is in need of help learning how to think.  He states that he cannot think and this has been a longstanding problem for him.  He is here because he wants to learn how to think.  He admits to smoking weed and states that he took the bus from [place] to [place] because he wanted to chase women.  He denies any pain, SI, or HI when asked directly.  Patient has a history of mental health issues and was evaluated in [facility] emergency department with a subsequent admission to behavioral health on 08/05/2021 and 2-23 respectively.  There are notes in epic up until 08/13/2021 for behavioral health but it is restricted access.  It is unclear if the patient was discharged from behavioral health or he eloped.  Patient does have a strong odor of marijuana on his person.  Patient then said that he was going to leave and walked out of the urgent care to go catch the park bus.  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     Home Medications    Prior to Admission medications   Medication Sig Start Date End Date Taking? Authorizing Provider  OLANZapine (ZYPREXA) 10 MG tablet Take 1 tablet (10 mg total) by mouth at bedtime. 08/15/21   Carlyn Reichert, MD  paliperidone (INVEGA) 6 MG 24 hr tablet Take 1 tablet (6 mg total) by mouth at bedtime for 5 days. 08/15/21 08/20/21  Carlyn Reichert, MD    Family History No family history on file.  Social History Social History   Tobacco Use   Smoking status: Never    Smokeless tobacco: Never  Vaping Use   Vaping Use: Never used  Substance Use Topics   Alcohol use: No   Drug use: Yes    Types: Marijuana     Allergies   Patient has no known allergies.   Review of Systems Review of Systems  Psychiatric/Behavioral:  Positive for behavioral problems and confusion. Negative for self-injury.     Physical Exam Triage Vital Signs ED Triage Vitals  Enc Vitals Group     BP      Pulse      Resp      Temp      Temp src      SpO2      Weight      Height      Head Circumference      Peak Flow      Pain Score      Pain Loc      Pain Edu?      Excl. in GC?    No data found.  Updated Vital Signs There were no vitals taken for this visit.  Visual Acuity Right Eye Distance:   Left Eye Distance:   Bilateral Distance:    Right Eye Near:   Left Eye Near:    Bilateral Near:  Physical Exam Vitals and nursing note reviewed.  Constitutional:      General: He is not in acute distress.    Appearance: Normal appearance.  HENT:     Head: Normocephalic and atraumatic.  Skin:    General: Skin is warm and dry.     Findings: No erythema or rash.  Neurological:     General: No focal deficit present.     Mental Status: He is alert and oriented to person, place, and time.  Psychiatric:        Mood and Affect: Mood normal.        Behavior: Behavior normal.        Thought Content: Thought content normal.        Judgment: Judgment normal.     UC Treatments / Results  Labs (all labs ordered are listed, but only abnormal results are displayed) Labs Reviewed - No data to display  EKG   Radiology No results found.  Procedures Procedures (including critical care time)  Medications Ordered in UC Medications - No data to display  Initial Impression / Assessment and Plan / UC Course  I have reviewed the triage vital signs and the nursing notes.  Pertinent labs & imaging results that were available during my care of the patient  were reviewed by me and considered in my medical decision making (see chart for details).  See the HPI above for details of urgent care course.  Patient left prior to finishing triage.   Final Clinical Impressions(s) / UC Diagnoses   Final diagnoses:  Bizarre behavior   Discharge Instructions   None    ED Prescriptions   None    PDMP not reviewed this encounter.   Becky Augusta, NP 08/15/21 [phone]

## 2021-08-15 NOTE — [facility] Suicide Risk Assessment (Signed)
[facility] Discharge Suicide Risk Assessment   Principal Problem: Schizophrenia [facility]) Discharge Diagnoses: Principal Problem:   Schizophrenia ([place])  During the patient's hospitalization, patient had extensive initial psychiatric evaluation, and follow-up psychiatric evaluations every day.  Psychiatric diagnoses provided upon initial assessment:  Schizophrenia  Upon further evaluation the diagnoses were given as follows:  Schizophrenia Cannabis use disorder  Patient's psychiatric medications were adjusted on admission:  -Decrease prior to admission Zyprexa 15 mg QHS to 5 mg QHS -Start Invega PO 6 mg QHS  During the hospitalization, other adjustments were made to the patient's psychiatric medication regimen:  -Increase Zyprexa to 10 mg QHS -Invega PO increased to 12 mg for several days -Given Invega Sustenna 234 mg on 2/8. Due for 156 mg shot on 2/15.  -After invega sustenna was administered, Invega PO dose was decreased to 6 mg for a total of 7 days (ending 2/15)  Gradually, patient started adjusting to milieu.   Patient's care was discussed during the interdisciplinary team meeting every day during the hospitalization.  The patient denied having side effects to prescribed psychiatric medication.  The patient reports their target psychiatric symptoms of psychosis responded well to the psychiatric medications, and the patient reports overall benefit other psychiatric hospitalization. Supportive psychotherapy was provided to the patient. The patient also participated in regular group therapy while admitted.   Labs were reviewed with the patient, and abnormal results were discussed with the patient.  The patient denied having suicidal thoughts more than 48 hours prior to discharge.  Patient denies having homicidal thoughts.  Patient denies having auditory hallucinations.  Patient denies any visual hallucinations.  Patient denies having paranoid thoughts.  The patient is able to verbalize  their individual safety plan to this provider.  It is recommended to the patient to continue psychiatric medications as prescribed, after discharge from the hospital.    It is recommended to the patient to follow up with your outpatient psychiatric provider and PCP.  Discussed with the patient, the impact of alcohol, drugs, tobacco have been there overall psychiatric and medical wellbeing, and total abstinence from substance use was recommended the patient.    Psychiatric Specialty Exam: Physical Exam Vitals and nursing note reviewed.  HENT:     Head: Normocephalic.  Pulmonary:     Effort: Pulmonary effort is normal.  Neurological:     General: No focal deficit present.     Mental Status: He is alert.     Review of Systems  Respiratory:  Negative for shortness of breath.   Cardiovascular:  Negative for chest pain.  Gastrointestinal:  Negative for diarrhea, nausea and vomiting.   Blood pressure 124/85, pulse 89, temperature 97.6 F (36.4 C), temperature source Oral, resp. rate 16, height 5\' 10"  (1.778 m), weight 59.4 kg, SpO2 100 %.Body mass index is 18.8 kg/m.  General Appearance: young male wearing a Tupac shirt  Eye Contact:  good  Speech:  normal  Volume:  normal  Mood:  "good"  Affect:  euthymic, does not laugh inappropriately, more calm and expressive  Thought Process:  concrete and linear  Orientation: oriented to self, year, month and city  Thought Content:  patient denied SI/HI/AVH, delusions, paranoia, first rank symptoms. Patient is not grossly responding to internal/external stimuli on exam and did not make delusional statements.    Suicidal Thoughts:  No  Homicidal Thoughts:  No  Memory: fair  Judgement:  fair  Insight:  fair  Psychomotor Activity: Normal  Concentration:  fair  Recall:  fair  Fund of Knowledge:  fair  Language:  Fair  Akathisia:  Negative  -AIMS: 0 (2/7, 2/10) -No orofacial movements noted -No cogwheeling or rigidity, no tremor or  bradykinesia    Assets:  Desire for Improvement Resilience  ADL's:  independent    Sleep:  fair    Mental Status Per Nursing Assessment::   On Admission:  Suicide plan  Demographic Factors:  Male, Adolescent or young adult, Low socioeconomic status, and Unemployed  Loss Factors: NA  Historical Factors: NA history of medication non adherence.   Risk Reduction Factors:   NA  Continued Clinical Symptoms:  Schizophrenia:   Less than 44 years old  Cognitive Features That Contribute To Risk:  None    Suicide Risk:  Minimal: Patient presented with suicidal statements in the context of substance use and psychosis. No identifiable suicidal ideation once restored. Patient has demonstrated good emotional regulation and behavioral control while on the unit. Unfortunately he has few protective factors and a poor social circumstance.    Follow-up Information     Gwendalyn Ege, MD Follow up.   Specialty: Behavioral Health Why: A referral has been made to this provider for medication management services.  Please call to schedule an appointment.  (AttnLeafy Ro) Contact information: [address] [place] [address] [place]. Call.   Specialty: Behavioral Health Why: A referral has been made to this provider for therapy and/or medication management services.  Please call to schedule an appointment and possibly to pay any necessary initial be to become established. Contact information: [place] [address] [phone]                 Plan Of Care/Follow-up recommendations:   Activity: as tolerated  Diet: heart healthy  Other: -Follow-up with your outpatient psychiatric provider -instructions on appointment date, time, and address (location) are provided to you in discharge paperwork.  -Take your psychiatric medications as prescribed at discharge - instructions are  provided to you in the discharge paperwork -Next invega sustenna 156 mg once daily is due on 08-20-2021. -Stop Invega oral medication on 08-20-21, or as instructed by your outpatient psychiatrist.   -Follow-up with outpatient primary care doctor and other specialists -for management of chronic medical disease.  -Testing: Follow-up with outpatient provider for abnormal lab results: none  -Recommend abstinence from alcohol, tobacco, and other illicit drug use at discharge.   -If your psychiatric symptoms recur, worsen, or if you have side effects to your psychiatric medications, call your outpatient psychiatric provider, [phone] or go to the nearest emergency department.  -If suicidal thoughts occur, call your outpatient psychiatric provider, 911, [phone] or go to the nearest emergency department.   Corky Sox, MD PGY-1  Total Time Spent in Direct Patient Care:  I personally spent 45 minutes on the unit in direct patient care. The direct patient care time included face-to-face time with the patient, reviewing the patient's chart, communicating with other professionals, and coordinating care. Greater than 50% of this time was spent in counseling or coordinating care with the patient regarding goals of hospitalization, psycho-education, and discharge planning needs.  On my assessment the patient denied SI, HI, AVH, paranoia, ideas of reference, or first rank symptoms on day of discharge. Patient denied drug cravings or active signs of withdrawal. Patient denied medication side-effects. Patient was not deemed to be a danger to self or others on day  of discharge and was in agreement with discharge plans.   I have independently evaluated the patient during a face-to-face assessment on the day of discharge. I reviewed the patient's chart, and I participated in key portions of the service. I discussed the case with the resident physician, and I agree with the assessment and plan of care as documented in  the resident physician's note, as addended by me or notated below:  I agree with the [facility] and discharge plan.   Janine Limbo, MD Psychiatrist

## 2021-08-15 NOTE — [facility] Suicide Risk Assessment (Signed)
[facility] INPATIENT:  Family/Significant Other Suicide Prevention Education  Suicide Prevention Education:  Patient Refusal for Family/Significant Other Suicide Prevention Education: The patient Jerry Garrison has refused to provide written consent for family/significant other to be provided Family/Significant Other Suicide Prevention Education during admission and/or prior to discharge.  Physician notified.  Jerry Garrison 08/15/2021, 9:49 AM

## 2021-08-15 NOTE — Progress Notes (Signed)
Recreation Therapy Notes  INPATIENT RECREATION TR PLAN  Patient Details Name: Jerry Garrison MRN: [id number] DOB: [birth year] Today's Date: 08/15/2021  Rec Therapy Plan Is patient appropriate for Therapeutic Recreation?: Yes Treatment times per week: about 3 days Estimated Length of Stay: 5-7 days TR Treatment/Interventions: Group participation (Comment)  Discharge Criteria Pt will be discharged from therapy if:: Discharged Treatment plan/goals/alternatives discussed and agreed upon by:: Patient/family  Discharge Summary Short term goals set: See patient care plan Short term goals met: Adequate for discharge Progress toward goals comments: Groups attended Which groups?: Other (Comment) (Problem Solving, Team Building and Archivist) Reason goals not met: Patient needed to attend more and open up more in recreation theapy group sessionsl Therapeutic equipment acquired: N/A Reason patient discharged from therapy: Discharge from hospital Pt/family agrees with progress & goals achieved: Yes Date patient discharged from therapy: 08/15/21    Victorino Sparrow, Vickki Muff, Amberlea Spagnuolo A 08/15/2021, 1:00 PM

## 2021-09-01 ENCOUNTER — Other Ambulatory Visit: Payer: Self-pay

## 2021-09-01 ENCOUNTER — Ambulatory Visit (INDEPENDENT_AMBULATORY_CARE_PROVIDER_SITE_OTHER): Payer: No Payment, Other | Admitting: *Deleted

## 2021-09-01 ENCOUNTER — Encounter (HOSPITAL_COMMUNITY): Payer: Self-pay | Admitting: Psychiatry

## 2021-09-01 ENCOUNTER — Ambulatory Visit (INDEPENDENT_AMBULATORY_CARE_PROVIDER_SITE_OTHER): Payer: No Payment, Other | Admitting: Licensed Clinical Social Worker

## 2021-09-01 ENCOUNTER — Ambulatory Visit (INDEPENDENT_AMBULATORY_CARE_PROVIDER_SITE_OTHER): Payer: No Payment, Other | Admitting: Psychiatry

## 2021-09-01 VITALS — BP 122/73 | HR 71 | Ht 70.0 in | Wt 140.0 lb

## 2021-09-01 DIAGNOSIS — F259 Schizoaffective disorder, unspecified: Secondary | ICD-10-CM | POA: Diagnosis not present

## 2021-09-01 MED ORDER — PALIPERIDONE PALMITATE ER 156 MG/ML IM SUSY
156.0000 mg | PREFILLED_SYRINGE | INTRAMUSCULAR | Status: DC
  Administered 2021-09-01: 156 mg via INTRAMUSCULAR

## 2021-09-01 MED ORDER — TRAZODONE HCL 50 MG PO TABS: 50.0000 mg | ORAL_TABLET | Freq: Every day | ORAL | 3 refills | Status: DC

## 2021-09-01 MED ORDER — INVEGA SUSTENNA 156 MG/ML IM SUSY: 156.0000 mg | PREFILLED_SYRINGE | INTRAMUSCULAR | 11 refills | Status: DC

## 2021-09-01 NOTE — Progress Notes (Signed)
Psychiatric Initial Adult Assessment   Patient Identification: Jerry Garrison MRN:  [id number] Date of Evaluation:  09/01/2021 Referral Source: Walk-in Chief Complaint:  " I do not feel comfortable in my skin and had loss Zyprexa"  Visit Diagnosis:    ICD-10-CM   1. Schizo affective schizophrenia ([place])  F25.9 traZODone (DESYREL) 50 MG tablet    paliperidone (INVEGA SUSTENNA) 156 MG/ML SUSY injection      History of Present Illness: 21 year old male seen today for initial psychiatric evaluation.  He walked into the clinic for medication management.  Patient was recently hospitalized at [facility] from 08/07/2021 through 08/15/2021 where he presented with symptoms of schizophrenia.  He has a psychiatric history of brief psychotic disorder, schizoaffective disorder, and marijuana use.  Currently he is managed on Zyprexa 10 mg daily and Invega 156 mg.  Patient received his first Invega injection on of invega 256 mg on 08/13/2020.  He was due to follow-up on 08/20/2020 however returns today for maintenance injection.  Patient informed Probation officer that he is not taking Zyprexa since his hospitalization as he lost it.  He notes Invega somewhat effective in managing his psychiatric conditions.  Today he is well-groomed, pleasant, cooperative, engaged in conversation.  He informed Probation officer that at times he does not feel comfortable in his skin.  He notes that his family is religious and notes that he no longer is.  He reports that he wants to pursue her dream of becoming a rapper however is unsure how to do that.  He also notes that he has been saddened recently because the mother of his child took his son away and he has not been able to see him in 2 years.  He notes that the mother of his child was concerned for his mental health and took their child away due to safety concerns.  He notes that he would like to petition the courts so that he can have some visitation rights.  Patient notes that he is anxious most days and notes  he copes with his onset anxiety by smoking marijuana daily.  He notes that he was worried about his mental health, his finances, his future.  Writer conducted a GAD-7 and patient scored a 16.  He notes that he wants to appear " Street" so that he will be more successful in the rap industry.  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  He does currently work at [employer] but notes that he needs more money to pursue his dreams.  Provider informed patient that marijuana can exacerbate his mental health condition.  He endorsed understanding however notes that he finds it effective.  Patient was seen with his mother who notes that he becomes paranoid and psychotic while on marijuana.  Provider encouraged patient to discontinue/reduce his consumption of marijuana.  He does endorse racing thoughts, distractibility, irritability, and at times fluctuations in his mood.  He notes that he tries to internalize these feelings so that he will have the respect of his family.  A PHQ-9 was conducted by patient's counselor and he scored an 105.  He endorses fluctuations in sleep noting that he sleeps 5 hours nightly.  He endorses adequate appetite.  Patient endorsed auditory hallucinations.  He informed Probation officer that the TV at times talks to him by instructing him to sit down or do things around the house.  Patient notes  that Zyprexa over sedating him and informed writer that at this time he does not want to restart it.  Provider recommended starting Zyprexa and lower dose however he was not agreeable.  Provider asked patient if he wanted hydroxyzine to help manage his anxiety however he notes that he did not want the medication.  He was agreeable to starting trazodone 50 mg nightly to help manage sleep, anxiety, and depression.Potential side effects of medication and risks vs benefits of  treatment vs non-treatment were explained and discussed. All questions were answered.  He will follow-up with outpatient counseling for therapy.  No other concerns at this time.   Associated Signs/Symptoms: Depression Symptoms:  depressed mood, anhedonia, insomnia, psychomotor agitation, feelings of worthlessness/guilt, difficulty concentrating, impaired memory, anxiety, weight gain, increased appetite, (Hypo) Manic Symptoms:  Elevated Mood, Flight of Ideas, Hallucinations, Irritable Mood, Anxiety Symptoms:  Excessive Worry, Psychotic Symptoms:  Hallucinations: Auditory PTSD Symptoms: NA  Past Psychiatric History: Brief psychotic disorder, schizoaffective disorder, and marijuana use  Previous Psychotropic Medications:  Zyprexa, Invega, hydroxyzine, Cogentin  Substance Abuse History in the last 12 months:  Yes.    Consequences of Substance Abuse: Medical Consequences:  Psychosis  Past Medical History:  Past Medical History:  Diagnosis Date   Schizophrenia ([place])    History reviewed. No pertinent surgical history.  Family Psychiatric History: Paternal great uncle Schizophrenia, maternal aunt TBI  Family History: History reviewed. No pertinent family history.  Social History:   Social History   Socioeconomic History   Marital status: Single    Spouse name: Not on file   Number of children: Not on file   Years of education: Not on file   Highest education level: Not on file  Occupational History   Not on file  Tobacco Use   Smoking status: Never   Smokeless tobacco: Never  Vaping Use   Vaping Use: Never used  Substance and Sexual Activity   Alcohol use: No   Drug use: Yes    Types: Marijuana   Sexual activity: Not Currently  Other Topics Concern   Not on file  Social History Narrative   Not on file   Social Determinants of Health   Financial Resource Strain: Not on file  Food Insecurity: Not on file  Transportation Needs: Not on file  Physical  Activity: Not on file  Stress: Not on file  Social Connections: Not on file    Additional Social History: Patient resides in [place] with his mother.  He works at [employer] divided. He is single and has 82 son (109years old) who he has not seen in 2 years. He denies alcohol or tobacco use.  He notes that he uses marijuana daily.  He denies other illegal drug use.  Allergies:  No Known Allergies  Metabolic Disorder Labs: Lab Results  Component Value Date   HGBA1C 4.4 (L) 08/07/2021   MPG 79.58 08/07/2021   No results found for: PROLACTIN Lab Results  Component Value Date   CHOL 135 08/07/2021   TRIG 26 08/07/2021   HDL 52 08/07/2021   CHOLHDL 2.6 08/07/2021   VLDL 5 08/07/2021   LDLCALC 78 08/07/2021   Lab Results  Component Value Date   TSH 1.866 08/07/2021    Therapeutic Level Labs: No results found for: LITHIUM No results found for: CBMZ No results found for: VALPROATE  Current Medications: Current Outpatient Medications  Medication Sig Dispense Refill   paliperidone (INVEGA SUSTENNA) 156 MG/ML SUSY injection Inject 1 mL (156 mg total) into  the muscle every 28 (twenty-eight) days. 1 mL 11   traZODone (DESYREL) 50 MG tablet Take 1 tablet (50 mg total) by mouth at bedtime. 30 tablet 3   Current Facility-Administered Medications  Medication Dose Route Frequency Provider Last Rate Last Admin   paliperidone (INVEGA SUSTENNA) injection 156 mg  156 mg Intramuscular Q28 days Eulis Canner E, NP   156 mg at 09/01/21 1230    Musculoskeletal: Strength & Muscle Tone: within normal limits Gait & Station: normal Patient leans: N/A  Psychiatric Specialty Exam: Review of Systems  There were no vitals taken for this visit.There is no height or weight on file to calculate BMI.  General Appearance: Well Groomed  Eye Contact:  Good  Speech:  Clear and Coherent and Normal Rate  Volume:  Normal  Mood:  Anxious and Depressed  Affect:  Appropriate and Congruent  Thought Process:   Coherent, Goal Directed, and Linear  Orientation:  Full (Time, Place, and Person)  Thought Content:  WDL and Hallucinations: Auditory  Suicidal Thoughts:  No  Homicidal Thoughts:  No  Memory:  Immediate;   Good Recent;   Good Remote;   Fair  Judgement:  Fair  Insight:  Fair  Psychomotor Activity:  Normal  Concentration:  Concentration: Fair and Attention Span: Fair  Recall:  Good  Fund of Knowledge:Good  Language: Good  Akathisia:  No  Handed:  Right  AIMS (if indicated):  not done  Assets:  Communication Skills Desire for Improvement Financial Resources/Insurance Housing Physical Health Social Support  ADL's:  Intact  Cognition: WNL  Sleep:  Fair   Screenings: AIMS    Flowsheet Row Admission (Discharged) from 08/07/2021 in [facility]  AIMS Total Score 0      AUDIT    Flowsheet Row Admission (Discharged) from 08/07/2021 in [facility]  Alcohol Use Disorder Identification Test Final Score (AUDIT) 0      GAD-7    Flowsheet Row Clinical Support from 09/01/2021 in [facility]  Total GAD-7 Score 16      PHQ2-9    Flowsheet Row Counselor from 09/01/2021 in [facility]  PHQ-2 Total Score 4  PHQ-9 Total Score 11      Flowsheet Row Counselor from 09/01/2021 in [facility] Admission (Discharged) from 08/07/2021 in [facility] ED from 08/05/2021 in [place] DEPT  C-SSRS RISK CATEGORY Moderate Risk High Risk High Risk       Assessment and Plan: Patient endorses symptoms are psychosis, anxiety, depression, and marijuana use.  At this time he does not want to restart Zyprexa.  He also notes that he does not want hydroxyzine to help manage anxiety.  He was agreeable to starting trazodone 50 mg nightly as needed to help manage sleep.  He will continue Invega  as prescribed.  He received his first maintenance injection of 156 mg today and will follow-up in a month for further injections.  Provider encouraged patient to reduce/discontinue his marijuana consumption as it could be exacerbating his mental health.   1. Schizo affective schizophrenia ([place])  Start- traZODone (DESYREL) 50 MG tablet; Take 1 tablet (50 mg total) by mouth at bedtime.  Dispense: 30 tablet; Refill: 3 Continue- paliperidone (INVEGA SUSTENNA) 156 MG/ML SUSY injection; Inject 1 mL (156 mg total) into the muscle every 28 (twenty-eight) days.  Dispense: 1 mL; Refill: 11  Collaboration of Care: Other patient  sees a counselor regularly at [facility] was advised Release of Information must be obtained prior to any record release in order to collaborate their care with an outside provider. Patient/Guardian was advised if they have not already done so to contact the registration department to sign all necessary forms in order for [place] to release information regarding their care.   Consent: Patient/Guardian gives verbal consent for treatment and assignment of benefits for services provided during this visit. Patient/Guardian expressed understanding and agreed to proceed.   Follow-up at [facility] in 1 month Follow-up with therapy  Salley Slaughter, NP 2/27/20231:40 PM

## 2021-09-01 NOTE — Progress Notes (Signed)
Comprehensive Clinical Assessment (CCA) Note  09/01/2021 Jerry Budzynski [id number]  Chief Complaint:  Chief Complaint  Patient presents with   Depression   Schizophrenia   Visit Diagnosis: Schizoaffective disorder   Client is a 21 year old male. Client is referred by [facility]  for schizophrenia.   Client states mental health symptoms as evidenced by:   Depression -- Hopelessness; Worthlessness; Sleep (too much or little)Depression. Hopelessness; Worthlessness; Sleep (too much or little). The comment is Pt reports that he sleeps excessively day and night.. Taken on 09/01/21 1115 Hopelessness; Worthlessness; Sleep (too much or little)Depression. Hopelessness; Worthlessness; Sleep (too much or little). The comment is Pt reports that he sleeps excessively day and night.. Last Filed Value  Duration of Depressive Symptoms -- -- Greater than two weeksDuration of Depressive Symptoms. Greater than two weeks. Data is from another encounter. Last Filed Value  Mania -- Irritability; Racing thoughts Irritability; Racing thoughtsMania. Irritability; Racing thoughts. Last Filed Value  Anxiety -- Difficulty concentrating; Fatigue; Restlessness; Worrying Difficulty concentrating; Fatigue; Restlessness; WorryingAnxiety. Difficulty concentrating; Fatigue; Restlessness; Worrying. Last Filed Value  Psychosis -- Hallucinations; Affective flattening/alogia/avolition Hallucinations; Affective flattening/alogia/avolitionPsychosis. Hallucinations; Affective flattening/alogia/avolition. Last Filed Value  Duration of Psychotic Symptoms -- Greater than six months Greater than six monthsDuration of Psychotic Symptoms. Greater than six months. Last Filed Value  Trauma -- NoneTrauma. None. The comment is UTA. Taken on 09/01/21 1115 NoneTrauma. None. The comment is UTA. Last Filed Value  Obsessions -- Disrupts routine/functioning; Recurrent & persistent thoughts/impulses/images; Poor insight Disrupts routine/functioning; Recurrent &  persistent thoughts/impulses/images; Poor insightObsessions. Disrupts routine/functioning; Recurrent & persistent thoughts/impulses/images; Poor insight. Last Filed Value  Compulsions -- "Driven" to perform behaviors/acts; Repeated behaviors/mental acts; Poor Insight "Driven" to perform behaviors/acts; Repeated behaviors/mental acts; Poor InsightCompulsions. "Driven" to perform behaviors/acts; Repeated behaviors/mental acts; Poor Insight. Last Filed Value  Inattention -- None NoneInattention. None. Last Filed Value  Hyperactivity/Impulsivity -- None NoneHyperactivity/Impulsivity. None. Last Filed Value  Oppositional/Defiant Behaviors -- None NoneOppositional/Defiant Behaviors. None. Last Filed Value  Emotional Irregularity -- Chronic feelings of emptiness; Recurrent suicidal behaviors/gestures/threats Chronic feelings of emptiness; Recurrent suicidal behaviors/gestures/threats     Client was screened for the following SDOH: Depression  Assessment Information that integrates subjective and objective details with a therapist's professional interpretation:    Patient was alert and oriented x5.  Patient was pleasant, cooperative, maintained good eye contact.  He engaged well in therapy session was dressed casually.  He presented today with anxious, flat, depressed mood\affect.  Patient comes in today as a behavioral health hospital discharge.  Patient reports history of hospitalization due to auditory hallucinations such as "TV tells me to do things such as I told me to leave here today".  LCSW explained the right to self-determination.  Patient was agreeable to stay and continue on with assessment.  Patient reports that things started around the time that his 2-year son was conceived.  Patient reports that he had his son due to a childish Gambino song and that is when he started to have unprotected sex.  Patient reports that he does now want to be a part of his son's life and wants to engage further with  mental health treatment.  Patient states that he was due for his Invega injection on 215 but did not follow-up with that.  LCSW made referral to [facility] injection clinic.  Patient will follow-up with this LCSW 1 time monthly.  Patient would like to work on things such as decreasing his PHQ-9.  Patient reports good support with  family such as a sister and also his mother who he currently lives with.  LCSW did bring his mother Jerry Garrison into session.  Patient's mother reports that he tends to wander and isolate himself when symptoms increase and medications are not maintained.   Client meets criteria for: Schizo affect disorder Client states use of the following substances: Marijuana   Clinician assisted client with scheduling the following appointments: Next available. Clinician details of appointment.    Client was in agreement with treatment recommendations. Nb  CCA Screening, Triage and Referral (STR)  Patient Reported Information How did you hear about [place]? Legal System  Referral name: No data recorded Referral phone number: No data recorded  Whom do you see for routine medical problems? No data recorded Practice/Facility Name: No data recorded Practice/Facility Phone Number: No data recorded Name of Contact: No data recorded Contact Number: No data recorded Contact Fax Number: No data recorded Prescriber Name: No data recorded Prescriber Address (if known): No data recorded  What Is the Reason for Your Visit/Call Today? SI  How Long Has This Been Causing You Problems? 1 wk - 1 month  What Do You Feel Would Help You the Most Today? Treatment for Depression or other mood problem; Medication(s)   Have You Recently Been in Any Inpatient Treatment (Hospital/Detox/Crisis Center/28-Day Program)? Yes  Name/Location of Program/Hospital:[facility]  How Long Were You There? 1 week  When Were You Discharged? No data recorded  Have You Ever  Received Services From [facility] Before? Yes  Who Do You See at [facility]? No data recorded  Have You Recently Had Any Thoughts About Hurting Yourself? Yes  Are You Planning to Commit Suicide/Harm Yourself At This time? No   Have you Recently Had Thoughts About [place]? No  Explanation: No data recorded  Have You Used Any Alcohol or Drugs in the Past 24 Hours? Yes  How Long Ago Did You Use Drugs or Alcohol? No data recorded What Did You Use and How Much? Marijuana: blunts and joint a few hits   Do You Currently Have a Therapist/Psychiatrist? No  Name of Therapist/Psychiatrist: No data recorded  Have You Been Recently Discharged From Any Office Practice or Programs? No  Explanation of Discharge From Practice/Program: Discharged from [facility] 01/12/2021     CCA Screening Triage Referral Assessment Type of Contact: Face-to-Face  Is this Initial or Reassessment? Initial Assessment  Date Telepsych consult ordered in [facility]:  08/06/21  Time Telepsych consult ordered in [facility]:  1259   Patient Reported Information Reviewed? No data recorded Patient Left Without Being Seen? No data recorded Reason for Not Completing Assessment: Pt guarded and will not fully participate in treatment   Collateral Involvement: No collaeral involved.   Does Patient Have a Stage manager Guardian? No data recorded Name and Contact of Legal Guardian: No data recorded If Minor and Not Living with Parent(s), Who has Custody? n/a  Is CPS involved or ever been involved? Never  Is APS involved or ever been involved? Never   Patient Determined To Be At Risk for Harm To Self or Others Based on Review of Patient Reported Information or Presenting Complaint? No  Method: No data recorded Availability of Means: No data recorded Intent: No data recorded Notification Required: No data recorded Additional Information for Danger to Others Potential: No data recorded Additional  Comments for Danger to Others Potential: No data recorded Are There Guns or Other Weapons in Your Home? No data recorded Types  of Guns/Weapons: No data recorded Are These Weapons Safely Secured?                            No data recorded Who Could Verify You Are Able To Have These Secured: No data recorded Do You Have any Outstanding Charges, Pending Court Dates, Parole/Probation? No data recorded Contacted To Inform of Risk of Harm To Self or Others: Law Enforcement   [facility] of Assessment: [facility]   Does Patient Present under Involuntary Commitment? No  IVC Papers Initial File Date: 04/04/21   [place] of Residence: [place]   Patient Currently Receiving the Following Services: Medication Management; CD--IOP (Intensive Chemical Dependency Program); Individual Therapy   Determination of Need: Urgent (48 hours)   Options For Referral: [facility]     CCA Biopsychosocial Intake/Chief Complaint:  Pt reports that he was placed on an injection at [facility] and wants to be estblished at [facility] for those injections.  Current Symptoms/Problems: flat affect, worthlessness, hoplessness, confusion, "TV told me I should leave today", poor self esteem, poor self image,   Patient Reported Schizophrenia/Schizoaffective Diagnosis in Past: Yes   Strengths: willing to engage in treatment  Preferences: therapy and medication mgnt  Abilities: No data recorded  Type of Services Patient Feels are Needed: monthly injection.   Initial Clinical Notes/Concerns: AH and ideas of refrence.   Mental Health Symptoms Depression:   Hopelessness; Worthlessness; Sleep (too much or little) (Pt reports that he sleeps excessively day and night.)   Duration of Depressive symptoms:  Greater than two weeks   Mania:   Irritability; Racing thoughts   Anxiety:    Difficulty concentrating; Fatigue; Restlessness; Worrying   Psychosis:   Hallucinations; Affective  flattening/alogia/avolition   Duration of Psychotic symptoms:  Greater than six months   Trauma:   None (UTA)   Obsessions:   Disrupts routine/functioning; Recurrent & persistent thoughts/impulses/images; Poor insight   Compulsions:   "Driven" to perform behaviors/acts; Repeated behaviors/mental acts; Poor Insight   Inattention:   None   Hyperactivity/Impulsivity:   None   Oppositional/Defiant Behaviors:   None   Emotional Irregularity:   Chronic feelings of emptiness; Recurrent suicidal behaviors/gestures/threats   Other Mood/Personality Symptoms:   depressed    Mental Status Exam Appearance and self-care  Stature:   Average   Weight:   Average weight   Clothing:   -- (Pt drerssed ins scrubs.)   Grooming:   Normal   Cosmetic use:   Age appropriate   Posture/gait:   Normal   Motor activity:   Restless   Sensorium  Attention:   Distractible   Concentration:   Focuses on irrelevancies   Orientation:   Object; Person; Place; Situation; X5   Recall/memory:   Normal   Affect and Mood  Affect:   Anxious   Mood:   Dysphoric; Worthless; Hopeless   Relating  Eye contact:   Avoided   Facial expression:   Responsive; Anxious   Attitude toward examiner:   Cooperative   Thought and Language  Speech flow:  Clear and Coherent   Thought content:   Ideas of Influence; Suspicious   Preoccupation:   Guilt   Hallucinations:   Auditory   Organization:  No data recorded  [employer] of Knowledge:   Fair   Intelligence:   Average   Abstraction:   Abstract   Judgement:   Impaired   Reality Testing:  Distorted   Insight:   Lacking   Decision Making:   Impulsive   Social Functioning  Social Maturity:   Impulsive   Social Judgement:   Impropriety; Naive   Stress  Stressors:   Housing   Coping Ability:   Overwhelmed; Deficient supports   Skill Deficits:   Decision making; Self-care   Supports:    Support needed     Religion: Religion/Spirituality Are You A Religious Person?: No (UTA) How Might This Affect Treatment?: UTA  Leisure/Recreation: Leisure / Recreation Do You Have Hobbies?: Yes Leisure and Hobbies: Music, both listening to and making  Exercise/Diet: Exercise/Diet Do You Exercise?: No Have You Gained or Lost A Significant Amount of Weight in the Past Six Months?: No Do You Follow a Special Diet?: No Do You Have Any Trouble Sleeping?: Yes Explanation of Sleeping Difficulties: Pt reports that he sleeps excessivey night and day.   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 How Long has Patient Been Employed?: 1 week Are You Satisfied With Your Job?: Yes Do You Work More Than One Job?: No Patient's Job has Been Impacted by Current Illness: No What is the Longest Time Patient has Held a Job?: 2 years Where was the Patient Employed at that Time?: [employer] Has Patient ever Been in the [employer]?: No  Education: Education Last Grade Completed: 12 Did Teacher, adult education From [employer]?: Yes Did Physicist, medical?: Yes What Type of College Degree Do you Have?: did not finish Did You Have An Individualized Education Program (IIEP): No Did You Have Any Difficulty At School?: No   CCA Family/Childhood History Family and Relationship History: Family history Marital status: Single Are you sexually active?: No What is your sexual orientation?: Straight Has your sexual activity been affected by drugs, alcohol, medication, or emotional stress?: no Does patient have children?: Yes How many children?: 1 How is patient's relationship with their children?: distant relationship, 78 year old  Childhood History:  Childhood History By whom was/is the patient raised?: Mother Additional childhood history information: Patient states that he had a great  childhood and had lots of siblings. Patient states that he was free to do what he wants Description of patient's relationship with caregiver when they were a child: I think it was good Patient's description of current relationship with people who raised him/her: shaky How were you disciplined when you got in trouble as a child/adolescent?: Patient states that he rarely was in trouble- wasn't disciplined much Does patient have siblings?: Yes Description of patient's current relationship with siblings: patient reports that his relationship right now is distant with siblings Did patient suffer any verbal/emotional/physical/sexual abuse as a child?: No Did patient suffer from severe childhood neglect?: No Patient description of severe childhood neglect: UTA Has patient ever been sexually abused/assaulted/raped as an adolescent or adult?: No Was the patient ever a victim of a crime or a disaster?: No Witnessed domestic violence?: No Has patient been affected by domestic violence as an adult?: No  Child/Adolescent Assessment:     CCA Substance Use Alcohol/Drug Use:                           ASAM's:  Six Dimensions of Multidimensional Assessment  Dimension 1:  Acute Intoxication and/or Withdrawal Potential:      Dimension 2:  Biomedical Conditions and Complications:      Dimension 3:  Emotional, Behavioral, or Cognitive  Conditions and Complications:     Dimension 4:  Readiness to Change:     Dimension 5:  Relapse, Continued use, or Continued Problem Potential:     Dimension 6:  Recovery/Living Environment:     ASAM Severity Score:    ASAM Recommended Level of Treatment:     Substance use Disorder (SUD)    Recommendations for Services/Supports/Treatments:    DSM5 Diagnoses: Patient Active Problem List   Diagnosis Date Noted   Schizophrenia ([place]) 08/07/2021   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 Patient/Guardian was advised if they have not already done so to contact the registration department to sign all necessary forms in order for [place] to release information regarding their care.   Consent: Patient/Guardian gives verbal consent for treatment and assignment of benefits for services provided during this visit. Patient/Guardian expressed understanding and agreed to proceed.   Dory Horn, LCSW

## 2021-09-01 NOTE — Progress Notes (Signed)
PATIENT DID ARRIVE WITH MOM FOR INVEGA 156 INJECTION.Marland Kitchen PATIENT HAD PREVIOUSLY RECEIVED LOADING DOSE OF INVEGA 234 ON 08/13/21. DR PARSONS SPOKE WITH PATIENT & MOM . PATIENT TOLERATED INJECTION WELL  IN RIGHT-ARM. PATIENT & MOM ARE  SO VERY PLEASANT.

## 2021-09-01 NOTE — Plan of Care (Signed)
Pt agreeable to plan  ?

## 2021-09-11 ENCOUNTER — Encounter (HOSPITAL_COMMUNITY): Payer: No Payment, Other

## 2021-09-11 ENCOUNTER — Ambulatory Visit (HOSPITAL_COMMUNITY): Payer: Self-pay

## 2021-09-30 ENCOUNTER — Ambulatory Visit (HOSPITAL_COMMUNITY): Payer: BLUE CROSS/BLUE SHIELD

## 2021-10-20 ENCOUNTER — Ambulatory Visit (HOSPITAL_COMMUNITY)
Admission: RE | Admit: 2021-10-20 | Discharge: 2021-10-20 | Disposition: A | Discharge status: Home / Self Care | Payer: BLUE CROSS/BLUE SHIELD | Attending: Psychiatry | Admitting: Psychiatry

## 2021-10-20 DIAGNOSIS — F259 Schizoaffective disorder, unspecified: Secondary | ICD-10-CM | POA: Diagnosis present

## 2021-10-20 MED ORDER — PALIPERIDONE PALMITATE ER 156 MG/ML IM SUSY
156.0000 mg | PREFILLED_SYRINGE | Freq: Once | INTRAMUSCULAR | Status: AC
  Administered 2021-10-20: 156 mg via INTRAMUSCULAR

## 2021-10-20 NOTE — BH Assessment (Signed)
Patient is a 21 year old male that presents this date as a walk to [facility] with his mother Benard Minturn) who is requesting assistance with getting patient his monthly injection of Invega (See MAR). Patient has a history significant for schizoaffective disorder and had been receiving services from [place] [facility] who assisted with medication management. Patient denies any S/I, H/I or AVH this date. Per mother and chart review patient last received his injection on 09/01/21. Mother reports that she is in the process of acquiring Medicaid for the patient and has also completed paper work for a medication assistance program which has yet to be approved. Patient's mother states that the medication is over one thousand dollars which she cannot afford. Ntuen FNP evaluated patient and consulted with on site pharmacist who reported she had samples of that medication. Patient was provided injection and mother was advised that they would in the future have to receive those services from provider at [facility]. Patient and mother voiced understanding.  ?

## 2021-10-20 NOTE — H&P (Signed)
Behavioral Health Medical Screening Exam ? ?HPI: Jerry Garrison is a 21 y.o. male who presents voluntarily to [facility] as a walk-in accompanied by his mother for increasing commands auditory hallucinations. Last night, patient heard the internal auditory commands to hide in the [facility] green trash can in other to hide from his enemies. Patient has psychiatric history of threatening other, Brief psychotic disorder, Schizophrenia and Schizo-affective schizophrenia. Patient lives at home with his mother and other siblings. Patient has several ED visits in the last 6 months and one admission to [facility] in 08/07/21 to 08/16/21. ? ?Patient and mother reported that patient was prescribed invega sustenna to be given every 28 days and last injection was on 09/01/21.  Mother stated that medication is very expensive, even with insurance she still has to pay $1,000 and cannot afford it at this time.  Reported that patient is seeing a therapist at [facility] [place]. This therapist inform Jerry Garrison (Mother) at [phone] that he would see patient unless he received his monthly Invega shot. Denied intentional self injurious behavior, SI/HI. Endorsed auditory hallucinations with commands. Endorsed prior suicidal attempt  from commands auditory hallucination. Slept for at least 5 hours last night. Support system form mother a siblings. Mother stated that patient father died from cancer when patient was 62 months old. Family history of mental illness include maternal aunt  and uncle diagnosed with Schizophrenia. Denied alcohol dependency and admitted to drug use of "ICE" when prompted by mother. Endorsed tobacco use of 2 cigarette per day, vaping and smoking one blunt of marijuana per day. Instruction provided on cigarette cessation.Denied history of trauma or abuse nor access to fire arms.   ? ?On examination today, patient is sitting on a chair in the assessment room. Chart is reviewed and findings shared with the  treatment team and discussed with Dr. Lucianne Muss. Patient is alert and oriented to person, place, time of day but not to situation. Speech is fluent, guarded with mother reiterating for patient to tell the truth. Mood and affect is anxious, depressed and worthless. Patient has poor judgement and insight. Thought process is coherent and linear. Thought content is ruminating.  Dr. Lucianne Muss made aware of patient's needs and approved of Invega 156 mg/mL IM sample to be administered IM to patient. Order written and medication IM administered to left deltoid and tolerated well. Community resources provided on medication assistance program. Patient and mother left [facility] 15 minutes after medication administration without any incident. ? ?Total Time spent with patient: 1 hour ? ?Psychiatric Specialty Exam: ? ?Presentation  ?General Appearance: Appropriate for Environment; Casual ? ?Eye Contact:Fair ? ?Speech:Clear and Coherent; Slow ? ?Speech Volume:Decreased ? ?Handedness:Right ? ?Mood and Affect  ?Mood:Anxious; Depressed; Worthless ? ?Affect:Blunt; Depressed ? ?Thought Process  ?Thought Processes:Linear; Coherent ? ?Descriptions of Associations:Intact ? ?Orientation:Full (Time, Place and Person) ? ?Thought Content:Rumination ? ?History of Schizophrenia/Schizoaffective disorder:Yes ? ?Duration of Psychotic Symptoms:Greater than six months ? ?Hallucinations:Hallucinations: Auditory; Command ?Description of Command Hallucinations: Auditory hallucination commanding him to do "stuff." ?Description of Auditory Hallucinations: Last night with Commands to hide in the Large Trash can to escape his enemies. ? ?Ideas of Reference:None ? ?Suicidal Thoughts:Suicidal Thoughts: No ? ?Homicidal Thoughts:Homicidal Thoughts: No ? ?Sensorium  ?Memory:Immediate Fair; Recent Fair; Remote Fair ? ?Judgment:Poor ? ?Insight:Poor ? ?Executive Functions  ?Concentration:Fair ? ?Attention Span:Fair ? ?Recall:Fair ? ?Fund of  Knowledge:Fair ? ?Language:Fair ? ?Psychomotor Activity  ?Psychomotor Activity:Psychomotor Activity: Normal ? ?Assets  ?Assets:Communication Skills; Housing; Physical Health ? ?Sleep  ?  Sleep:Sleep: Good ?Number of Hours of Sleep: 5 ? ?Physical Exam: ?Physical Exam ?Vitals and nursing note reviewed.  ?HENT:  ?   Head: Normocephalic and atraumatic.  ?   Nose: Nose normal.  ?   Mouth/Throat:  ?   Mouth: Mucous membranes are moist.  ?   Pharynx: Oropharynx is clear.  ?Eyes:  ?   Extraocular Movements: Extraocular movements intact.  ?   Conjunctiva/sclera: Conjunctivae normal.  ?   Pupils: Pupils are equal, round, and reactive to light.  ?Cardiovascular:  ?   Rate and Rhythm: Normal rate.  ?   Pulses: Normal pulses.  ?Pulmonary:  ?   Effort: Pulmonary effort is normal.  ?Abdominal:  ?   Palpations: Abdomen is soft.  ?Genitourinary: ?   Comments: Deferred ?Musculoskeletal:     ?   General: Normal range of motion.  ?   Cervical back: Normal range of motion and neck supple.  ?Skin: ?   General: Skin is warm.  ?Neurological:  ?   General: No focal deficit present.  ?   Mental Status: He is oriented to person, place, and time.  ?Psychiatric:     ?   Behavior: Behavior normal.  ? ?Review of Systems  ?Constitutional: Negative.  Negative for chills and fever.  ?HENT: Negative.  Negative for hearing loss and tinnitus.   ?Eyes: Negative.  Negative for blurred vision and double vision.  ?Respiratory: Negative.  Negative for cough, sputum production, shortness of breath and wheezing.   ?Cardiovascular: Negative.  Negative for chest pain.  ?Gastrointestinal: Negative.  Negative for abdominal pain, constipation, diarrhea, heartburn, nausea and vomiting.  ?Genitourinary: Negative.  Negative for dysuria, frequency and urgency.  ?Musculoskeletal: Negative.  Negative for back pain, falls, joint pain, myalgias and neck pain.  ?Skin: Negative.  Negative for itching and rash.  ?Neurological:  Negative for dizziness, tingling, tremors,  sensory change, speech change, focal weakness, seizures, loss of consciousness, weakness and headaches.  ?Endo/Heme/Allergies: Negative.  Negative for environmental allergies and polydipsia. Does not bruise/bleed easily.  ?Psychiatric/Behavioral:  Positive for hallucinations (Due to inability to afford medication X 2 months.). The patient is nervous/anxious.   ?Blood pressure 124/76, pulse 90, temperature 98.1 ?F (36.7 ?C), temperature source Oral, resp. rate 18, SpO2 100 %. There is no height or weight on file to calculate BMI. ? ?Musculoskeletal: ?Strength & Muscle Tone: within normal limits ?Gait & Station: normal ?Patient leans: N/A ? ? ?Recommendations: ? ?Based on my evaluation, the patient appeared to have an emergency psychiatric condition for which I recommended the patient to receive Invega 156 mg/mL shot to prevent being in danger to self and others. ? ? ?Cecilie Lowers, FNP ?10/20/2021, 12:57 PM ? ?

## 2021-10-21 ENCOUNTER — Ambulatory Visit (HOSPITAL_COMMUNITY)
Admission: RE | Admit: 2021-10-21 | Discharge: 2021-10-21 | Disposition: A | Discharge status: Home / Self Care | Payer: BLUE CROSS/BLUE SHIELD | Attending: Psychiatry | Admitting: Psychiatry

## 2021-10-22 ENCOUNTER — Emergency Department (HOSPITAL_COMMUNITY): Payer: BLUE CROSS/BLUE SHIELD

## 2021-10-22 ENCOUNTER — Emergency Department (HOSPITAL_COMMUNITY)
Admission: EM | Admit: 2021-10-22 | Discharge: 2021-10-25 | Disposition: A | Discharge status: Other Acute Inpatient Hospital | Payer: BLUE CROSS/BLUE SHIELD | Attending: Emergency Medicine | Admitting: Emergency Medicine

## 2021-10-22 ENCOUNTER — Ambulatory Visit (HOSPITAL_COMMUNITY): Payer: BLUE CROSS/BLUE SHIELD | Admitting: Licensed Clinical Social Worker

## 2021-10-22 ENCOUNTER — Other Ambulatory Visit: Payer: Self-pay

## 2021-10-22 ENCOUNTER — Encounter (HOSPITAL_COMMUNITY): Payer: Self-pay

## 2021-10-22 DIAGNOSIS — R45851 Suicidal ideations: Secondary | ICD-10-CM | POA: Insufficient documentation

## 2021-10-22 DIAGNOSIS — R109 Unspecified abdominal pain: Secondary | ICD-10-CM | POA: Insufficient documentation

## 2021-10-22 DIAGNOSIS — F200 Paranoid schizophrenia: Secondary | ICD-10-CM | POA: Diagnosis present

## 2021-10-22 LAB — CBC WITH DIFFERENTIAL/PLATELET
Abs Immature Granulocytes: 0.01 10*3/uL (ref 0.00–0.07)
Basophils Absolute: 0 10*3/uL (ref 0.0–0.1)
Basophils Relative: 1 %
Eosinophils Absolute: 0.2 10*3/uL (ref 0.0–0.5)
Eosinophils Relative: 3 %
HCT: 42.9 % (ref 39.0–52.0)
Hemoglobin: 14.3 g/dL (ref 13.0–17.0)
Immature Granulocytes: 0 %
Lymphocytes Relative: 34 %
Lymphs Abs: 1.7 10*3/uL (ref 0.7–4.0)
MCH: 29.8 pg (ref 26.0–34.0)
MCHC: 33.3 g/dL (ref 30.0–36.0)
MCV: 89.4 fL (ref 80.0–100.0)
Monocytes Absolute: 0.8 10*3/uL (ref 0.1–1.0)
Monocytes Relative: 15 %
Neutro Abs: 2.4 10*3/uL (ref 1.7–7.7)
Neutrophils Relative %: 47 %
Platelets: 211 10*3/uL (ref 150–400)
RBC: 4.8 MIL/uL (ref 4.22–5.81)
RDW: 11.9 % (ref 11.5–15.5)
WBC: 5.1 10*3/uL (ref 4.0–10.5)
nRBC: 0 % (ref 0.0–0.2)

## 2021-10-22 LAB — SALICYLATE LEVEL: Salicylate Lvl: <7 mg/dL — ABNORMAL LOW (ref 7.0–30.0)

## 2021-10-22 LAB — COMPREHENSIVE METABOLIC PANEL
ALT: 44 U/L (ref 0–44)
AST: 74 U/L — ABNORMAL HIGH (ref 15–41)
Albumin: 4.3 g/dL (ref 3.5–5.0)
Alkaline Phosphatase: 63 U/L (ref 38–126)
Anion gap: 8 (ref 5–15)
BUN: 13 mg/dL (ref 6–20)
CO2: 24 mmol/L (ref 22–32)
Calcium: 9 mg/dL (ref 8.9–10.3)
Chloride: 105 mmol/L (ref 98–111)
Creatinine, Ser: 1.03 mg/dL (ref 0.61–1.24)
GFR, Estimated: >60 mL/min (ref >60)
Glucose, Bld: 89 mg/dL (ref 70–99)
Potassium: 3.3 mmol/L — ABNORMAL LOW (ref 3.5–5.1)
Sodium: 137 mmol/L (ref 135–145)
Total Bilirubin: 2 mg/dL — ABNORMAL HIGH (ref 0.3–1.2)
Total Protein: 7.5 g/dL (ref 6.5–8.1)

## 2021-10-22 LAB — LIPASE, BLOOD: Lipase: 38 U/L (ref 11–51)

## 2021-10-22 LAB — ETHANOL: Alcohol, Ethyl (B): <10 mg/dL (ref <10)

## 2021-10-22 LAB — ACETAMINOPHEN LEVEL: Acetaminophen (Tylenol), Serum: <10 ug/mL — ABNORMAL LOW (ref 10–30)

## 2021-10-22 MED ORDER — ZIPRASIDONE MESYLATE 20 MG IM SOLR
10.0000 mg | Freq: Once | INTRAMUSCULAR | Status: AC
  Administered 2021-10-22: 10 mg via INTRAMUSCULAR
  Filled 2021-10-22: qty 20

## 2021-10-22 NOTE — ED Notes (Signed)
IVC paperwork arrived.  ?

## 2021-10-22 NOTE — ED Triage Notes (Signed)
Pt presents to ED via [employer], pt's mother [facility]C at [facility]. Pt states he wants to talk to someone because he is fearful but does not elaborate. Hx schizophrenia.  ?

## 2021-10-22 NOTE — ED Notes (Signed)
Pt. In burgundy scrubs and wanded by security. Pt. Has 1 belongings bag. Pt. Has 1 pr. Black boots, no cell phone, no wallet, 1 black short, 1 black, 1 pr. Socks 1 black apron and 1 white/blue shirt. Pt. Belongings locked up behind the nurses station in triage. ?

## 2021-10-22 NOTE — ED Notes (Signed)
Pt. Made aware for the need of urine specimen. 

## 2021-10-23 DIAGNOSIS — F1520 Other stimulant dependence, uncomplicated: Secondary | ICD-10-CM | POA: Insufficient documentation

## 2021-10-23 LAB — RAPID URINE DRUG SCREEN, HOSP PERFORMED
Amphetamines: POSITIVE — AB
Barbiturates: NOT DETECTED
Benzodiazepines: NOT DETECTED
Cocaine: NOT DETECTED
Opiates: NOT DETECTED
Tetrahydrocannabinol: POSITIVE — AB

## 2021-10-23 MED ORDER — ACETAMINOPHEN 325 MG PO TABS: 650.0000 mg | ORAL_TABLET | ORAL | Status: DC | PRN

## 2021-10-23 MED ORDER — ALUM & MAG HYDROXIDE-SIMETH 200-200-20 MG/5ML PO SUSP: 30.0000 mL | Freq: Four times a day (QID) | ORAL | Status: DC | PRN

## 2021-10-23 MED ORDER — ONDANSETRON HCL 4 MG PO TABS: 4.0000 mg | ORAL_TABLET | Freq: Three times a day (TID) | ORAL | Status: DC | PRN

## 2021-10-23 NOTE — ED Notes (Signed)
Pt ambulatory to bathroom with no assistance. Gait steady.  

## 2021-10-23 NOTE — ED Provider Notes (Signed)
?[facility] DEPT ?Provider Note ? ? ?CSN: [id number] ?Arrival date & time: 10/22/21  2154 ? ?  ? ?History ? ?Chief Complaint  ?Patient presents with  ? Psychiatric Evaluation  ? ? ?Jerry Garrison is a 21 y.o. male. ? ? ?Mental Health Problem ?Presenting symptoms: bizarre behavior, hallucinations and suicidal thoughts   ?Degree of incapacity (severity):  Mild ?Onset quality:  Gradual ?Duration:  2 weeks ?Timing:  Constant ?Progression:  Worsening ?Chronicity:  Recurrent ?Context: alcohol use and drug abuse   ?Treatment compliance:  Some of the time ?Relieved by:  Nothing ?Worsened by:  Nothing ? ?  ? ?Home Medications ?Prior to Admission medications   ?Medication Sig Start Date End Date Taking? Authorizing Provider  ?paliperidone (INVEGA SUSTENNA) 156 MG/ML SUSY injection Inject 1 mL (156 mg total) into the muscle every 28 (twenty-eight) days. 09/01/21   Salley Slaughter, NP  ?traZODone (DESYREL) 50 MG tablet Take 1 tablet (50 mg total) by mouth at bedtime. 09/01/21   Salley Slaughter, NP  ?   ? ?Allergies    ?Patient has no known allergies.   ? ?Review of Systems   ?Review of Systems  ?Psychiatric/Behavioral:  Positive for hallucinations and suicidal ideas.   ? ?Physical Exam ?Updated Vital Signs ?BP 130/85 (BP Location: Left Arm)   Pulse 86   Temp 97.8 ?F (36.6 ?C) (Oral)   Resp 20   Ht 5\' 10"  (1.778 m)   Wt 63.5 kg   SpO2 100%   BMI 20.09 kg/m?  ?Physical Exam ?Vitals and nursing note reviewed.  ?Constitutional:   ?   Appearance: He is well-developed.  ?HENT:  ?   Head: Normocephalic and atraumatic.  ?   Nose: Nose normal. No congestion or rhinorrhea.  ?   Mouth/Throat:  ?   Mouth: Mucous membranes are moist.  ?   Pharynx: Oropharynx is clear.  ?Eyes:  ?   Pupils: Pupils are equal, round, and reactive to light.  ?Cardiovascular:  ?   Rate and Rhythm: Normal rate.  ?Pulmonary:  ?   Effort: Pulmonary effort is normal. No respiratory distress.  ?Abdominal:  ?   General: Abdomen  is flat. There is no distension.  ?Musculoskeletal:     ?   General: Normal range of motion.  ?   Cervical back: Normal range of motion.  ?Skin: ?   General: Skin is warm and dry.  ?Neurological:  ?   General: No focal deficit present.  ?   Mental Status: He is alert.  ? ? ?ED Results / Procedures / Treatments   ?Labs ?(all labs ordered are listed, but only abnormal results are displayed) ?Labs Reviewed  ?COMPREHENSIVE METABOLIC PANEL - Abnormal; Notable for the following components:  ?    Result Value  ? Potassium 3.3 (*)   ? AST 74 (*)   ? Total Bilirubin 2.0 (*)   ? All other components within normal limits  ?ACETAMINOPHEN LEVEL - Abnormal; Notable for the following components:  ? Acetaminophen (Tylenol), Serum <10 (*)   ? All other components within normal limits  ?SALICYLATE LEVEL - Abnormal; Notable for the following components:  ? Salicylate Lvl [id number] (*)   ? All other components within normal limits  ?ETHANOL  ?CBC WITH DIFFERENTIAL/PLATELET  ?LIPASE, BLOOD  ?RAPID URINE DRUG SCREEN, HOSP PERFORMED  ? ? ?EKG ?None ? ?Radiology ?CT Renal Stone Study ? ?Result Date: 10/22/2021 ?CLINICAL DATA:  Flank pain. EXAM: CT ABDOMEN AND PELVIS WITHOUT CONTRAST TECHNIQUE: Multidetector  CT imaging of the abdomen and pelvis was performed following the standard protocol without IV contrast. RADIATION DOSE REDUCTION: This exam was performed according to the departmental dose-optimization program which includes automated exposure control, adjustment of the mA and/or kV according to patient size and/or use of iterative reconstruction technique. COMPARISON:  Abdominal radiograph dated 09/22/2011. FINDINGS: Evaluation of this exam is very limited in the absence of intravenous contrast as well as paucity of abdominal fat and due to streak artifact caused by patient's arms. Lower chest: The visualized lung bases are clear. No intra-abdominal free air or free fluid. Hepatobiliary: No focal liver abnormality is seen. No gallstones,  gallbladder wall thickening, or biliary dilatation. Pancreas: Unremarkable. No pancreatic ductal dilatation or surrounding inflammatory changes. Spleen: Normal in size without focal abnormality. Adrenals/Urinary Tract: Adrenal glands are unremarkable. Kidneys are normal, without renal calculi, focal lesion, or hydronephrosis. Bladder is unremarkable. Stomach/Bowel: There is no bowel obstruction or active inflammation. The appendix is not visualized with certainty. No inflammatory changes identified in the right lower quadrant. Vascular/Lymphatic: The abdominal aorta and IVC are grossly unremarkable on this noncontrast CT. No portal venous gas. There is no adenopathy. Reproductive: The prostate and seminal vesicles are grossly unremarkable no pelvic mass. Other: None Musculoskeletal: No acute or significant osseous findings. IMPRESSION: No acute intra-abdominal or pelvic pathology. No hydronephrosis or nephrolithiasis. Electronically Signed   By: Anner Crete M.D.   On: 10/22/2021 23:53   ? ?Procedures ?Procedures  ? ? ?Medications Ordered in ED ?Medications  ?alum & mag hydroxide-simeth (MAALOX/MYLANTA) 200-200-20 MG/5ML suspension 30 mL (has no administration in time range)  ?ondansetron (ZOFRAN) tablet 4 mg (has no administration in time range)  ?acetaminophen (TYLENOL) tablet 650 mg (has no administration in time range)  ?ziprasidone (GEODON) injection 10 mg (10 mg Intramuscular Given 10/22/21 2348)  ? ? ?ED Course/ Medical Decision Making/ A&P ?  ?                        ?Medical Decision Making ?Amount and/or Complexity of Data Reviewed ?Labs: ordered. ?Radiology: ordered. ? ?Risk ?OTC drugs. ?Prescription drug management. ? ? ?24 hour exam papers filled out. Medically cleared for tts consultation.  ? ?Final Clinical Impression(s) / ED Diagnoses ?Final diagnoses:  ?None  ? ? ?Rx / DC Orders ?ED Discharge Orders   ? ? None  ? ?  ? ? ?  ?Merrily Pew, MD ?10/23/21 [id number] ? ?

## 2021-10-23 NOTE — ED Notes (Signed)
Pt awakened, this nurse went to introduce herself. Pt made eye contact, answered his name slowly when asked, did not respond to other questions but compliant with request to provide urine specimen ?

## 2021-10-23 NOTE — BH Assessment (Signed)
Comprehensive Clinical Assessment (CCA) Note ? ?10/23/2021 ?Sachin Gassner ?[id number] ? ?Per Beatriz Stallion, NP, patient is recommended for inpatient treatment ? ?The patient demonstrates the following risk factors for suicide: Chronic risk factors for suicide include: psychiatric disorder of schizophrenia and substance use disorder. Acute risk factors for suicide include: loss (financial, interpersonal, professional). Protective factors for this patient include: positive social support and hope for the future. Considering these factors, the overall suicide risk at this point appears to be moderate. Patient is not appropriate for outpatient follow up.  ? ?AIMS   ? ?Flowsheet Row Admission (Discharged) from 08/07/2021 in [facility]  ?AIMS Total Score 0  ? ?  ? ?AUDIT   ? ?Flowsheet Row Admission (Discharged) from 08/07/2021 in [facility]  ?Alcohol Use Disorder Identification Test Final Score (AUDIT) 0  ? ?  ? ?GAD-7   ? ?Flowsheet Row Clinical Support from 09/01/2021 in [facility]  ?Total GAD-7 Score 16  ? ?  ? ?PHQ2-9   ? ?[place] ED from 10/22/2021 in [place] DEPT Counselor from 09/01/2021 in [facility]  ?PHQ-2 Total Score 4 4  ?PHQ-9 Total Score 17 11  ? ?  ? ?[place] ED from 10/22/2021 in [place] DEPT Counselor from 09/01/2021 in [facility] Admission (Discharged) from 08/07/2021 in [facility]  ?C-SSRS RISK CATEGORY Moderate Risk Moderate Risk High Risk  ? ?  ?  ? ?Chief Complaint:  ?Chief Complaint  ?Patient presents with  ? Psychiatric Evaluation  ? Hallucinating  ? ?Visit Diagnosis: F20.0 Schizophrenia ? ? ?CCA Screening, Triage and Referral (STR) ? ?Patient Reported Information ?How did you hear about [place]? Legal System ? ?What Is the Reason for Your Visit/Call  Today? Patient presented to the [facility] via IVC petitioned by his mother.  Patient is currently disorganized, unable to answer questions, has thought blocking, bizarre behavior and he is responding to internal stimuli.  He is unable to answer questions despite being alert.  Per patient's mother patient has been off meds for 2.5 months, roaming the streets and sleeping in the woods. He showed up to his job yesterday and they were happy to see him but when they started talking to him, he ran off. Mom and job were able to find him and keep him around until [employer] could arrive. Reports patient has not been sleeping or eating and has lost a lot of weight. Reports pt uses methamphetamine which cancels out his invega injections, also reports THC. Reports patient has a 2yo child,  but his schizophrenia sx started right before child was born and pt was engaged to the mom but she left and took the baby to [place]. He has not seen the child since. ? ? ?Provider Note [place], NP) dated 10/20/2021 ?HPI: Jerry Garrison is a 21 y.o. male who presents voluntarily to [facility] as a walk-in accompanied by his mother for increasing commands auditory hallucinations. Last night, patient heard the internal auditory commands to hide in the [place] trash can in other to hide from his enemies. Patient has psychiatric history of threatening other, Brief psychotic disorder, Schizophrenia and Schizo-affective schizophrenia. Patient lives at home with his mother and other siblings. Patient has several ED visits in the last 6 months and one admission to [facility] in 08/07/21 to 08/16/21. ?  ?Patient and mother reported that patient was prescribed  invega sustenna to be given every 28 days and last injection was on 09/01/21.  Mother stated that medication is very expensive, even with insurance she still has to pay $1,000 and cannot afford it at this time.  Reported that patient is seeing a therapist at [place]. This therapist inform  Umberto Egle (Mother) at [phone] that he would see patient unless he received his monthly Invega shot. Denied intentional self injurious behavior, SI/HI. Endorsed auditory hallucinations with commands. Endorsed prior suicidal attempt  from commands auditory hallucination. Slept for at least 5 hours last night. Support system form mother a siblings. Mother stated that patient father died from cancer when patient was 11 months old. Family history of mental illness include maternal aunt  and uncle diagnosed with Schizophrenia. Denied alcohol dependency and admitted to drug use of "ICE" when prompted by mother. Endorsed tobacco use of 2 cigarette per day, vaping and smoking one blunt of marijuana per day. Instruction provided on cigarette cessation.Denied history of trauma or abuse nor access to fire arms.   ?  ?On examination today, patient is sitting on a chair in the assessment room. Chart is reviewed and findings shared with the treatment team and discussed with Dr. Dwyane Dee. Patient is alert and oriented to person, place, time of day but not to situation. Speech is fluent, guarded with mother reiterating for patient to tell the truth. Mood and affect is anxious, depressed and worthless. Patient has poor judgement and insight. Thought process is coherent and linear. Thought content is ruminating.  Dr. Dwyane Dee made aware of patient's needs and approved of Invega 156 mg/mL IM sample to be administered IM to patient. Order written and medication IM administered to left deltoid and tolerated well. Community resources provided on medication assistance program. Patient and mother left [facility] 15 minutes after medication administration without any incident. ? ?How Long Has This Been Causing You Problems? 1 wk - 1 month ? ?What Do You Feel Would Help You the Most Today? Treatment for Depression or other mood problem; Alcohol or Drug Use Treatment ? ? ?Have You Recently Had Any Thoughts About Hurting Yourself? No ? ?Are You  Planning to Commit Suicide/Harm Yourself At This time? No ? ? ?Have you Recently Had Thoughts About [place]? No ? ?Are You Planning to Harm Someone at This Time? No ? ?Explanation: No data recorded ? ?Have You Used Any Alcohol or Drugs in the Past 24 Hours? Yes ? ?How Long Ago Did You Use Drugs or Alcohol? No data recorded ?What Did You Use and How Much? patient is positive for marijuana and methamphetamine ? ? ?Do You Currently Have a Therapist/Psychiatrist? Yes ? ?Name of Therapist/Psychiatrist: Burt Ek at the [facility] ? ? ?Have You Been Recently Discharged From Any Office Practice or Programs? No ? ?Explanation of Discharge From Practice/Program: Discharged from [facility] 01/12/2021 ? ? ?  ?CCA Screening Triage Referral Assessment ?Type of Contact: Tele-Assessment ? ?Telemedicine Service Delivery:   ?Is this Initial or Reassessment? Initial Assessment ? ?Date Telepsych consult ordered in [facility]:  08/06/21 ? ?Time Telepsych consult ordered in [facility]:  1259 ? ?Location of Assessment: [facility] ED ? ?Provider Location: [facility] Assessment Services ? ? ?Collateral Involvement: see mother's report in narrative ? ? ?Does Patient Have a Stage manager Guardian? No data recorded ?Name and Contact of Legal Guardian: No data recorded ?If Minor and Not Living with Parent(s), Who has Custody? NA ? ?Is CPS involved or ever been involved?  Never ? ?Is APS involved or ever been involved? Never ? ? ?Patient Determined To Be At Risk for Harm To Self or Others Based on Review of Patient Reported Information or Presenting Complaint? No ? ?Method: No data recorded ?Availability of Means: No data recorded ?Intent: No data recorded ?Notification Required: No data recorded ?Additional Information for Danger to Others Potential: No data recorded ?Additional Comments for Danger to Others Potential: No data recorded ?Are There Guns or Other Weapons in [place]? No data recorded ?Types of Guns/Weapons: No data recorded ?Are  These Weapons Safely Secured?                            No data recorded ?Who Could Verify You Are Able To Have These Secured: No data recorded ?Do You Have any Outstanding Charges, Pending Court Dates, Parole/

## 2021-10-23 NOTE — ED Notes (Signed)
Spoke to pt's mother and provided brief update. Mother reports that pt has been off meds for 2.5 months, roaming the streets and sleeping in the woods. He showed up to his job yesterday and they were happy to see him but when they started talking to him, he ran off. Mom and job were able to find him and keep him around until [employer] could arrive. Reports pt has not been sleeping or eating and has lost a lot of weight. Reports pt takes meth which cancels out his invega injections, also reports THC. Reports pt has a 2yo child but schizophrenia sx started right before child was born and pt was engaged to the mom but she left and took the baby to [place]. He has not seen the child since.  ?

## 2021-10-24 MED ORDER — PALIPERIDONE ER 6 MG PO TB24
6.0000 mg | ORAL_TABLET | Freq: Every day | ORAL | Status: DC
Start: 2021-10-24 — End: 2021-10-24

## 2021-10-24 NOTE — Progress Notes (Signed)
CSW communicated with Intake at [facility] and confirmed that pt can admit tomorrow and that pt's bed will be held until tomorrow. Nursing staff notified: Vivi Ferns. ? ?Benjaman Kindler, MSW, LCSWA ?10/24/2021 5:54 PM ? ? ?

## 2021-10-24 NOTE — ED Notes (Signed)
[facility] accepted patient. Patient can go after 8 AM to the [facility].  Estill Cotta is accepting MD. Nurse to Nurse report call [phone] ?

## 2021-10-24 NOTE — Progress Notes (Signed)
Inpatient Behavioral Health Placement ? ?Pt meets inpatient criteria per Doran Heater, FNP.  There are no available beds at [facility] per [facility] Fransico Michael, RN. Referral was sent to the following facilities;  ? ?Destination ?Service Provider Address Phone Fax  ?[facility]  [address] [place] [place] [address] [phone]  ?[facility]  [address], [place] [address] [phone]  ?[facility]  [address], [place] [address] [phone]  ?[facility]  [address] [place], [place] [address] [phone]  ?[facility] [place] [address] [phone]  ?[facility]  [address] [place], [place] [address] [phone]  ?[facility]  [address] [place] [address] [phone]  ?[facility]  [address] [place] [address] [phone]  ?[facility]  [address], [place] [address] [phone]  ?[facility]  [address] [place] [address] [phone]  ?[facility]  [address] [place] [address] [phone]  ?[facility]  [address] [place] [place] [address] [phone]  ?[facility]  [address], [place] [address] [phone]  ?[facility]  [address] [place], [place] [address] [phone]  ?[facility]  [address] [place], [place] [address] [phone]  ?[facility]  [address] [place] [address] [phone]  ? ?Discharge Information ? ? ?Situation  ongoing,  CSW will follow up. ? ? ?Maryjean Ka, MSW, LCSWA ?10/24/2021  @ 12:53 AM ? ?

## 2021-10-24 NOTE — Progress Notes (Signed)
10/24/2021  1744 Called Sheriff [phone] left a message with patient info and that patient needs to be transported to [facility] in the AM on 10/25/2021. ?

## 2021-10-24 NOTE — Progress Notes (Signed)
10/24/2021  [place] [phone] twice. No answer the phone rings until it turns into a busy tone. ?

## 2021-10-25 NOTE — ED Notes (Signed)
Took pt belonging bag to his room. Removed items in bag for pt to view. Pt confirms all items are present. Items include black wired ear buds, black apron, black sweat pants, tie dye shirt, black underwear, 1 pair socks, and black shoes. ?

## 2021-10-25 NOTE — ED Provider Notes (Signed)
Emergency Medicine Observation Re-evaluation Note ? ?Daking Westervelt is a 21 y.o. male, seen on rounds today.  Pt initially presented to the ED for complaints of Psychiatric Evaluation and Hallucinating ?Currently, the patient is resting. ? ?Physical Exam  ?BP (!) 104/54 (BP Location: Left Arm)   Pulse 62   Temp 98.6 ?F (37 ?C) (Oral)   Resp 18   Ht 1.778 m (5\' 10" )   Wt 63.5 kg   SpO2 99%   BMI 20.09 kg/m?  ?Physical Exam ?General: alert ?Cardiac: regular rate ?Lungs: breathing easily ?Psych: calm ? ?ED Course / MDM  ?EKG:  ? ?I have reviewed the labs performed to date as well as medications administered while in observation.  Recent changes in the last 24 hours include no acute changes. ? ?Plan  ?Current plan is for transfer to [facility] psych. ?Lion Fernandez is under involuntary commitment. ?  ? ?  ?[name], MD ?10/25/21 [phone] ? ?

## 2021-11-11 ENCOUNTER — Ambulatory Visit (HOSPITAL_COMMUNITY): Payer: BLUE CROSS/BLUE SHIELD | Admitting: Licensed Clinical Social Worker

## 2021-11-25 ENCOUNTER — Ambulatory Visit (HOSPITAL_COMMUNITY): Payer: BLUE CROSS/BLUE SHIELD

## 2022-01-24 ENCOUNTER — Emergency Department (HOSPITAL_COMMUNITY): Payer: BLUE CROSS/BLUE SHIELD

## 2022-01-24 ENCOUNTER — Inpatient Hospital Stay (HOSPITAL_COMMUNITY)
Admission: EM | Admit: 2022-01-24 | Discharge: 2022-01-26 | DRG: 871 | Disposition: A | Discharge status: Other Acute Inpatient Hospital | Payer: BLUE CROSS/BLUE SHIELD | Attending: Internal Medicine | Admitting: Internal Medicine

## 2022-01-24 ENCOUNTER — Other Ambulatory Visit: Payer: Self-pay

## 2022-01-24 ENCOUNTER — Encounter (HOSPITAL_COMMUNITY): Payer: Self-pay

## 2022-01-24 DIAGNOSIS — Z91040 Latex allergy status: Secondary | ICD-10-CM

## 2022-01-24 DIAGNOSIS — R0902 Hypoxemia: Secondary | ICD-10-CM

## 2022-01-24 DIAGNOSIS — F259 Schizoaffective disorder, unspecified: Secondary | ICD-10-CM | POA: Diagnosis present

## 2022-01-24 DIAGNOSIS — J690 Pneumonitis due to inhalation of food and vomit: Secondary | ICD-10-CM | POA: Diagnosis not present

## 2022-01-24 DIAGNOSIS — A419 Sepsis, unspecified organism: Principal | ICD-10-CM | POA: Diagnosis present

## 2022-01-24 DIAGNOSIS — T50901A Poisoning by unspecified drugs, medicaments and biological substances, accidental (unintentional), initial encounter: Secondary | ICD-10-CM

## 2022-01-24 DIAGNOSIS — F1510 Other stimulant abuse, uncomplicated: Secondary | ICD-10-CM | POA: Diagnosis present

## 2022-01-24 DIAGNOSIS — Z79899 Other long term (current) drug therapy: Secondary | ICD-10-CM

## 2022-01-24 DIAGNOSIS — F209 Schizophrenia, unspecified: Secondary | ICD-10-CM

## 2022-01-24 DIAGNOSIS — T40601A Poisoning by unspecified narcotics, accidental (unintentional), initial encounter: Principal | ICD-10-CM

## 2022-01-24 DIAGNOSIS — Z20822 Contact with and (suspected) exposure to covid-19: Secondary | ICD-10-CM | POA: Diagnosis present

## 2022-01-24 DIAGNOSIS — R7401 Elevation of levels of liver transaminase levels: Secondary | ICD-10-CM | POA: Diagnosis present

## 2022-01-24 DIAGNOSIS — F1210 Cannabis abuse, uncomplicated: Secondary | ICD-10-CM | POA: Diagnosis present

## 2022-01-24 DIAGNOSIS — J9601 Acute respiratory failure with hypoxia: Secondary | ICD-10-CM | POA: Diagnosis present

## 2022-01-24 DIAGNOSIS — F32A Depression, unspecified: Secondary | ICD-10-CM | POA: Diagnosis present

## 2022-01-24 DIAGNOSIS — T402X1A Poisoning by other opioids, accidental (unintentional), initial encounter: Secondary | ICD-10-CM | POA: Diagnosis present

## 2022-01-24 HISTORY — DX: Other psychoactive substance abuse, uncomplicated: F19.10

## 2022-01-24 LAB — CBC WITH DIFFERENTIAL/PLATELET
Abs Immature Granulocytes: 0.05 10*3/uL (ref 0.00–0.07)
Basophils Absolute: 0 10*3/uL (ref 0.0–0.1)
Basophils Relative: 0 %
Eosinophils Absolute: 0 10*3/uL (ref 0.0–0.5)
Eosinophils Relative: 0 %
HCT: 48.6 % (ref 39.0–52.0)
Hemoglobin: 16.3 g/dL (ref 13.0–17.0)
Immature Granulocytes: 0 %
Lymphocytes Relative: 8 %
Lymphs Abs: 1 10*3/uL (ref 0.7–4.0)
MCH: 29.3 pg (ref 26.0–34.0)
MCHC: 33.5 g/dL (ref 30.0–36.0)
MCV: 87.4 fL (ref 80.0–100.0)
Monocytes Absolute: 0.8 10*3/uL (ref 0.1–1.0)
Monocytes Relative: 7 %
Neutro Abs: 10.8 10*3/uL — ABNORMAL HIGH (ref 1.7–7.7)
Neutrophils Relative %: 85 %
Platelets: 225 10*3/uL (ref 150–400)
RBC: 5.56 MIL/uL (ref 4.22–5.81)
RDW: 12 % (ref 11.5–15.5)
WBC: 12.6 10*3/uL — ABNORMAL HIGH (ref 4.0–10.5)
nRBC: 0 % (ref 0.0–0.2)

## 2022-01-24 LAB — COMPREHENSIVE METABOLIC PANEL
ALT: 82 U/L — ABNORMAL HIGH (ref 0–44)
AST: 121 U/L — ABNORMAL HIGH (ref 15–41)
Albumin: 4.3 g/dL (ref 3.5–5.0)
Alkaline Phosphatase: 63 U/L (ref 38–126)
Anion gap: 11 (ref 5–15)
BUN: 18 mg/dL (ref 6–20)
CO2: 24 mmol/L (ref 22–32)
Calcium: 8.6 mg/dL — ABNORMAL LOW (ref 8.9–10.3)
Chloride: 102 mmol/L (ref 98–111)
Creatinine, Ser: 1.35 mg/dL — ABNORMAL HIGH (ref 0.61–1.24)
GFR, Estimated: >60 mL/min (ref >60)
Glucose, Bld: 104 mg/dL — ABNORMAL HIGH (ref 70–99)
Potassium: 4 mmol/L (ref 3.5–5.1)
Sodium: 137 mmol/L (ref 135–145)
Total Bilirubin: 1.8 mg/dL — ABNORMAL HIGH (ref 0.3–1.2)
Total Protein: 7.8 g/dL (ref 6.5–8.1)

## 2022-01-24 LAB — ETHANOL: Alcohol, Ethyl (B): <10 mg/dL (ref <10)

## 2022-01-24 LAB — ACETAMINOPHEN LEVEL: Acetaminophen (Tylenol), Serum: <10 ug/mL — ABNORMAL LOW (ref 10–30)

## 2022-01-24 LAB — CBG MONITORING, ED: Glucose-Capillary: 113 mg/dL — ABNORMAL HIGH (ref 70–99)

## 2022-01-24 LAB — SALICYLATE LEVEL: Salicylate Lvl: <7 mg/dL — ABNORMAL LOW (ref 7.0–30.0)

## 2022-01-24 MED ORDER — SODIUM CHLORIDE 0.9 % IV SOLN
3.0000 g | Freq: Once | INTRAVENOUS | Status: AC
  Administered 2022-01-24: 3 g via INTRAVENOUS
  Filled 2022-01-24: qty 8

## 2022-01-24 MED ORDER — SODIUM CHLORIDE 0.9 % IV SOLN
INTRAVENOUS | Status: DC
  Administered 2022-01-25: 125 mL via INTRAVENOUS

## 2022-01-24 MED ORDER — SODIUM CHLORIDE 0.9 % IV BOLUS
1000.0000 mL | Freq: Once | INTRAVENOUS | Status: AC
  Administered 2022-01-24: 1000 mL via INTRAVENOUS

## 2022-01-24 NOTE — ED Provider Notes (Signed)
[facility] Provider Note   CSN: [id number] Arrival date & time: 01/24/22  2051     History  Chief Complaint  Patient presents with   Drug Overdose    Jerry Garrison is a 21 y.o. male.  HPI 21 year old male history of schizophrenia presents today via EMS with report of overdose.  Patient states that he snorted oxycodone.  EMS reports that he was found outside behaving bizarrely, urinated on self, falls asleep and sats dropped to 85%.  Patient is not currently complaining of any suicidal ideation but did say that he had been upset because his girlfriend had left    Home Medications Prior to Admission medications   Medication Sig Start Date End Date Taking? Authorizing Provider  paliperidone (INVEGA SUSTENNA) 156 MG/ML SUSY injection Inject 1 mL (156 mg total) into the muscle every 28 (twenty-eight) days. 09/01/21   Shanna Cisco, NP  traZODone (DESYREL) 50 MG tablet Take 1 tablet (50 mg total) by mouth at bedtime. Patient not taking: Reported on 10/23/2021 09/01/21   Shanna Cisco, NP      Allergies    Latex    Review of Systems   Review of Systems  Physical Exam Updated Vital Signs BP (!) 141/88   Pulse 94   Temp 99.5 F (37.5 C) (Oral)   Resp (!) 22   Ht 1.778 m (5\' 10" )   Wt 63.5 kg   SpO2 92%   BMI 20.09 kg/m  Physical Exam Vitals and nursing note reviewed.  Constitutional:      General: He is not in acute distress.    Appearance: Normal appearance.  HENT:     Head: Normocephalic and atraumatic.     Right Ear: External ear normal.     Left Ear: External ear normal.     Nose: Nose normal.     Mouth/Throat:     Pharynx: Oropharynx is clear.  Eyes:     Extraocular Movements: Extraocular movements intact.     Pupils: Pupils are equal, round, and reactive to light.  Cardiovascular:     Rate and Rhythm: Normal rate and regular rhythm.     Pulses: Normal pulses.  Pulmonary:     Effort: Pulmonary effort is normal.      Breath sounds: Normal breath sounds.     Comments: Patient awake and talking in room and seems to be breathing normally but reportedly has had some respiratory depression Abdominal:     General: Abdomen is flat.     Palpations: Abdomen is soft.  Musculoskeletal:        General: Normal range of motion.     Cervical back: Normal range of motion.  Skin:    General: Skin is warm and dry.     Capillary Refill: Capillary refill takes less than 2 seconds.  Neurological:     General: No focal deficit present.     Mental Status: He is alert and oriented to person, place, and time.  Psychiatric:        Attention and Perception: He is inattentive.        Mood and Affect: Mood is depressed.        Speech: Speech normal.        Behavior: Behavior is cooperative.        Thought Content: Thought content is delusional.        Cognition and Memory: Cognition normal.     Comments: Patient reports that most days he thinks he  is God     ED Results / Procedures / Treatments   Labs (all labs ordered are listed, but only abnormal results are displayed) Labs Reviewed  COMPREHENSIVE METABOLIC PANEL - Abnormal; Notable for the following components:      Result Value   Glucose, Bld 104 (*)    Creatinine, Ser 1.35 (*)    Calcium 8.6 (*)    AST 121 (*)    ALT 82 (*)    Total Bilirubin 1.8 (*)    All other components within normal limits  CBC WITH DIFFERENTIAL/PLATELET - Abnormal; Notable for the following components:   WBC 12.6 (*)    Neutro Abs 10.8 (*)    All other components within normal limits  CBG MONITORING, ED - Abnormal; Notable for the following components:   Glucose-Capillary 113 (*)    All other components within normal limits  SALICYLATE LEVEL  ACETAMINOPHEN LEVEL  ETHANOL  RAPID URINE DRUG SCREEN, HOSP PERFORMED    EKG EKG Interpretation  Date/Time:  Saturday January 24 2022 21:11:05 EDT Ventricular Rate:  104 PR Interval:  155 QRS Duration: 97 QT  Interval:  337 QTC Calculation: 444 R Axis:   75 Text Interpretation: Sinus tachycardia RSR' in V1 or V2, right VCD or RVH Probable left ventricular hypertrophy ST elev, probable normal early repol pattern Confirmed by Margarita Grizzle [phone]) on 01/24/2022 10:33:21 PM  Radiology No results found.  Procedures Procedures    Medications Ordered in ED Medications  sodium chloride 0.9 % bolus 1,000 mL (0 mLs Intravenous Stopped 01/24/22 2232)    And  0.9 %  sodium chloride infusion ( Intravenous New Bag/Given 01/24/22 2127)    ED Course/ Medical Decision Making/ A&P                           Medical Decision Making 21 year old male who snorted oxycodone.  Presentation is consistent with  an acute narcotic overdose.  Denies other ingestions. Does not report intending self-harm History of schizophrenia 10:38 PM Patient on 2 L nasal cannula sats 89 to 90%. He appears to be awake and breathing well.  Will obtain chest x-Aubri Gathright 1 chronic overdose 2 low sat on Care discussed with Dr. Read Drivers and he will reevaluate when labs and imaging have been completed  Amount and/or Complexity of Data Reviewed Labs: ordered. Decision-making details documented in ED Course.  Risk Prescription drug management.           Final Clinical Impression(s) / ED Diagnoses Final diagnoses:  Narcotic overdose, accidental or unintentional, initial encounter (HCC)  Schizophrenia, unspecified type [facility])    Rx / DC Orders ED Discharge Orders     None         Margarita Grizzle, MD 01/24/22 2242

## 2022-01-24 NOTE — ED Triage Notes (Signed)
BIB EMS from outside- pt reported to have taken 1 Roxicodone. Pt urinated on self, altered, O2 sats drop to 85% when pt falls asleep.

## 2022-01-25 ENCOUNTER — Observation Stay (HOSPITAL_COMMUNITY): Payer: BLUE CROSS/BLUE SHIELD

## 2022-01-25 ENCOUNTER — Encounter (HOSPITAL_COMMUNITY): Payer: Self-pay | Admitting: Internal Medicine

## 2022-01-25 DIAGNOSIS — R0902 Hypoxemia: Secondary | ICD-10-CM

## 2022-01-25 DIAGNOSIS — T40601A Poisoning by unspecified narcotics, accidental (unintentional), initial encounter: Secondary | ICD-10-CM | POA: Diagnosis not present

## 2022-01-25 DIAGNOSIS — A419 Sepsis, unspecified organism: Secondary | ICD-10-CM | POA: Diagnosis present

## 2022-01-25 DIAGNOSIS — Z91040 Latex allergy status: Secondary | ICD-10-CM | POA: Diagnosis not present

## 2022-01-25 DIAGNOSIS — J9601 Acute respiratory failure with hypoxia: Secondary | ICD-10-CM

## 2022-01-25 DIAGNOSIS — J690 Pneumonitis due to inhalation of food and vomit: Secondary | ICD-10-CM

## 2022-01-25 DIAGNOSIS — T50901A Poisoning by unspecified drugs, medicaments and biological substances, accidental (unintentional), initial encounter: Secondary | ICD-10-CM | POA: Diagnosis not present

## 2022-01-25 DIAGNOSIS — T402X1A Poisoning by other opioids, accidental (unintentional), initial encounter: Secondary | ICD-10-CM | POA: Diagnosis present

## 2022-01-25 DIAGNOSIS — F1210 Cannabis abuse, uncomplicated: Secondary | ICD-10-CM | POA: Diagnosis present

## 2022-01-25 DIAGNOSIS — Z20822 Contact with and (suspected) exposure to covid-19: Secondary | ICD-10-CM | POA: Diagnosis present

## 2022-01-25 DIAGNOSIS — F259 Schizoaffective disorder, unspecified: Secondary | ICD-10-CM

## 2022-01-25 DIAGNOSIS — F1510 Other stimulant abuse, uncomplicated: Secondary | ICD-10-CM | POA: Diagnosis present

## 2022-01-25 DIAGNOSIS — R7401 Elevation of levels of liver transaminase levels: Secondary | ICD-10-CM | POA: Diagnosis present

## 2022-01-25 DIAGNOSIS — Z79899 Other long term (current) drug therapy: Secondary | ICD-10-CM | POA: Diagnosis not present

## 2022-01-25 DIAGNOSIS — F32A Depression, unspecified: Secondary | ICD-10-CM | POA: Diagnosis present

## 2022-01-25 LAB — HEPATITIS PANEL, ACUTE
HCV Ab: NONREACTIVE
Hep A IgM: NONREACTIVE
Hep B C IgM: NONREACTIVE
Hepatitis B Surface Ag: NONREACTIVE

## 2022-01-25 LAB — COMPREHENSIVE METABOLIC PANEL
ALT: 72 U/L — ABNORMAL HIGH (ref 0–44)
AST: 127 U/L — ABNORMAL HIGH (ref 15–41)
Albumin: 3.4 g/dL — ABNORMAL LOW (ref 3.5–5.0)
Alkaline Phosphatase: 55 U/L (ref 38–126)
Anion gap: 11 (ref 5–15)
BUN: 16 mg/dL (ref 6–20)
CO2: 24 mmol/L (ref 22–32)
Calcium: 8.5 mg/dL — ABNORMAL LOW (ref 8.9–10.3)
Chloride: 105 mmol/L (ref 98–111)
Creatinine, Ser: 1.06 mg/dL (ref 0.61–1.24)
GFR, Estimated: >60 mL/min (ref >60)
Glucose, Bld: 111 mg/dL — ABNORMAL HIGH (ref 70–99)
Potassium: 3.9 mmol/L (ref 3.5–5.1)
Sodium: 140 mmol/L (ref 135–145)
Total Bilirubin: 2.1 mg/dL — ABNORMAL HIGH (ref 0.3–1.2)
Total Protein: 6.5 g/dL (ref 6.5–8.1)

## 2022-01-25 LAB — CBC
HCT: 45.3 % (ref 39.0–52.0)
Hemoglobin: 15 g/dL (ref 13.0–17.0)
MCH: 29.1 pg (ref 26.0–34.0)
MCHC: 33.1 g/dL (ref 30.0–36.0)
MCV: 87.8 fL (ref 80.0–100.0)
Platelets: 194 10*3/uL (ref 150–400)
RBC: 5.16 MIL/uL (ref 4.22–5.81)
RDW: 11.9 % (ref 11.5–15.5)
WBC: 13.9 10*3/uL — ABNORMAL HIGH (ref 4.0–10.5)
nRBC: 0 % (ref 0.0–0.2)

## 2022-01-25 LAB — RAPID URINE DRUG SCREEN, HOSP PERFORMED
Amphetamines: NOT DETECTED
Barbiturates: NOT DETECTED
Benzodiazepines: NOT DETECTED
Cocaine: NOT DETECTED
Opiates: NOT DETECTED
Tetrahydrocannabinol: POSITIVE — AB

## 2022-01-25 LAB — HIV ANTIBODY (ROUTINE TESTING W REFLEX): HIV Screen 4th Generation wRfx: NONREACTIVE

## 2022-01-25 MED ORDER — ACETAMINOPHEN 325 MG PO TABS: 650.0000 mg | ORAL_TABLET | Freq: Four times a day (QID) | ORAL | Status: DC | PRN

## 2022-01-25 MED ORDER — ACETAMINOPHEN 500 MG PO TABS
1000.0000 mg | ORAL_TABLET | Freq: Once | ORAL | Status: AC
  Administered 2022-01-25: 1000 mg via ORAL
  Filled 2022-01-25: qty 2

## 2022-01-25 MED ORDER — SODIUM CHLORIDE 0.9 % IV SOLN
3.0000 g | Freq: Four times a day (QID) | INTRAVENOUS | Status: DC
  Administered 2022-01-25 – 2022-01-26 (×5): 3 g via INTRAVENOUS
  Filled 2022-01-25 (×5): qty 8

## 2022-01-25 MED ORDER — ACETAMINOPHEN 500 MG PO TABS: 1000.0000 mg | ORAL_TABLET | Freq: Once | ORAL | Status: DC

## 2022-01-25 MED ORDER — ENOXAPARIN SODIUM 40 MG/0.4ML IJ SOSY
40.0000 mg | PREFILLED_SYRINGE | INTRAMUSCULAR | Status: DC
  Administered 2022-01-26: 40 mg via SUBCUTANEOUS
  Filled 2022-01-25: qty 0.4

## 2022-01-25 NOTE — H&P (Addendum)
History and Physical    Patient: Jerry Garrison JJK:[id number] DOB: [birth year] DOA: 01/24/2022 DOS: the patient was seen and examined on 01/25/2022 PCP: Pcp, No  Patient coming from: Home  Chief Complaint:  Chief Complaint  Patient presents with   Drug Overdose   HPI: Jerry Garrison is a 21 y.o. male with medical history significant of Schizophrenia.  Polysubstance abuse including amphetamines, THC, now opiates apparently.  Pt snorted oxycodone today.  Denies SI though does say he is depressed because his girlfriend left him.  Also believes he is "God".  Pt ODd on oxycodone.  Didn't get narcan.  Apparently had vomiting and aspiration episode.    Review of Systems: As mentioned in the history of present illness. All other systems reviewed and are negative. Past Medical History:  Diagnosis Date   Schizophrenia [facility])    History reviewed. No pertinent surgical history. Social History:  reports that he has never smoked. He has never used smokeless tobacco. He reports current drug use. Drug: Marijuana. He reports that he does not drink alcohol.  Allergies  Allergen Reactions   Latex Itching    History reviewed. No pertinent family history.  Prior to Admission medications   Medication Sig Start Date End Date Taking? Authorizing Provider  paliperidone (INVEGA SUSTENNA) 156 MG/ML SUSY injection Inject 1 mL (156 mg total) into the muscle every 28 (twenty-eight) days. 09/01/21   Shanna Cisco, NP  traZODone (DESYREL) 50 MG tablet Take 1 tablet (50 mg total) by mouth at bedtime. Patient not taking: Reported on 10/23/2021 09/01/21   Shanna Cisco, NP    Physical Exam: Vitals:   01/24/22 2300 01/24/22 2330 01/25/22 0000 01/25/22 0015  BP: (!) 142/94 139/77 131/88   Pulse: (!) 113 (!) 106 (!) 106 (!) 107  Resp: 15 (!) 21 (!) 25 17  Temp:      TempSrc:      SpO2: 96% 91% 93% 94%  Weight:      Height:       Constitutional: NAD, calm, comfortable Eyes: PERRL, lids and  conjunctivae normal ENMT: Mucous membranes are moist. Posterior pharynx clear of any exudate or lesions.Normal dentition.  Neck: normal, supple, no masses, no thyromegaly Respiratory: Few crackles on R side Cardiovascular: Tachycardic Abdomen: no tenderness, no masses palpated. No hepatosplenomegaly. Bowel sounds positive.  Musculoskeletal: no clubbing / cyanosis. No joint deformity upper and lower extremities. Good ROM, no contractures. Normal muscle tone.  Skin: no rashes, lesions, ulcers. No induration Neurologic: CN 2-12 grossly intact. Sensation intact, DTR normal. Strength 5/5 in all 4.  Psychiatric: Pt reports that most days he thinks he is God.  Data Reviewed:    CBC    Component Value Date/Time   WBC 12.6 (H) 01/24/2022 2110   RBC 5.56 01/24/2022 2110   HGB 16.3 01/24/2022 2110   HCT 48.6 01/24/2022 2110   PLT 225 01/24/2022 2110   MCV 87.4 01/24/2022 2110   MCH 29.3 01/24/2022 2110   MCHC 33.5 01/24/2022 2110   RDW 12.0 01/24/2022 2110   LYMPHSABS 1.0 01/24/2022 2110   MONOABS 0.8 01/24/2022 2110   EOSABS 0.0 01/24/2022 2110   BASOSABS 0.0 01/24/2022 2110   CMP     Component Value Date/Time   NA 137 01/24/2022 2110   K 4.0 01/24/2022 2110   CL 102 01/24/2022 2110   CO2 24 01/24/2022 2110   GLUCOSE 104 (H) 01/24/2022 2110   BUN 18 01/24/2022 2110   CREATININE 1.35 (H) 01/24/2022 2110   CALCIUM  8.6 (L) 01/24/2022 2110   PROT 7.8 01/24/2022 2110   ALBUMIN 4.3 01/24/2022 2110   AST 121 (H) 01/24/2022 2110   ALT 82 (H) 01/24/2022 2110   ALKPHOS 63 01/24/2022 2110   BILITOT 1.8 (H) 01/24/2022 2110   GFRNONAA >60 01/24/2022 2110   GFRAA NOT CALCULATED 04/17/2016 2335   CXR showing RUL and RLL PNA findings.  Assessment and Plan: * Aspiration pneumonia (HCC) Vomited and aspirated during OD of Oxycodone Now has Aspiration PNA vs pneumonitis causing new O2 requirement. PNA pathway Empiric unasyn O2 via [place] Cont pulse ox Tele monitor  Overdose of opiate or  related narcotic, accidental or unintentional, initial encounter (HCC) Due to snorting oxycodone. Didn't receive Narcan No need at present time for Narcan But did vomit and aspirate.  Transaminitis New onset transaminitis 1) check hepatitis pnl 2) tylenol level is negative (so doesn't seem to be tylenol OD). 3) repeat CMP in AM  Acute respiratory failure with hypoxia (HCC) Patient has acute respiratory failure with hypoxia due to having a new oxygen requirement.  That is the patient has a PaO2 < 60 (pulse Ox < 90%) on room air. New O2 requirement. Secondary to aspiration PNA today.  Schizo affective schizophrenia [facility]) Psych consult after patient more alert / improved clinically depending on how hes doing at that time. Denies SI with the OD today. Admits to depression because his girlfriend left him recently. Currently reports that "most days I am God".      Advance Care Planning:   Code Status: Full Code  Consults: None  Family Communication: No family in room  Severity of Illness: The appropriate patient status for this patient is OBSERVATION. Observation status is judged to be reasonable and necessary in order to provide the required intensity of service to ensure the patient's safety. The patient's presenting symptoms, physical exam findings, and initial radiographic and laboratory data in the context of their medical condition is felt to place them at decreased risk for further clinical deterioration. Furthermore, it is anticipated that the patient will be medically stable for discharge from the hospital within 2 midnights of admission.   Author: Hillary Bow., DO 01/25/2022 12:30 AM  For on call review [email or web address].

## 2022-01-25 NOTE — Assessment & Plan Note (Addendum)
Due to snorting oxycodone. 1. Didn't receive Narcan 1. No need at present time for Narcan 2. But did vomit and aspirate.

## 2022-01-25 NOTE — ED Notes (Signed)
Patient showered, gown changed and linens changed.

## 2022-01-25 NOTE — Assessment & Plan Note (Addendum)
New onset transaminitis 1) check hepatitis pnl 2) tylenol level is negative (so doesn't seem to be tylenol OD). 3) repeat CMP in AM

## 2022-01-25 NOTE — Assessment & Plan Note (Signed)
Vomited and aspirated during OD of Oxycodone Now has Aspiration PNA vs pneumonitis causing new O2 requirement. 1. PNA pathway 2. Empiric unasyn 3. O2 via [place] 4. Cont pulse ox 5. Tele monitor

## 2022-01-25 NOTE — Progress Notes (Signed)
[facility] PROGRESS NOTE   Jerry Garrison ZOX:[id number] DOB: 09-27-2000 DOA: 01/24/2022  PCP: Pcp, No  Brief History/Interval Summary: 21 y.o. male with medical history significant of Schizophrenia, polysubstance abuse including amphetamines, THC, now opiates apparently.  Patient's noted oxycodone, became obtunded started vomiting he had an aspiration episode.  Noted to have aspiration pneumonia.  Requiring oxygen.  Hospitalized for further management.  Consultants: We will consult psychiatry tomorrow  Procedures: None    Subjective/Interval History: Patient is somewhat somnolent.  Arousable but not very communicative.  Does not appear to be in any discomfort.  Denies any chest pain.  No shortness of breath.  Occasional cough.  No nausea.  Denies any abdominal pain.     Assessment/Plan:  Aspiration pneumonia/sepsis present on admission/acute respiratory failure with hypoxia Patient had sepsis at the time of admission with fever tachycardia and leukocytosis. Chest x-ray suggested aspiration pneumonia.  Likely because of vomiting episode after he started oxycodone. Continue with Unasyn.  WBC remains elevated.  Continues to require oxygen.  Hopefully will be able to wean it off in the next 24 to 48 hours.  Opioid overdose Patient snorted oxycodone.  Did not require Narcan.  Somnolent but easily arousable.  Continue to monitor.  Transaminitis Follow-up on hepatitis panel and HIV.  Tylenol level was negative.  LFTs noted to be stable today.  Proceed with right upper quadrant ultrasound.  Schizoaffective/schizophrenia Denies any suicidal ideation.  However has been depressed.  Apparently his girlfriend left him recently.  Considering his underlying psychiatric illness he may benefit from psychiatry evaluation.  We will wait till tomorrow for him to be more awake and alert.  No need for any sitter at this time.   DVT Prophylaxis: Lovenox Code Status: Full code Family  Communication: Discussed with patient Disposition Plan: Anticipate return home when improved  Status is: Observation The patient will require care spanning > 2 midnights and should be moved to inpatient because: Aspiration pneumonia, oxygen requirement.      Medications: Scheduled:  acetaminophen  1,000 mg Oral Once   [START ON 01/26/2022] enoxaparin (LOVENOX) injection  40 mg Subcutaneous Q24H   paliperidone  156 mg Intramuscular Q28 days   Continuous:  sodium chloride 125 mL/hr at 01/25/22 0823   ampicillin-sulbactam (UNASYN) IV Stopped (01/25/22 0555)   WJX:[id number]  Antibiotics: Anti-infectives (From admission, onward)    Start     Dose/Rate Route Frequency Ordered Stop   01/25/22 0600  Ampicillin-Sulbactam (UNASYN) 3 g in sodium chloride 0.9 % 100 mL IVPB        3 g 200 mL/hr over 30 Minutes Intravenous Every 6 hours 01/25/22 0027     01/24/22 2330  Ampicillin-Sulbactam (UNASYN) 3 g in sodium chloride 0.9 % 100 mL IVPB        3 g 200 mL/hr over 30 Minutes Intravenous  Once 01/24/22 2324 01/25/22 0015       Objective:  Vital Signs  Vitals:   01/25/22 0700 01/25/22 0730 01/25/22 0800 01/25/22 0825  BP: 117/86     Pulse: 81  80 82  Resp: 17  18   Temp:  98.8 F (37.1 C)    TempSrc:      SpO2: 100%  94% 96%  Weight:      Height:        Intake/Output Summary (Last 24 hours) at 01/25/2022 0846 Last data filed at 01/25/2022 0015 Gross per 24 hour  Intake 1397.87 ml  Output --  Net 1397.87 ml   Ceasar Mons  Weights   01/24/22 2107  Weight: 63.5 kg    General appearance: Awake alert.  In no distress Resp: Mildly tachypneic.  No use of accessory muscles.  Crackles in the bases.  No wheezing or rhonchi. Cardio: S1-S2 is normal regular.  No S3-S4.  No rubs murmurs or bruit GI: Abdomen is soft.  Nontender nondistended.  Bowel sounds are present normal.  No masses organomegaly Extremities: No edema.   Neurologic:  No focal neurological deficits.    Lab  Results:  Data Reviewed: I have personally reviewed following labs and reports of the imaging studies  CBC: Recent Labs  Lab 01/24/22 2110 01/25/22 0501  WBC 12.6* 13.9*  NEUTROABS 10.8*  --   HGB 16.3 15.0  HCT 48.6 45.3  MCV 87.4 87.8  PLT 225 194    Basic Metabolic Panel: Recent Labs  Lab 01/24/22 2110 01/25/22 0501  NA 137 140  K 4.0 3.9  CL 102 105  CO2 24 24  GLUCOSE 104* 111*  BUN 18 16  CREATININE 1.35* 1.06  CALCIUM 8.6* 8.5*    GFR: Estimated Creatinine Clearance: 98.2 mL/min (by C-G formula based on SCr of 1.06 mg/dL).  Liver Function Tests: Recent Labs  Lab 01/24/22 2110 01/25/22 0501  AST 121* 127*  ALT 82* 72*  ALKPHOS 63 55  BILITOT 1.8* 2.1*  PROT 7.8 6.5  ALBUMIN 4.3 3.4*     CBG: Recent Labs  Lab 01/24/22 2115  GLUCAP 113*    Radiology Studies: [facility] Chest Port 1 View  Result Date: 01/24/2022 CLINICAL DATA:  Low sats.  AMS reports of overdose. EXAM: PORTABLE CHEST 1 VIEW COMPARISON:  None Available. FINDINGS: The heart size and mediastinal contours are within normal limits. Right upper and lower lobe hazy opacities concerning for pneumonia. No appreciable pleural effusion. No pneumothorax. Osseous structures are unremarkable. IMPRESSION: Right upper and lower lobe opacities concerning for pneumonia. Follow-up examination to resolution is recommended. Electronically Signed   By: Larose Hires D.O.   On: 01/24/2022 23:02       LOS: 0 days   Finlee Concepcion Rito Ehrlich  [facility] Pager on [email or web address]  01/25/2022, 8:46 AM

## 2022-01-25 NOTE — Progress Notes (Signed)
Pharmacy Antibiotic Note  Dev Royster is a 21 y.o. male admitted on 01/24/2022 with aspiration pneumonia.  Pharmacy has been consulted for Unasyn dosing.  Plan: Unasyn 3gm IV q6h No dose adjustments anticipated.  Pharmacy will sign off and monitor peripherally via electronic surveillance software for any changes in renal function or micro data.   Height: 5\' 10"  (177.8 cm) Weight: 63.5 kg (139 lb 15.9 oz) IBW/kg (Calculated) : 73  Temp (24hrs), Avg:99.5 F (37.5 C), Min:99.5 F (37.5 C), Max:99.5 F (37.5 C)  Recent Labs  Lab 01/24/22 2110  WBC 12.6*  CREATININE 1.35*    Estimated Creatinine Clearance: 77.1 mL/min (A) (by C-G formula based on SCr of 1.35 mg/dL (H)).    Allergies  Allergen Reactions   Latex Itching    Thank you for allowing pharmacy to be a part of this patient's care.  2111 PharmD 01/25/2022 12:28 AM

## 2022-01-25 NOTE — ED Notes (Signed)
PT awake, alert and ambulatory. Pt requested a shower. One provided

## 2022-01-25 NOTE — Assessment & Plan Note (Signed)
Psych consult after patient more alert / improved clinically depending on how hes doing at that time. Denies SI with the OD today. Admits to depression because his girlfriend left him recently. Currently reports that "most days I am God".

## 2022-01-25 NOTE — Assessment & Plan Note (Signed)
Patient has acute respiratory failure with hypoxia due to having a new oxygen requirement.  That is the patient has a PaO2 < 60 (pulse Ox < 90%) on room air. New O2 requirement. Secondary to aspiration PNA today.

## 2022-01-26 ENCOUNTER — Other Ambulatory Visit (HOSPITAL_COMMUNITY): Payer: Self-pay

## 2022-01-26 DIAGNOSIS — T50901A Poisoning by unspecified drugs, medicaments and biological substances, accidental (unintentional), initial encounter: Secondary | ICD-10-CM

## 2022-01-26 DIAGNOSIS — J690 Pneumonitis due to inhalation of food and vomit: Secondary | ICD-10-CM | POA: Diagnosis not present

## 2022-01-26 LAB — CBC
HCT: 42.8 % (ref 39.0–52.0)
Hemoglobin: 14 g/dL (ref 13.0–17.0)
MCH: 28.8 pg (ref 26.0–34.0)
MCHC: 32.7 g/dL (ref 30.0–36.0)
MCV: 88.1 fL (ref 80.0–100.0)
Platelets: 197 10*3/uL (ref 150–400)
RBC: 4.86 MIL/uL (ref 4.22–5.81)
RDW: 11.8 % (ref 11.5–15.5)
WBC: 12.1 10*3/uL — ABNORMAL HIGH (ref 4.0–10.5)
nRBC: 0 % (ref 0.0–0.2)

## 2022-01-26 LAB — COMPREHENSIVE METABOLIC PANEL
ALT: 58 U/L — ABNORMAL HIGH (ref 0–44)
AST: 101 U/L — ABNORMAL HIGH (ref 15–41)
Albumin: 3 g/dL — ABNORMAL LOW (ref 3.5–5.0)
Alkaline Phosphatase: 49 U/L (ref 38–126)
Anion gap: 5 (ref 5–15)
BUN: 9 mg/dL (ref 6–20)
CO2: 24 mmol/L (ref 22–32)
Calcium: 8.3 mg/dL — ABNORMAL LOW (ref 8.9–10.3)
Chloride: 109 mmol/L (ref 98–111)
Creatinine, Ser: 0.99 mg/dL (ref 0.61–1.24)
GFR, Estimated: >60 mL/min (ref >60)
Glucose, Bld: 105 mg/dL — ABNORMAL HIGH (ref 70–99)
Potassium: 3.7 mmol/L (ref 3.5–5.1)
Sodium: 138 mmol/L (ref 135–145)
Total Bilirubin: 1.9 mg/dL — ABNORMAL HIGH (ref 0.3–1.2)
Total Protein: 6.1 g/dL — ABNORMAL LOW (ref 6.5–8.1)

## 2022-01-26 LAB — SARS CORONAVIRUS 2 BY RT PCR: SARS Coronavirus 2 by RT PCR: NEGATIVE

## 2022-01-26 MED ORDER — AMOXICILLIN-POT CLAVULANATE 875-125 MG PO TABS
1.0000 | ORAL_TABLET | Freq: Two times a day (BID) | ORAL | 0 refills | Status: AC
  Filled 2022-01-26: qty 10, 5d supply, fill #0

## 2022-01-26 MED ORDER — AMOXICILLIN-POT CLAVULANATE 875-125 MG PO TABS
1.0000 | ORAL_TABLET | Freq: Two times a day (BID) | ORAL | Status: DC
  Administered 2022-01-26 (×2): 1 via ORAL
  Filled 2022-01-26 (×2): qty 1

## 2022-01-26 NOTE — Plan of Care (Signed)
  Problem: Education: Goal: Knowledge of General Education information will improve Description: Including pain rating scale, medication(s)/side effects and non-pharmacologic comfort measures Outcome: Completed/Met   Problem: Health Behavior/Discharge Planning: Goal: Ability to manage health-related needs will improve Outcome: Completed/Met   Problem: Clinical Measurements: Goal: Ability to maintain clinical measurements within normal limits will improve Outcome: Completed/Met Goal: Will remain free from infection Outcome: Completed/Met Goal: Diagnostic test results will improve Outcome: Completed/Met Goal: Respiratory complications will improve Outcome: Completed/Met Goal: Cardiovascular complication will be avoided Outcome: Completed/Met   Problem: Activity: Goal: Risk for activity intolerance will decrease Outcome: Completed/Met   Problem: Nutrition: Goal: Adequate nutrition will be maintained Outcome: Completed/Met   Problem: Coping: Goal: Level of anxiety will decrease Outcome: Completed/Met   Problem: Elimination: Goal: Will not experience complications related to bowel motility Outcome: Completed/Met Goal: Will not experience complications related to urinary retention Outcome: Completed/Met   Problem: Pain Managment: Goal: General experience of comfort will improve Outcome: Completed/Met   Problem: Safety: Goal: Ability to remain free from injury will improve Outcome: Completed/Met   Problem: Skin Integrity: Goal: Risk for impaired skin integrity will decrease Outcome: Completed/Met   Problem: Respiratory: Goal: Ability to maintain a clear airway will improve Outcome: Completed/Met

## 2022-01-26 NOTE — Discharge Summary (Addendum)
[facility]  Physician Discharge Summary   Patient ID: Jerry Garrison MRN: [id number] DOB/AGE: [birth year] 23 y.o.  Admit date: 01/24/2022 Discharge date:   01/26/2022   PCP: Pcp, No  DISCHARGE DIAGNOSES:  Principal Problem:   Aspiration pneumonia (HCC) Active Problems:   Overdose of opiate or related narcotic, accidental or unintentional, initial encounter (HCC)   Schizo affective schizophrenia (HCC)   Acute respiratory failure with hypoxia (HCC)   Transaminitis   RECOMMENDATIONS FOR OUTPATIENT FOLLOW UP: Patient to follow-up with his outpatient psychiatrist Patient instructed to follow-up and establish with a primary care provider so that the liver function test can be monitored.  Home Health: None Equipment/Devices: None  CODE STATUS: Full code  DISCHARGE CONDITION: fair  Diet recommendation: As before  INITIAL HISTORY: 21 y.o. male with medical history significant of Schizophrenia, polysubstance abuse including amphetamines, THC, now opiates apparently.  Patient's noted oxycodone, became obtunded started vomiting he had an aspiration episode.  Noted to have aspiration pneumonia.  Requiring oxygen.  Hospitalized for further management.  Consultations: Psychiatry  Procedures: None   HOSPITAL COURSE:   Aspiration pneumonia/sepsis present on admission/acute respiratory failure with hypoxia Patient had sepsis at the time of admission with fever tachycardia and leukocytosis. Chest x-ray suggested aspiration pneumonia.  Likely because of vomiting episode after he snorted oxycodone. Placed initially on Unasyn.  Was requiring oxygen.  Now weaned off of oxygen.  Transition to Augmentin this morning.  Ambulate.    Opioid overdose Patient snorted oxycodone.  Did not require Narcan.  Back to baseline now.  He was counseled.  Transaminitis Previous labs reviewed.  He has always had a degree of transaminitis.  Bilirubin has also been elevated.  Hepatitis panel and  HIV was nonreactive.  Ultrasound of the hepatobiliary system suggested some gallbladder sludge but no other abnormalities noted.  Abdomen is benign.  LFTs are stable.  Outpatient monitoring.     Schizoaffective/schizophrenia Denies any suicidal ideation.  However has been depressed.  Apparently his girlfriend left him recently.  Considering his underlying psychiatric illness he may benefit from psychiatry evaluation.  Psychiatry has been consulted.  Await their input.  Should be able to go home after he has been seen by psychiatry and once cleared by them.  Stable for discharge otherwise.  ADDENDUM Patient seen by psychiatry. They are recommending inpatient psychiatric admission and Involuntary commitment. Will cancel discharge. Patient is stable for transfer to [facility].   PERTINENT LABS:  The results of significant diagnostics from this hospitalization (including imaging, microbiology, ancillary and laboratory) are listed below for reference.     Labs:   Basic Metabolic Panel: Recent Labs  Lab 01/24/22 2110 01/25/22 0501 01/26/22 0524  NA 137 140 138  K 4.0 3.9 3.7  CL 102 105 109  CO2 24 24 24   GLUCOSE 104* 111* 105*  BUN 18 16 9   CREATININE 1.35* 1.06 0.99  CALCIUM 8.6* 8.5* 8.3*   Liver Function Tests: Recent Labs  Lab 01/24/22 2110 01/25/22 0501 01/26/22 0524  AST 121* 127* 101*  ALT 82* 72* 58*  ALKPHOS 63 55 49  BILITOT 1.8* 2.1* 1.9*  PROT 7.8 6.5 6.1*  ALBUMIN 4.3 3.4* 3.0*    CBC: Recent Labs  Lab 01/24/22 2110 01/25/22 0501 01/26/22 0524  WBC 12.6* 13.9* 12.1*  NEUTROABS 10.8*  --   --   HGB 16.3 15.0 14.0  HCT 48.6 45.3 42.8  MCV 87.4 87.8 88.1  PLT 225 194 197     CBG: Recent Labs  Lab 01/24/22 2115  GLUCAP 113*     IMAGING STUDIES US Abdomen Limited RUQ (LIVER/GB)  Result Date: 01/25/2022 CLINICAL DATA:  Transaminitis. EXAM: ULTRASOUND ABDOMEN LIMITED RIGHT UPPER QUADRANT COMPARISON:  CT abdomen and pelvis 10/22/2021 FINDINGS:  Gallbladder: No gallbladder wall thickening, pericholecystic fluid or sonographic Murphy's sign. No gallstones. There is echogenic nonshadowing gallbladder sludge. Common bile duct: Diameter: 2 mm, within normal limits. No intrahepatic or extrahepatic biliary ductal dilatation. Liver: No focal lesion identified. Within normal limits in parenchymal echogenicity. Portal vein is patent on color Doppler imaging with normal direction of blood flow towards the liver. Other: None. IMPRESSION: Gallbladder sludge without gallstones or sonographic evidence of acute cholecystitis. Electronically Signed   By: Neita Garnet M.D.   On: 01/25/2022 09:49   DG Chest Port 1 View  Result Date: 01/24/2022 CLINICAL DATA:  Low sats.  AMS reports of overdose. EXAM: PORTABLE CHEST 1 VIEW COMPARISON:  None Available. FINDINGS: The heart size and mediastinal contours are within normal limits. Right upper and lower lobe hazy opacities concerning for pneumonia. No appreciable pleural effusion. No pneumothorax. Osseous structures are unremarkable. IMPRESSION: Right upper and lower lobe opacities concerning for pneumonia. Follow-up examination to resolution is recommended. Electronically Signed   By: Larose Hires D.O.   On: 01/24/2022 23:02    DISCHARGE EXAMINATION: Vitals:   01/25/22 1443 01/25/22 1843 01/25/22 2233 01/26/22 0227  BP: (!) 136/94 113/75 121/77 122/73  Pulse: 87 96 94 (!) 103  Resp: 20 17 18 18   Temp: 98.3 F (36.8 C) 99.2 F (37.3 C) 98.9 F (37.2 C) 99.7 F (37.6 C)  TempSrc: Oral Oral Oral Oral  SpO2: 94% 98% 97% 93%  Weight:      Height:       General appearance: Awake alert.  In no distress Mild wax noted in both years.  Normal-appearing tympanic membranes. Resp: Normal effort at rest.  Few crackles at the bases.  No wheezing or rhonchi. Cardio: S1-S2 is normal regular.  No S3-S4.  No rubs murmurs or bruit GI: Abdomen is soft.  Nontender nondistended.  Bowel sounds are present normal.  No masses  organomegaly Extremities: No edema.  Full range of motion of lower extremities. Neurologic: Alert and oriented x3.  No focal neurological deficits.    DISPOSITION: Home  Discharge Instructions     Call MD for:  difficulty breathing, headache or visual disturbances   Complete by: As directed    Call MD for:  extreme fatigue   Complete by: As directed    Call MD for:  persistant dizziness or light-headedness   Complete by: As directed    Call MD for:  persistant nausea and vomiting   Complete by: As directed    Call MD for:  severe uncontrolled pain   Complete by: As directed    Call MD for:  temperature >100.4   Complete by: As directed    Diet - low sodium heart healthy   Complete by: As directed    Discharge instructions   Complete by: As directed    Please take your medications as prescribed.  Please stop doing recreational drugs.  Please follow-up with your primary care provider and have them do blood work to check your liver function tests in 1 to 2 weeks. There was no concern identified on examination of your ears this morning.  If you continue to have hearing problems then you should be seen by an ENT (ear nose and throat) doctor.  You were cared  for by a hospitalist during your hospital stay. If you have any questions about your discharge medications or the care you received while you were in the hospital after you are discharged, you can call the unit and asked to speak with the hospitalist on call if the hospitalist that took care of you is not available. Once you are discharged, your primary care physician will handle any further medical issues. Please note that NO REFILLS for any discharge medications will be authorized once you are discharged, as it is imperative that you return to your primary care physician (or establish a relationship with a primary care physician if you do not have one) for your aftercare needs so that they can reassess your need for medications and  monitor your lab values. If you do not have a primary care physician, you can call [phone] for a physician referral.   Increase activity slowly   Complete by: As directed          Allergies as of 01/26/2022       Reactions   Latex Itching        Medication List     STOP taking these medications    traZODone 50 MG tablet Commonly known as: DESYREL       TAKE these medications    amoxicillin-clavulanate 875-125 MG tablet Commonly known as: AUGMENTIN Take 1 tablet by mouth every 12 (twelve) hours for 5 days.   Invega Sustenna 156 MG/ML Susy injection Generic drug: paliperidone Inject 1 mL (156 mg total) into the muscle every 28 (twenty-eight) days.           TOTAL DISCHARGE TIME: 35 minutes  Hermena Swint [employer] on [email or web address]  01/26/2022, 1:14 PM

## 2022-01-26 NOTE — Progress Notes (Signed)
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
## 2022-01-26 NOTE — Progress Notes (Signed)
Escorted off unit via [employer] at 2314

## 2022-01-26 NOTE — TOC Transition Note (Signed)
Transition of Care [facility]) - CM/SW Discharge Note   Patient Details  Name: Jacarius Handel MRN: [id number] Date of Birth: 07/19/2000  Transition of Care [facility] CM/SW Contact:  Otelia Santee, LCSW Phone Number: 01/26/2022, 3:38 PM   Clinical Narrative:    Per Miki Kins, pt has been accepted to [facility] pending a negative COVID test. Bed number is 307-2. Accepting provider is [place]. Number for report is [phone]. Pt has been IVC'd and ideally can be transported by [employer] at same time IVC is served to pt. If [employer] unable to transport at the same time then RN will need to call non-emergency number at [phone] to schedule transport with [employer].        Final next level of care: Psychiatric Hospital Barriers to Discharge: No Barriers Identified   Patient Goals and CMS Choice        Discharge Placement                       Discharge Plan and Services In-house Referral: Clinical Social Work   Post Acute Care Choice: NA          DME Arranged: N/A DME Agency: NA                  Social Determinants of Health (SDOH) Interventions     Readmission Risk Interventions    01/26/2022    3:37 PM  Readmission Risk Prevention Plan  Transportation Screening Complete  PCP or Specialist Appt within 5-7 Days Complete  Home Care Screening Complete  Medication Review (RN CM) Complete

## 2022-01-27 ENCOUNTER — Other Ambulatory Visit: Payer: Self-pay

## 2022-01-27 ENCOUNTER — Inpatient Hospital Stay (HOSPITAL_COMMUNITY)
Admission: AD | Admit: 2022-01-27 | Discharge: 2022-01-31 | DRG: 885 | Disposition: A | Discharge status: Home / Self Care | Payer: BLUE CROSS/BLUE SHIELD | Source: Intra-hospital | Attending: Psychiatry | Admitting: Psychiatry

## 2022-01-27 ENCOUNTER — Encounter (HOSPITAL_COMMUNITY): Payer: Self-pay | Admitting: Psychiatry

## 2022-01-27 ENCOUNTER — Other Ambulatory Visit (HOSPITAL_COMMUNITY): Payer: Self-pay

## 2022-01-27 DIAGNOSIS — G4700 Insomnia, unspecified: Secondary | ICD-10-CM | POA: Diagnosis present

## 2022-01-27 DIAGNOSIS — F209 Schizophrenia, unspecified: Secondary | ICD-10-CM | POA: Diagnosis present

## 2022-01-27 DIAGNOSIS — F1210 Cannabis abuse, uncomplicated: Secondary | ICD-10-CM | POA: Diagnosis present

## 2022-01-27 DIAGNOSIS — J690 Pneumonitis due to inhalation of food and vomit: Secondary | ICD-10-CM | POA: Diagnosis present

## 2022-01-27 DIAGNOSIS — Z20822 Contact with and (suspected) exposure to covid-19: Secondary | ICD-10-CM | POA: Diagnosis present

## 2022-01-27 DIAGNOSIS — F259 Schizoaffective disorder, unspecified: Secondary | ICD-10-CM

## 2022-01-27 DIAGNOSIS — F201 Disorganized schizophrenia: Secondary | ICD-10-CM | POA: Diagnosis not present

## 2022-01-27 DIAGNOSIS — Z5902 Unsheltered homelessness: Secondary | ICD-10-CM

## 2022-01-27 DIAGNOSIS — F17200 Nicotine dependence, unspecified, uncomplicated: Secondary | ICD-10-CM

## 2022-01-27 DIAGNOSIS — R45851 Suicidal ideations: Secondary | ICD-10-CM | POA: Diagnosis present

## 2022-01-27 DIAGNOSIS — F1290 Cannabis use, unspecified, uncomplicated: Secondary | ICD-10-CM

## 2022-01-27 MED ORDER — OLANZAPINE 5 MG PO TBDP: 5.0000 mg | ORAL_TABLET | Freq: Three times a day (TID) | ORAL | Status: DC | PRN

## 2022-01-27 MED ORDER — AMOXICILLIN-POT CLAVULANATE 875-125 MG PO TABS
1.0000 | ORAL_TABLET | Freq: Two times a day (BID) | ORAL | Status: DC
  Administered 2022-01-27 – 2022-01-31 (×8): 1 via ORAL
  Filled 2022-01-27 (×11): qty 1

## 2022-01-27 MED ORDER — AMOXICILLIN-POT CLAVULANATE 875-125 MG PO TABS
1.0000 | ORAL_TABLET | Freq: Two times a day (BID) | ORAL | Status: DC
  Administered 2022-01-27: 1 via ORAL
  Filled 2022-01-27 (×4): qty 1

## 2022-01-27 MED ORDER — ALUM & MAG HYDROXIDE-SIMETH 200-200-20 MG/5ML PO SUSP: 30.0000 mL | ORAL | Status: DC | PRN

## 2022-01-27 MED ORDER — PALIPERIDONE ER 6 MG PO TB24
6.0000 mg | ORAL_TABLET | Freq: Every day | ORAL | Status: DC
  Administered 2022-01-27 – 2022-01-28 (×2): 6 mg via ORAL
  Filled 2022-01-27 (×4): qty 1

## 2022-01-27 MED ORDER — NICOTINE 14 MG/24HR TD PT24
14.0000 mg | MEDICATED_PATCH | Freq: Every day | TRANSDERMAL | Status: DC
  Administered 2022-01-27 – 2022-01-30 (×4): 14 mg via TRANSDERMAL
  Filled 2022-01-27 (×9): qty 1

## 2022-01-27 MED ORDER — HYDROXYZINE HCL 25 MG PO TABS
25.0000 mg | ORAL_TABLET | Freq: Three times a day (TID) | ORAL | Status: DC | PRN
  Administered 2022-01-28 (×2): 25 mg via ORAL
  Filled 2022-01-27 (×2): qty 1

## 2022-01-27 MED ORDER — ACETAMINOPHEN 325 MG PO TABS
650.0000 mg | ORAL_TABLET | Freq: Four times a day (QID) | ORAL | Status: DC | PRN
  Administered 2022-01-27 – 2022-01-28 (×2): 650 mg via ORAL
  Filled 2022-01-27 (×2): qty 2

## 2022-01-27 MED ORDER — PALIPERIDONE PALMITATE ER 234 MG/1.5ML IM SUSY
234.0000 mg | PREFILLED_SYRINGE | Freq: Once | INTRAMUSCULAR | Status: AC
Start: 2022-01-27 — End: 2022-01-27
  Administered 2022-01-27: 234 mg via INTRAMUSCULAR
  Filled 2022-01-27: qty 1.5

## 2022-01-27 MED ORDER — TRAZODONE HCL 50 MG PO TABS
50.0000 mg | ORAL_TABLET | Freq: Every evening | ORAL | Status: DC | PRN
  Administered 2022-01-28: 50 mg via ORAL
  Filled 2022-01-27: qty 1

## 2022-01-27 MED ORDER — ZIPRASIDONE MESYLATE 20 MG IM SOLR: 20.0000 mg | Freq: Three times a day (TID) | INTRAMUSCULAR | Status: DC | PRN

## 2022-01-27 MED ORDER — LORAZEPAM 1 MG PO TABS
1.0000 mg | ORAL_TABLET | Freq: Three times a day (TID) | ORAL | Status: AC | PRN
  Administered 2022-01-28: 1 mg via ORAL
  Filled 2022-01-27: qty 1

## 2022-01-27 MED ORDER — MAGNESIUM HYDROXIDE 400 MG/5ML PO SUSP: 30.0000 mL | Freq: Every day | ORAL | Status: DC | PRN

## 2022-01-27 NOTE — H&P (Signed)
Psychiatric Admission Assessment Adult  Patient Identification: Jerry Garrison MRN:  [id number] Date of Evaluation:  01/27/2022 Chief Complaint:  Schizophrenia, unspecified (HCC) [F20.9] Principal Diagnosis: Schizophrenia, unspecified (HCC) Diagnosis:  Principal Problem:   Schizophrenia, unspecified (HCC)  History of Present Illness:  Patient is a 21 year old male previously diagnosed with schizophrenia, who was admitted to the psychiatric hospital after overdose on heroin, and concern for suicidal thoughts.  Patient was discharged in February 2023 from this hospital.  He was medically admitted, and treated for aspiration pneumonia after vomiting while obtunded.  He is on Augmentin for this.  Current outpatient psychiatric medications: Patient reports not taking any outpatient psychiatric medications, since discharge from the hospital in February 2023.  On evaluation today, the patient reports he recently stopped living with his mother, and has been living in a tent on [address] for about 1 week.  He reports this is "because my mother talks a lot".  He is unable to identify any other conflicts or stressors related to his becoming homeless.  He reports the last 1 week also using heroin, intranasally.  He is aware of his overdose and aspiration pneumonia.  He reports not using heroin before this.  He is unable to identify other stressors.  He does report that he is unemployed for 2 to 3 months, and "steals" in order to obtain necessities for living.  Of note, he is disheveled and odorous during the interview, and was tearful at times during the interview.  In regards to his psychiatric symptoms, he reports that his mood is "nonchalant" denies pervasive sadness.  Reports anhedonia for many weeks.  Reports that sleep is okay, getting about 8 hours of sleep per night, even in the context of homelessness and living in a tent.  In regards to appetite, the patient reports "I am not sure".  Reports energy is  okay.  He does report having passive suicidal thoughts at this time.  Denies any intent at this time.  Patient reports that he does "not yet" have a plan for suicide.  Denies any HI.  Denies that anxiety is a problem for him.  Denies any panic attacks.  Reports having auditory hallucinations, off and on, but his mom saying "tay".  Denies visualizations.  Denies paranoia.  Denies having any other psychotic symptoms.  When screened for bipolar disorder, he does not report having symptoms meeting criteria for a hypomanic or manic episode at this time or in the past.  He denies any history of trauma or abuse.  Per consult evaluation on 01-26-2022: Collateral information obtained from Luiz Ochoa (mother): As per mother she remains very concerned about her son, and his substance use and addiction.  Mother reports patient recently posted on his Instagram page about suicide and needing a gun.  She is concerned that he is going to hurt himself, she does not want to lose her son.  Additional information is obtained regarding recent suicide post, she is able to call her daughter on [employer]-way who confirms to recent pulse about suicidality and method that gun shot wound to the chest.     Past psychiatric history: Schizophrenia Cannabis use disorder Patient reports being hospitalized 2 times in the past, most recently at this hospital in February 2023 History of suicide attempt "I am not sure".  Of note, the patient was admitted to her last hospitalization for psychosis and suicidal thoughts with plan to jump off a parking deck. Past psychiatric medication history: Zyprexa and Invega per chart review.  Patient on exam, is not sure.  Past medical history: Aspiration pneumonia.  Augmentin for 5 days Denies seizure history Denies surgical history Denies any allergies  Substance use history: Patient reports using heroin for 1 week, and not reported this.  He did overdose on heroin, see above Reports using  marijuana Reports currently 1/2 pack/day of cigarettes Reports drinking 1 can of beer per day  Denies any psychiatric family history.  Denies any suicide attempts in the family.  Social history: Reports homelessness for 1 week, previously living with mother.  Is single.  Reports he has 1 son, who is 55 years old, who lives in [place].  Reports he is unemployed for the last 2 to 3 months and skills to provide basic necessities.   Total Time spent with patient: 45 minutes    Is the patient at risk to self? Yes.    Has the patient been a risk to self in the past 6 months? Yes.    Has the patient been a risk to self within the distant past? unclear  Is the patient a risk to others? No.  Has the patient been a risk to others in the past 6 months? No.  Has the patient been a risk to others within the distant past? No.   [place] Scale:  Flowsheet Row Ad[facility] (Current) [facility] Hosp-Admission (Discharged) from 01[facility]24/2022 in [facility] from 10/22/2021 in [facility]  C-SSRS RISK CATEGORY No Risk No Risk Moderate Risk        Prior Inpatient Therapy:   Prior Outpatient Therapy:    Alcohol Screening: Patient refused Alcohol Screening Tool: Yes 1. How often do you have a drink containing alcohol?: Never 2. How many drinks containing alcohol do you have on a typical day when you are drinking?: 1 or 2 3. How often do you have six or more drinks on one occasion?: Never AUDIT-C Score: 0 Substance Abuse History in the last 12 months:  Yes.   Consequences of Substance Abuse: Negative Medical Consequences:  psychiatric,overdose Family Consequences:    . Previous Psychotropic Medications: Yes  Psychological Evaluations: Yes  Past Medical History:  Past Medical History:  Diagnosis Date   Schizophrenia (HCC)    Substance abuse (HCC)     Past Surgical  History:  Procedure Laterality Date   NO PAST SURGERIES     Family History: History reviewed. No pertinent family history.  Tobacco Screening:   1/2 ppd  Social History:  Social History   Substance and Sexual Activity  Alcohol Use No     Social History   Substance and Sexual Activity  Drug Use Yes   Types: Marijuana, Oxycodone    Additional Social History:                           Allergies:   Allergies  Allergen Reactions   Latex Itching   Lab Results:  Results for orders placed or performed during the hospital encounter of 01/24/22 (from the past 48 hour(s))  CBC     Status: Abnormal   Collection Time: 01/26/22  5:24 AM  Result Value Ref Range   WBC 12.1 (H) 4.0 - 10.5 K/uL   RBC 4.86 4.22 - 5.81 MIL/uL   Hemoglobin 14.0 13.0 - 17.0 g/dL   HCT 75.1 02.5 - 85.2 %   MCV 88.1 80.0 -  100.0 fL   MCH 28.8 26.0 - 34.0 pg   MCHC 32.7 30.0 - 36.0 g/dL   RDW 16.111.8 09.611.5 - 04.515.5 %   Platelets 197 150 - 400 K/uL   nRBC 0.0 0.0 - 0.2 %    Comment: Performed at [facility], [address] [place], [place] [address]  Comprehensive metabolic panel     Status: Abnormal   Collection Time: 01/26/22  5:24 AM  Result Value Ref Range   Sodium 138 135 - 145 mmol/L   Potassium 3.7 3.5 - 5.1 mmol/L   Chloride 109 98 - 111 mmol/L   CO2 24 22 - 32 mmol/L   Glucose, Bld 105 (H) 70 - 99 mg/dL    Comment: Glucose reference range applies only to samples taken after fasting for at least 8 hours.   BUN 9 6 - 20 mg/dL   Creatinine, Ser 1.910.99 0.61 - 1.24 mg/dL   Calcium 8.3 (L) 8.9 - 10.3 mg/dL   Total Protein 6.1 (L) 6.5 - 8.1 g/dL   Albumin 3.0 (L) 3.5 - 5.0 g/dL   AST 478101 (H) 15 - 41 U/L   ALT 58 (H) 0 - 44 U/L   Alkaline Phosphatase 49 38 - 126 U/L   Total Bilirubin 1.9 (H) 0.3 - 1.2 mg/dL   GFR, Estimated >29>60 >56>60 mL/min    Comment: (NOTE) Calculated using the CKD-EPI Creatinine Equation (2021)    Anion gap 5 5 - 15    Comment: Performed at [facility], [address] [place], [place] [address]  SARS Coronavirus 2 by RT PCR (hospital order, performed in [facility] hospital lab) *cepheid single result test* Anterior Nasal Swab     Status: None   Collection Time: 01/26/22  3:10 PM   Specimen: Anterior Nasal Swab  Result Value Ref Range   SARS Coronavirus 2 by RT PCR NEGATIVE NEGATIVE    Comment: (NOTE) SARS-CoV-2 target nucleic acids are NOT DETECTED.  The SARS-CoV-2 RNA is generally detectable in upper and lower respiratory specimens during the acute phase of infection. The lowest concentration of SARS-CoV-2 viral copies this assay can detect is 250 copies / mL. A negative result does not preclude SARS-CoV-2 infection and should not be used as the sole basis for treatment or other patient management decisions.  A negative result may occur with improper specimen collection / handling, submission of specimen other than nasopharyngeal swab, presence of viral mutation(s) within the areas targeted by this assay, and inadequate number of viral copies (<250 copies / mL). A negative result must be combined with clinical observations, patient history, and epidemiological information.  Fact Sheet for Patients:   RoadLapTop.co.zahttps://www.fda.gov/media/158405/download  Fact Sheet for Healthcare Providers: http://kim-miller.com/https://www.fda.gov/media/158404/download  This test is not yet approved or  cleared by the [place] FDA and has been authorized for detection and/or diagnosis of SARS-CoV-2 by FDA under an Emergency Use Authorization (EUA).  This EUA will remain in effect (meaning this test can be used) for the duration of the COVID-19 declaration under Section 564(b)(1) of the Act, 21 U.S.C. section 360bbb-3(b)(1), unless the authorization is terminated or revoked sooner.  Performed at [facility], [address] [place], [place] [address]     Blood Alcohol level:  Lab Results  Component Value Date   [facility]  [facility]   ETH <10 10/22/2021    Metabolic Disorder Labs:  Lab Results  Component Value Date   HGBA1C 4.4 (L) 08/07/2021   MPG  79.58 08/07/2021   No results found for: "PROLACTIN" Lab Results  Component Value Date   CHOL 135 08/07/2021   TRIG 26 08/07/2021   HDL 52 08/07/2021   CHOLHDL 2.6 08/07/2021   VLDL 5 08/07/2021   LDLCALC 78 08/07/2021    Current Medications: Current Facility-Administered Medications  Medication Dose Route Frequency Provider Last Rate Last Admin   acetaminophen (TYLENOL) tablet 650 mg  650 mg Oral Q6H PRN Onuoha, Chinwendu V, NP       alum & mag hydroxide-simeth (MAALOX/MYLANTA) 200-200-20 MG/5ML suspension 30 mL  30 mL Oral Q4H PRN Onuoha, Chinwendu V, NP       amoxicillin-clavulanate (AUGMENTIN) 875-125 MG per tablet 1 tablet  1 tablet Oral Q12H Onuoha, Chinwendu V, NP   1 tablet at 01/27/22 0932   hydrOXYzine (ATARAX) tablet 25 mg  25 mg Oral TID PRN Iria Jamerson, Harrold Donath, MD       OLANZapine zydis (ZYPREXA) disintegrating tablet 5 mg  5 mg Oral Q8H PRN Telisa Ohlsen, MD       And   LORazepam (ATIVAN) tablet 1 mg  1 mg Oral Q8H PRN Romario Tith, MD       And   ziprasidone (GEODON) injection 20 mg  20 mg Intramuscular Q8H PRN Analaya Hoey, MD       magnesium hydroxide (MILK OF MAGNESIA) suspension 30 mL  30 mL Oral Daily PRN Onuoha, Chinwendu V, NP       traZODone (DESYREL) tablet 50 mg  50 mg Oral QHS PRN Mariadejesus Cade, Harrold Donath, MD       PTA Medications: Medications Prior to Admission  Medication Sig Dispense Refill Last Dose   amoxicillin-clavulanate (AUGMENTIN) 875-125 MG tablet Take 1 tablet by mouth every 12 (twelve) hours for 5 days. 10 tablet 0    paliperidone (INVEGA SUSTENNA) 156 MG/ML SUSY injection Inject 1 mL (156 mg total) into the muscle every 28 (twenty-eight) days. 1 mL 11 Unknown    Musculoskeletal: Strength & Muscle Tone: within normal limits Gait & Station: normal Patient leans:  N/A            Psychiatric Specialty Exam:  Presentation  General Appearance: Disheveled (odorous)  Eye Contact:Poor  Speech:Slow  Speech Volume:Decreased  Handedness:Right   Mood and Affect  Mood:Anxious; Dysphoric  Affect:Flat   Thought Process  Thought Processes:Linear  Duration of Psychotic Symptoms: Greater than six months  Past Diagnosis of Schizophrenia or Psychoactive disorder: Yes  Descriptions of Associations:Intact  Orientation:Partial  Thought Content:Logical  Hallucinations:Hallucinations: Auditory  Ideas of Reference:None  Suicidal Thoughts:Suicidal Thoughts: Yes, Passive SI Passive Intent and/or Plan: Without Intent; Without Plan  Homicidal Thoughts:Homicidal Thoughts: No   Sensorium  Memory:Immediate Fair; Recent Fair; Remote Fair  Judgment:Impaired  Insight:Lacking   Executive Functions  Concentration:Poor  Attention Span:Poor  Recall:Fair  Fund of Knowledge:Fair  Language:Fair   Psychomotor Activity  Psychomotor Activity:Psychomotor Activity: Normal   Assets  Assets:Communication Skills; Housing; Physical Health   Sleep  Sleep:Sleep: Fair    Physical Exam: Physical Exam Vitals reviewed.  Constitutional:      Comments: Odorous, poor eye contact, low responsiveness during interview, not caring for self   Pulmonary:     Effort: Pulmonary effort is normal.  Neurological:     Motor: No weakness.     Gait: Gait normal.    Review of Systems  Psychiatric/Behavioral:  Positive for depression, hallucinations, substance abuse and suicidal ideas.    Blood pressure 122/87, pulse 65, temperature 98.6 F (37 C), temperature source Oral, resp.  rate 18, height 5\' 10"  (1.778 m), weight 65.2 kg, SpO2 98 %. Body mass index is 20.63 kg/m.  Treatment Plan Summary: Daily contact with patient to assess and evaluate symptoms and progress in treatment  ASSESSMENT:  Diagnoses / Active Problems:  -Schizophrenia,  unspecified type -Cannabis use disorder -Tobacco use disorder -Rule out opioid use disorder  PLAN: Safety and Monitoring:  -- Involuntary admission to inpatient psychiatric unit for safety, stabilization and treatment  -- Daily contact with patient to assess and evaluate symptoms and progress in treatment  -- Patient's case to be discussed in multi-disciplinary team meeting  -- Observation Level : q15 minute checks  -- Vital signs:  q12 hours  -- Precautions: suicide, elopement, and assault  2. Psychiatric Diagnoses and Treatment:    -Continue Augmentin for aspiration pneumonia -Start Invega 6 mg p.o. nightly for schizophrenia.  Patient previously tolerated Invega, without any reported side effects. -Start Sustenna 234 mg once daily IM LAI today.  For schizophrenia.  Patient reports no side effects after receiving this medication in February 2023.  He reports he did not follow up for subsequent injection.  Therefore we will keep the patient hospitalized until he receives his second dose of this medication in 4 to 7 days.  We will continue p.o. Invega until the patient receives the second March 2023 loading dose injection. QTc is 444 -We will continue to monitor for depressive symptoms, and start antidepressant if necessary.  We will first treat the psychotic disorder.  --  The risks/benefits/side-effects/alternatives to this medication were discussed in detail with the patient and time was given for questions. The patient consents to medication trial.    -- Metabolic profile and EKG monitoring obtained while on an atypical antipsychotic (BMI: Lipid Panel: HbgA1c: QTc:) 444  -- Encouraged patient to participate in unit milieu and in scheduled group therapies   -- Short Term Goals: Ability to identify changes in lifestyle to reduce recurrence of condition will improve, Ability to verbalize feelings will improve, Ability to disclose and discuss suicidal ideas, Ability to  demonstrate self-control will improve, Ability to identify and develop effective coping behaviors will improve, Ability to maintain clinical measurements within normal limits will improve, Compliance with prescribed medications will improve, and Ability to identify triggers associated with substance abuse/mental health issues will improve  -- Long Term Goals: Improvement in symptoms so as ready for discharge    3. Medical Issues Being Addressed:   Tobacco Use Disorder  -- Nicotine patch 21mg /24 hours ordered  -- Smoking cessation encouraged  4. Discharge Planning:   -- Social work and case management to assist with discharge planning and identification of hospital follow-up needs prior to discharge  -- Estimated LOS: 5-7 days  -- Discharge Concerns: Need to establish a safety plan; Medication compliance and effectiveness  -- Discharge Goals: Return home with outpatient referrals for mental health follow-up including medication management/psychotherapy     Observation Level/Precautions:  Detox 15 minute checks  Laboratory: See above  Psychotherapy:    Medications:    Consultations:    Discharge Concerns:    Estimated LOS: 7 days  Other:     Physician Treatment Plan for Primary Diagnosis: Schizophrenia, unspecified [facility])   Physician Treatment Plan for Secondary Diagnosis: Principal Problem:   Schizophrenia, unspecified (HCC)   I certify that inpatient services furnished can reasonably be expected to improve the patient's condition.    12-23-1985, MD 7/25/202310:37 AM  Total Time Spent in Direct Patient Care:  I  personally spent 60 minutes on the unit in direct patient care. The direct patient care time included face-to-face time with the patient, reviewing the patient's chart, communicating with other professionals, and coordinating care. Greater than 50% of this time was spent in counseling or coordinating care with the patient regarding goals of hospitalization,  psycho-education, and discharge planning needs.   Phineas Inches, MD Psychiatrist

## 2022-01-27 NOTE — Group Note (Signed)
Recreation Therapy Group Note   Group Topic:Animal Assisted Therapy   Group Date: 01/27/2022 Start Time: 1430 End Time: 1510 Facilitators: Caroll Rancher, LRT,CTRS Location: [address] Dayroom   Animal-Assisted Activity (AAA) Program Checklist/Progress Notes Patient Eligibility Criteria Checklist & Daily Group note for Rec Tx Intervention  AAA/T Program Assumption of Risk Form signed by Patient/ or Parent Legal Guardian Yes  Patient understands his/her participation is voluntary Yes   Affect/Mood: N/A   Participation Level: Did not attend    Clinical Observations/Individualized Feedback:     Plan: Continue to engage patient in RT group sessions 2-3x/week.   Caroll Rancher, Antonietta Jewel 01/27/2022 3:49 PM

## 2022-01-27 NOTE — Plan of Care (Signed)
  Problem: Activity: Goal: Interest or engagement in activities will improve Outcome: Progressing   Problem: Coping: Goal: Ability to verbalize frustrations and anger appropriately will improve Outcome: Progressing   Problem: Coping: Goal: Ability to demonstrate self-control will improve Outcome: Progressing   Problem: Safety: Goal: Periods of time without injury will increase Outcome: Progressing   
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
## 2022-01-27 NOTE — Progress Notes (Signed)
The patient rated his day as a 7 out of 10 since he is trying to take things slowly. His positive event for the day is that he spoke with the mother of his children.

## 2022-01-27 NOTE — Plan of Care (Signed)
  Problem: Education: Goal: Emotional status will improve Outcome: Not Progressing Goal: Mental status will improve Outcome: Not Progressing   

## 2022-01-27 NOTE — Progress Notes (Signed)
21 year old male admitted for schizoaffective disorder. The patient reports no known allergies, not taking any illicit drugs, and being compliant with his home medications. The patient does have a complaint of a blister on his left heel. The patient stated that he has been doing a lot of walking. He has no pcp, last dental visit is unknown, and has been hospitalized twice in the last 12 months. He is currently taking zyprexa. He reports that he lives alone and is able to complete his ADLs. He denis SI/HI/AVH. The patient was transported to [facility] via [employer], he was wearing a blue gown, with khaki shorts, and grey hospital socks. The skin search was unremarkable, Lajoyce Corners was the second person on the skin search. His mother is his support system, he is working on getting discharged as his goals while at [facility].   Angelina Sheriff, RN

## 2022-01-27 NOTE — Progress Notes (Signed)
   01/27/22 1145  Psych Admission Type (Psych Patients Only)  Admission Status Involuntary  Psychosocial Assessment  Patient Complaints Sleep disturbance  Eye Contact Brief  Facial Expression Flat  Affect Appropriate to circumstance  Speech Logical/coherent  Interaction Minimal  Motor Activity Other (Comment) (WNL)  Appearance/Hygiene Unremarkable  Behavior Characteristics Cooperative;Calm  Mood Pleasant  Thought Process  Coherency WDL  Content WDL  Delusions None reported or observed  Perception WDL  Hallucination None reported or observed  Judgment Impaired  Confusion None  Danger to Self  Current suicidal ideation? Denies  Danger to Others  Danger to Others None reported or observed

## 2022-01-28 ENCOUNTER — Encounter (HOSPITAL_COMMUNITY): Payer: Self-pay

## 2022-01-28 MED ORDER — TRAZODONE HCL 100 MG PO TABS
100.0000 mg | ORAL_TABLET | Freq: Every day | ORAL | Status: DC
  Administered 2022-01-28 – 2022-01-30 (×3): 100 mg via ORAL
  Filled 2022-01-28 (×6): qty 1

## 2022-01-28 MED ORDER — PALIPERIDONE ER 6 MG PO TB24
6.0000 mg | ORAL_TABLET | Freq: Every day | ORAL | Status: DC
  Administered 2022-01-29: 6 mg via ORAL
  Filled 2022-01-28 (×2): qty 1

## 2022-01-28 NOTE — Progress Notes (Signed)
   01/28/22 2300  Psych Admission Type (Psych Patients Only)  Admission Status Involuntary  Psychosocial Assessment  Patient Complaints Sleep disturbance  Eye Contact Brief  Facial Expression Flat  Affect Appropriate to circumstance  Speech Logical/coherent  Interaction Minimal  Motor Activity Other (Comment)  Appearance/Hygiene Unremarkable  Behavior Characteristics Cooperative;Calm  Mood Pleasant  Thought Process  Coherency WDL  Content WDL  Delusions None reported or observed  Perception WDL  Hallucination None reported or observed  Judgment Impaired  Confusion None  Danger to Self  Current suicidal ideation? Denies (Denies)  Danger to Others  Danger to Others None reported or observed

## 2022-01-28 NOTE — Group Note (Signed)
Recreation Therapy Group Note   Group Topic:Stress Management  Group Date: 01/28/2022 Start Time: 0935 End Time: 0955 Facilitators: Caroll Rancher, LRT,CTRS Location: [address] Dayroom   Goal Area(s) Addresses:  Patient will actively participate in stress management techniques presented during session.  Patient will successfully identify benefit of practicing stress management post d/c.   Group Description: Guided Imagery. LRT provided education, instruction, and demonstration on practice of visualization via guided imagery. Patient was asked to participate in the technique introduced during session. LRT debriefed including topics of mindfulness, stress management and specific scenarios each patient could use these techniques. Patients were given suggestions of ways to access scripts post d/c and encouraged to explore Youtube and other apps available on smartphones, tablets, and computers.   Affect/Mood: N/A   Participation Level: Did not attend    Clinical Observations/Individualized Feedback:     Plan: Continue to engage patient in RT group sessions 2-3x/week.   Caroll Rancher, LRT,CTRS 01/28/2022 12:21 PM

## 2022-01-28 NOTE — Progress Notes (Signed)
Pt attended NA group, and actively participated. 

## 2022-01-28 NOTE — Progress Notes (Signed)
Pt denies SI/HI/AVH.  Pt encouraged to take a shower today. Pt took medications without incident and no side effects were noted.  RN will continue to monitor pt's progress and provide support as needed.

## 2022-01-28 NOTE — B[facility] Counselor (Signed)
Adult Comprehensive Assessment  Patient ID: Yaviel Kloster, male   DOB: [birth year], 21 y.o.   MRN: [id number]  Information Source: Information source: Patient  Current Stressors:  Patient states their primary concerns and needs for treatment are:: "... this was my second time snorting cocaine, it didn't affect me like this the first time, I was not able to move and I also used the bathroom on myself" Patient states their goals for this hospitilization and ongoing recovery are:: " I want to apply for social security disability" Educational / Learning stressors: Pt denies Employment / Job issues: Pt denies Family Relationships: Pt denies Surveyor, quantity / Lack of resources (include bankruptcy): Pt denies Housing / Lack of housing: " I live in a tent [address] of [address], I like living there and I plan to go back there" Physical health (include injuries & life threatening diseases): Pt denies Social relationships: Pt denies Substance abuse: "... I snorted herione I started smoking marijuana when I was 39 my brother introduced me to it" Bereavement / Loss: Pt denies  Living/Environment/Situation:  Living Arrangements: Alone Living conditions (as described by patient or guardian): " I am homeless" Who else lives in the home?: " I live by myself" How long has patient lived in current situation?: 1 mth What is atmosphere in current home: Comfortable  Family History:  Marital status: Single Are you sexually active?: No What is your sexual orientation?: Straight Has your sexual activity been affected by drugs, alcohol, medication, or emotional stress?: no Does patient have children?: Yes How many children?: 1 How is patient's relationship with their children?: distant relationship, 107 year old  Childhood History:  By whom was/is the patient raised?: Mother Additional childhood history information: Patient states that he had a great childhood and had lots of siblings. Patient states that he was  free to do what he wants Description of patient's relationship with caregiver when they were a child: I think it was good Patient's description of current relationship with people who raised him/her: " we have a good relationship" How were you disciplined when you got in trouble as a child/adolescent?: Patient states that he rarely was in trouble- wasn't disciplined much Does patient have siblings?: Yes Number of Siblings: 9 Description of patient's current relationship with siblings: patient reports that his relationship right now is distant with siblings Did patient suffer any verbal/emotional/physical/sexual abuse as a child?: No Did patient suffer from severe childhood neglect?: No Has patient ever been sexually abused/assaulted/raped as an adolescent or adult?: No Was the patient ever a victim of a crime or a disaster?: No Witnessed domestic violence?: No Has patient been affected by domestic violence as an adult?: No  Education:  Highest grade of school patient has completed: 12 grade- 1 year of college in [place] Currently a student?: No Learning disability?: No  Employment/Work Situation:   Employment Situation: Unemployed Patient's Job has Been Impacted by Current Illness: No What is the Longest Time Patient has Held a Job?: 2 years Where was the Patient Employed at that Time?: [employer] Has Patient ever Been in the [employer]?: No  Financial Resources:   Financial resources: No income Does patient have a Lawyer or guardian?: No  Alcohol/Substance Abuse:   What has been your use of drugs/alcohol within the last 12 months?: " I used herione a couple of days ago" If attempted suicide, did drugs/alcohol play a role in this?: No Alcohol/Substance Abuse Treatment Hx: Denies past history If yes, describe treatment: na Has alcohol/substance abuse  ever caused legal problems?: No  Social Support System:   Patient's Community Support System: Fair Personal assistant System: " my mother is very suppotive" Type of faith/religion: none How does patient's faith help to cope with current illness?: none  Leisure/Recreation:   Do You Have Hobbies?: Yes Leisure and Hobbies: Music, both listening to and making  Strengths/Needs:   What is the patient's perception of their strengths?: " I am very kind and polite" Patient states they can use these personal strengths during their treatment to contribute to their recovery: na Patient states these barriers may affect/interfere with their treatment: homelessness Patient states these barriers may affect their return to the community: none Other important information patient would like considered in planning for their treatment: none  Discharge Plan:   Patient states concerns and preferences for aftercare planning are: " I would like to go home" Patient states they will know when they are safe and ready for discharge when: " I am ready now" Does patient have access to transportation?: No (" my family may come and get me") Does patient have financial barriers related to discharge medications?: No Patient description of barriers related to discharge medications: " i don't know" Plan for no access to transportation at discharge: " my mother may come and get me" Will patient be returning to same living situation after discharge?: Yes  Summary/Recommendations:   Summary and Recommendations (to be completed by the evaluator): Bari is a 21 year old male voluntarily admitted to [facility] from [facility] due to 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 Pt reported that he recently snorted heroine and was not able to move his body, pt also reported defecating on himself. Pt reported that he lives in a tent on [address] and plans to return. Pt reported that his mother has a home and is supportive however he does not want to live with her. Pt denies any stressors however requested a resource for applying for Social Security  Disability. Pt denies SI/HI/AVH. Pt reported last usage of heroine a couple of days ago. Pt reported no community supports. Patient will benefit from crisis stabilization, medication evaluation, group therapy and psychoeducation, in addition to case management for discharge planning. At discharge it is recommended that Patient adhere to the established discharge plan and continue in treatment.  Chianna Spirito R. 01/28/2022

## 2022-01-28 NOTE — BH IP Treatment Plan (Signed)
Interdisciplinary Treatment and Diagnostic Plan Update  01/28/2022 Time of Session: 9:25am  Emitt Maglione MRN: [id number]  Principal Diagnosis: Schizophrenia, unspecified ([place])  Secondary Diagnoses: Principal Problem:   Schizophrenia, unspecified ([place]) Active Problems:   Tobacco use disorder   Cannabis use disorder   Current Medications:  Current Facility-Administered Medications  Medication Dose Route Frequency Provider Last Rate Last Admin   acetaminophen (TYLENOL) tablet 650 mg  650 mg Oral Q6H PRN Onuoha, Chinwendu V, NP   650 mg at 01/28/22 1038   alum & mag hydroxide-simeth (MAALOX/MYLANTA) 200-200-20 MG/5ML suspension 30 mL  30 mL Oral Q4H PRN Onuoha, Chinwendu V, NP       amoxicillin-clavulanate (AUGMENTIN) 875-125 MG per tablet 1 tablet  1 tablet Oral Q12H Massengill, Ovid Curd, MD   1 tablet at 01/28/22 1036   hydrOXYzine (ATARAX) tablet 25 mg  25 mg Oral TID PRN Janine Limbo, MD   25 mg at 01/28/22 1039   magnesium hydroxide (MILK OF MAGNESIA) suspension 30 mL  30 mL Oral Daily PRN Onuoha, Chinwendu V, NP       nicotine (NICODERM CQ - dosed in mg/24 hours) patch 14 mg  14 mg Transdermal Daily Massengill, Ovid Curd, MD   14 mg at 01/28/22 1037   OLANZapine zydis (ZYPREXA) disintegrating tablet 5 mg  5 mg Oral Q8H PRN Massengill, Ovid Curd, MD       And   ziprasidone (GEODON) injection 20 mg  20 mg Intramuscular Q8H PRN Massengill, Ovid Curd, MD       Derrill Memo ON 01/29/2022] paliperidone (INVEGA) 24 hr tablet 6 mg  6 mg Oral QHS Massengill, Nathan, MD       traZODone (DESYREL) tablet 100 mg  100 mg Oral QHS Massengill, Nathan, MD       traZODone (DESYREL) tablet 50 mg  50 mg Oral QHS PRN Massengill, Ovid Curd, MD   50 mg at 01/28/22 0121   PTA Medications: Medications Prior to Admission  Medication Sig Dispense Refill Last Dose   amoxicillin-clavulanate (AUGMENTIN) 875-125 MG tablet Take 1 tablet by mouth every 12 (twelve) hours for 5 days. 10 tablet 0    paliperidone (INVEGA SUSTENNA)  156 MG/ML SUSY injection Inject 1 mL (156 mg total) into the muscle every 28 (twenty-eight) days. 1 mL 11 Unknown    Patient Stressors:    Patient Strengths:    Treatment Modalities: Medication Management, Group therapy, Case management,  1 to 1 session with clinician, Psychoeducation, Recreational therapy.   Physician Treatment Plan for Primary Diagnosis: Schizophrenia, unspecified ([place]) Long Term Goal(s): Improvement in symptoms so as ready for discharge   Short Term Goals: Ability to identify changes in lifestyle to reduce recurrence of condition will improve Ability to verbalize feelings will improve Ability to disclose and discuss suicidal ideas Ability to demonstrate self-control will improve Ability to identify and develop effective coping behaviors will improve Ability to maintain clinical measurements within normal limits will improve Compliance with prescribed medications will improve Ability to identify triggers associated with substance abuse/mental health issues will improve  Medication Management: Evaluate patient's response, side effects, and tolerance of medication regimen.  Therapeutic Interventions: 1 to 1 sessions, Unit Group sessions and Medication administration.  Evaluation of Outcomes: Not Met  Physician Treatment Plan for Secondary Diagnosis: Principal Problem:   Schizophrenia, unspecified ([place]) Active Problems:   Tobacco use disorder   Cannabis use disorder  Long Term Goal(s): Improvement in symptoms so as ready for discharge   Short Term Goals: Ability to identify changes in lifestyle to reduce  recurrence of condition will improve Ability to verbalize feelings will improve Ability to disclose and discuss suicidal ideas Ability to demonstrate self-control will improve Ability to identify and develop effective coping behaviors will improve Ability to maintain clinical measurements within normal limits will improve Compliance with prescribed  medications will improve Ability to identify triggers associated with substance abuse/mental health issues will improve     Medication Management: Evaluate patient's response, side effects, and tolerance of medication regimen.  Therapeutic Interventions: 1 to 1 sessions, Unit Group sessions and Medication administration.  Evaluation of Outcomes: Not Met   RN Treatment Plan for Primary Diagnosis: Schizophrenia, unspecified ([place]) Long Term Goal(s): Knowledge of disease and therapeutic regimen to maintain health will improve  Short Term Goals: Ability to remain free from injury will improve, Ability to participate in decision making will improve, Ability to verbalize feelings will improve, Ability to disclose and discuss suicidal ideas, and Ability to identify and develop effective coping behaviors will improve  Medication Management: RN will administer medications as ordered by provider, will assess and evaluate patient's response and provide education to patient for prescribed medication. RN will report any adverse and/or side effects to prescribing provider.  Therapeutic Interventions: 1 on 1 counseling sessions, Psychoeducation, Medication administration, Evaluate responses to treatment, Monitor vital signs and CBGs as ordered, Perform/monitor CIWA, COWS, AIMS and Fall Risk screenings as ordered, Perform wound care treatments as ordered.  Evaluation of Outcomes: Not Met   LCSW Treatment Plan for Primary Diagnosis: Schizophrenia, unspecified ([place]) Long Term Goal(s): Safe transition to appropriate next level of care at discharge, Engage patient in therapeutic group addressing interpersonal concerns.  Short Term Goals: Engage patient in aftercare planning with referrals and resources, Increase social support, Increase emotional regulation, Facilitate acceptance of mental health diagnosis and concerns, Identify triggers associated with mental health/substance abuse issues, and Increase skills  for wellness and recovery  Therapeutic Interventions: Assess for all discharge needs, 1 to 1 time with Social worker, Explore available resources and support systems, Assess for adequacy in community support network, Educate family and significant other(s) on suicide prevention, Complete Psychosocial Assessment, Interpersonal group therapy.  Evaluation of Outcomes: Not Met   Progress in Treatment: Attending groups: Yes. Participating in groups: Yes. Taking medication as prescribed: Yes. Toleration medication: Yes. Family/Significant other contact made: Yes, individual(s) contacted:  Mother  Patient understands diagnosis: Yes. Discussing patient identified problems/goals with staff: Yes. Medical problems stabilized or resolved: Yes. Denies suicidal/homicidal ideation: Yes. Issues/concerns per patient self-inventory: No.   New problem(s) identified: No, Describe:  None   New Short Term/Long Term Goal(s): medication stabilization, elimination of SI thoughts, development of comprehensive mental wellness plan.   Patient Goals: "To stay sober"  Discharge Plan or Barriers: Patient recently admitted. CSW will continue to follow and assess for appropriate referrals and possible discharge planning.   Reason for Continuation of Hospitalization: Anxiety Depression Medication stabilization Suicidal ideation  Estimated Length of Stay: 3 to 7 days   Last 3 [place] Suicide Severity Risk Score: [place] Admission (Current) from 01/27/2022 in [facility] ED to Hosp-Admission (Discharged) from 01/24/2022 in [place] ED from 10/22/2021 in [place] DEPT  C-SSRS RISK CATEGORY No Risk No Risk Moderate Risk       Last PHQ 2/9 Scores:    10/23/2021    6:29 PM 09/01/2021   11:03 AM  Depression screen PHQ 2/9  Decreased Interest 2 2  Down, Depressed, Hopeless 2  2  PHQ - 2 Score 4 4   Altered sleeping 3 0  Tired, decreased energy 2 0  Change in appetite 2 1  Feeling bad or failure about yourself  2 3  Trouble concentrating 2 0  Moving slowly or fidgety/restless 2 2  Suicidal thoughts 0 1  PHQ-9 Score 17 11  Difficult doing work/chores Very difficult Somewhat difficult    Scribe for Treatment Team: Darleen Crocker, Latanya Presser 01/28/2022 2:18 PM

## 2022-01-28 NOTE — Progress Notes (Addendum)
[facility] MD Progress Note  01/28/2022 12:15 PM Jerry Garrison  MRN:  [id number]  Subjective:   Patient is a 21 year old male previously diagnosed with schizophrenia, who was admitted to the psychiatric hospital after overdose on heroin, and concern for suicidal thoughts.  Patient was discharged in February 2023 from this hospital.  He was medically admitted, and treated for aspiration pneumonia after vomiting while obtunded.  He is on Augmentin for this.  Yesterday the psychiatry team made the following recommendations:            -Continue Augmentin for aspiration pneumonia -Start Invega 6 mg p.o. nightly for schizophrenia.   -Start Hinda Glatter Sustenna 234 mg once daily IM LAI on 7-25.  On my exam today, the patient reports that his mood is still depressed.  He reports poor sleep last night due to difficulty initiating sleep.  Reports the trazodone as needed was not helpful.  He denies any side effects to Invega injection or oral Invega medication.  Denies any stiffness, tremors, muscle spasms, or injection site reactions.  He reports that appetite is low.  Patient is still malodorous.  He reports auditory hallucinations are less.  Denies VH AH.  Denies VH.  Denies paranoia.  He reports that suicidal thoughts are less intense and less frequent, and only passive at this time.  Denies any suicide intent or plan.  Denies HI.  He otherwise denies having any side effects to current scheduled psychiatric medications.  We discussed that we will continue be hospitalized until he receives the second dose of the Invega LAI, as he did not follow up to get a second dose of the Invega LAI loading dose series after most recent hospital discharge, which likely contributed to his psychiatric decompensation and decline, and ability to engage in follow-up care.  Principal Problem: Schizophrenia, unspecified (HCC) Diagnosis: Principal Problem:   Schizophrenia, unspecified (HCC) Active Problems:   Tobacco use disorder    Cannabis use disorder  Total Time spent with patient: 20 minutes  Past Psychiatric History:  Schizophrenia Cannabis use disorder Patient reports being hospitalized 2 times in the past, most recently at this hospital in February 2023 History of suicide attempt "I am not sure".  Of note, the patient was admitted to her last hospitalization for psychosis and suicidal thoughts with plan to jump off a parking deck. Past psychiatric medication history: Zyprexa and Invega per chart review.  Patient on exam, is not sure.   Past Medical History:  Past Medical History:  Diagnosis Date   Schizophrenia (HCC)    Substance abuse (HCC)     Past Surgical History:  Procedure Laterality Date   NO PAST SURGERIES     Family History: History reviewed. No pertinent family history.  Family Psychiatric  History:  Denies any psychiatric family history.  Denies any suicide attempts in the family.   Social History:  Social History   Substance and Sexual Activity  Alcohol Use No     Social History   Substance and Sexual Activity  Drug Use Yes   Types: Marijuana, Oxycodone    Social History   Socioeconomic History   Marital status: Single    Spouse name: Not on file   Number of children: Not on file   Years of education: Not on file   Highest education level: Not on file  Occupational History   Not on file  Tobacco Use   Smoking status: Never   Smokeless tobacco: Never  Vaping Use   Vaping Use: Never used  Substance and Sexual Activity   Alcohol use: No   Drug use: Yes    Types: Marijuana, Oxycodone   Sexual activity: Not Currently  Other Topics Concern   Not on file  Social History Narrative   Not on file   Social Determinants of Health   Financial Resource Strain: Not on file  Food Insecurity: Not on file  Transportation Needs: Not on file  Physical Activity: Not on file  Stress: Not on file  Social Connections: Not on file   Additional Social History:                          Sleep: Poor  Appetite:  Poor  Current Medications: Current Facility-Administered Medications  Medication Dose Route Frequency Provider Last Rate Last Admin   acetaminophen (TYLENOL) tablet 650 mg  650 mg Oral Q6H PRN Onuoha, Chinwendu V, NP   650 mg at 01/28/22 1038   alum & mag hydroxide-simeth (MAALOX/MYLANTA) 200-200-20 MG/5ML suspension 30 mL  30 mL Oral Q4H PRN Onuoha, Chinwendu V, NP       amoxicillin-clavulanate (AUGMENTIN) 875-125 MG per tablet 1 tablet  1 tablet Oral Q12H Arianni Gallego, Harrold DonathNathan, MD   1 tablet at 01/28/22 1036   hydrOXYzine (ATARAX) tablet 25 mg  25 mg Oral TID PRN Phineas InchesMassengill, Macaiah Mangal, MD   25 mg at 01/28/22 1039   magnesium hydroxide (MILK OF MAGNESIA) suspension 30 mL  30 mL Oral Daily PRN Onuoha, Chinwendu V, NP       nicotine (NICODERM CQ - dosed in mg/24 hours) patch 14 mg  14 mg Transdermal Daily Marvin Maenza, Harrold DonathNathan, MD   14 mg at 01/28/22 1037   OLANZapine zydis (ZYPREXA) disintegrating tablet 5 mg  5 mg Oral Q8H PRN Kirah Stice, Harrold DonathNathan, MD       And   ziprasidone (GEODON) injection 20 mg  20 mg Intramuscular Q8H PRN Debrina Kizer, Harrold DonathNathan, MD       Melene Muller[START ON 01/29/2022] paliperidone (INVEGA) 24 hr tablet 6 mg  6 mg Oral QHS Kamaron Deskins, MD       traZODone (DESYREL) tablet 100 mg  100 mg Oral QHS Ronan Dion, MD       traZODone (DESYREL) tablet 50 mg  50 mg Oral QHS PRN Ceira Hoeschen, Harrold DonathNathan, MD   50 mg at 01/28/22 0121    Lab Results:  Results for orders placed or performed during the hospital encounter of 01/24/22 (from the past 48 hour(s))  SARS Coronavirus 2 by RT PCR (hospital order, performed in [facility] lab) *cepheid single result test* Anterior Nasal Swab     Status: None   Collection Time: 01/26/22  3:10 PM   Specimen: Anterior Nasal Swab  Result Value Ref Range   SARS Coronavirus 2 by RT PCR NEGATIVE NEGATIVE    Comment: (NOTE) SARS-CoV-2 target nucleic acids are NOT DETECTED.  The SARS-CoV-2 RNA is generally  detectable in upper and lower respiratory specimens during the acute phase of infection. The lowest concentration of SARS-CoV-2 viral copies this assay can detect is 250 copies / mL. A negative result does not preclude SARS-CoV-2 infection and should not be used as the sole basis for treatment or other patient management decisions.  A negative result may occur with improper specimen collection / handling, submission of specimen other than nasopharyngeal swab, presence of viral mutation(s) within the areas targeted by this assay, and inadequate number of viral copies (<250 copies / mL). A negative result must be combined with clinical  observations, patient history, and epidemiological information.  Fact Sheet for Patients:   RoadLapTop.co.za  Fact Sheet for Healthcare Providers: http://kim-miller.com/  This test is not yet approved or  cleared by the [place] FDA and has been authorized for detection and/or diagnosis of SARS-CoV-2 by FDA under an Emergency Use Authorization (EUA).  This EUA will remain in effect (meaning this test can be used) for the duration of the COVID-19 declaration under Section 564(b)(1) of the Act, 21 U.S.C. section 360bbb-3(b)(1), unless the authorization is terminated or revoked sooner.  Performed at [facility], [address] [place], [place] [address]     Blood Alcohol level:  Lab Results  Component Value Date   [facility] <10 01/24/2022   ETH <10 10/22/2021    Metabolic Disorder Labs: Lab Results  Component Value Date   HGBA1C 4.4 (L) 08/07/2021   MPG 79.58 08/07/2021   No results found for: "PROLACTIN" Lab Results  Component Value Date   CHOL 135 08/07/2021   TRIG 26 08/07/2021   HDL 52 08/07/2021   CHOLHDL 2.6 08/07/2021   VLDL 5 08/07/2021   LDLCALC 78 08/07/2021    Physical Findings: AIMS:  , ,  ,  ,    CIWA:    COWS:     Musculoskeletal: Strength & Muscle  Tone: within normal limits Gait & Station: normal Patient leans: N/A  Psychiatric Specialty Exam:  Presentation  General Appearance: Disheveled (odorous)  Eye Contact:Poor  Speech:Slow  Speech Volume:Decreased  Handedness:Right   Mood and Affect  Mood:Anxious; Dysphoric  Affect:Flat   Thought Process  Thought Processes:Linear  Descriptions of Associations:Intact  Orientation:Partial  Thought Content:Logical  History of Schizophrenia/Schizoaffective disorder:Yes  Duration of Psychotic Symptoms:Greater than six months  Hallucinations:Hallucinations: Auditory  Ideas of Reference:None  Suicidal Thoughts:Suicidal Thoughts: Yes, Passive SI Passive Intent and/or Plan: Without Intent; Without Plan  Homicidal Thoughts:Homicidal Thoughts: No   Sensorium  Memory:Immediate Fair; Recent Fair; Remote Fair  Judgment:Impaired  Insight:Lacking   Executive Functions  Concentration:Poor  Attention Span:Poor  Recall:Fair  Fund of Knowledge:Fair  Language:Fair   Psychomotor Activity  Psychomotor Activity:Psychomotor Activity: Normal   Assets  Assets:Communication Skills; Housing; Physical Health   Sleep  Sleep:Sleep: Fair    Physical Exam: Physical Exam Vitals reviewed.  Pulmonary:     Effort: Pulmonary effort is normal.  Neurological:     Mental Status: He is alert.    Review of Systems  Psychiatric/Behavioral:  Positive for depression, hallucinations and suicidal ideas. The patient is nervous/anxious and has insomnia.    Blood pressure 130/87, pulse 69, temperature 98.6 F (37 C), temperature source Oral, resp. rate 18, height 5\' 10"  (1.778 m), weight 65.2 kg, SpO2 98 %. Body mass index is 20.63 kg/m.   Treatment Plan Summary: Daily contact with patient to assess and evaluate symptoms and progress in treatment   ASSESSMENT:   Diagnoses / Active Problems:   -Schizophrenia, unspecified type -Cannabis use disorder -Tobacco use  disorder -Rule out opioid use disorder   PLAN: Safety and Monitoring:             -- Involuntary admission to inpatient psychiatric unit for safety, stabilization and treatment             -- Daily contact with patient to assess and evaluate symptoms and progress in treatment             -- Patient's case to be discussed in multi-disciplinary team meeting             --  Observation Level : q15 minute checks             -- Vital signs:  q12 hours             -- Precautions: suicide, elopement, and assault   2. Psychiatric Diagnoses and Treatment:               -Continue Augmentin for aspiration pneumonia -Change Invega 6 mg p.o. once daily to once at bedtime - for schizophrenia.  Patient previously tolerated Invega, without any reported side effects. -Previously administered Invega Sustenna 234 mg once daily IM LAI on 7-25. Next dose of invega sustenna is due in 4-7 days.   For schizophrenia. We will continue p.o. Invega until the patient receives the second [place] loading dose injection. QTc is 444 -Start trazodone 100 mg qhs for depression and insomnia -Continue Trazodone 50 mg qhs prn for depression and insomnia  -We will continue to monitor for depressive symptoms, and start antidepressant if necessary.  We will first treat the psychotic disorder.   --  The risks/benefits/side-effects/alternatives to this medication were discussed in detail with the patient and time was given for questions. The patient consents to medication trial.                -- Metabolic profile and EKG monitoring obtained while on an atypical antipsychotic (BMI: Lipid Panel: HbgA1c: QTc:) 444             -- Encouraged patient to participate in unit milieu and in scheduled group therapies              -- Short Term Goals: Ability to identify changes in lifestyle to reduce recurrence of condition will improve, Ability to verbalize feelings will improve, Ability to disclose and discuss suicidal ideas, Ability  to demonstrate self-control will improve, Ability to identify and develop effective coping behaviors will improve, Ability to maintain clinical measurements within normal limits will improve, Compliance with prescribed medications will improve, and Ability to identify triggers associated with substance abuse/mental health issues will improve             -- Long Term Goals: Improvement in symptoms so as ready for discharge                3. Medical Issues Being Addressed:              Tobacco Use Disorder             -- Nicotine patch 21mg /24 hours ordered             -- Smoking cessation encouraged   4. Discharge Planning:              -- Social work and case management to assist with discharge planning and identification of hospital follow-up needs prior to discharge             -- Estimated LOS: 5-7 days             -- Discharge Concerns: Need to establish a safety plan; Medication compliance and effectiveness             -- Discharge Goals: Return home with outpatient referrals for mental health follow-up including medication management/psychotherapy       12-23-1985, MD 01/28/2022, 12:15 PM  Total Time Spent in Direct Patient Care:  I personally spent 35 minutes on the unit in direct patient care. The direct patient care time included face-to-face time with the patient, reviewing  the patient's chart, communicating with other professionals, and coordinating care. Greater than 50% of this time was spent in counseling or coordinating care with the patient regarding goals of hospitalization, psycho-education, and discharge planning needs.   Phineas Inches, MD Psychiatrist

## 2022-01-28 NOTE — Group Note (Signed)
LCSW Group Therapy Note   Group Date: 01/28/2022 Start Time: 1300 End Time: 1400  Type of Therapy and Topic: Group Packets; Self-Esteem   Participation Level:  Active  Due to the acuity and complex discharge plans, group was not held. Patient was provided therapeutic worksheets and asked to meet with CSW as needed.  Aram Beecham, LCSWA 01/28/2022  1:23 PM

## 2022-01-29 MED ORDER — ESCITALOPRAM OXALATE 5 MG PO TABS
5.0000 mg | ORAL_TABLET | Freq: Every day | ORAL | Status: DC
  Administered 2022-01-29 – 2022-01-31 (×3): 5 mg via ORAL
  Filled 2022-01-29 (×7): qty 1

## 2022-01-29 NOTE — Progress Notes (Signed)
The patient rated his day as a 7 out of 10 since he was able to sit thru a movie. He learned today that he should try to leave his blister alone.

## 2022-01-29 NOTE — [facility] Counselor (Signed)
CSW provided the Pt with a packet that contains information including shelter and housing resources, free and reduced price food information, clothing resources, crisis center information, a Good[employer] card, and suicide prevention information.   

## 2022-01-29 NOTE — Progress Notes (Signed)
[facility] MD Progress Note  01/29/2022 3:31 PM Jerry Garrison  MRN:  [id number]  Subjective:   Patient is a 21 year old male previously diagnosed with schizophrenia, who was admitted to the psychiatric hospital after overdose on heroin, and concern for suicidal thoughts.  Patient was discharged in February 2023 from this hospital.  He was medically admitted, and treated for aspiration pneumonia after vomiting while obtunded.  He is on Augmentin for this.  Yesterday the psychiatry team made the following recommendations:         -Continue Augmentin for aspiration pneumonia -Change Invega 6 mg p.o. once daily to once at bedtime - for schizophrenia. -Previously administered [place] 234 mg once daily IM LAI on 7-25.  -Start trazodone 100 mg qhs for depression and insomnia -Continue Trazodone 50 mg qhs prn for depression and insomnia    On my exam today, the patient reports that his mood is still depressed.  He reports better sleep last night since increasing trazodone.  He denies any side effects to Invega injection or oral Invega medication.  Denies any stiffness, tremors, muscle spasms, or injection site reactions.  He reports that appetite is better.  He reports auditory hallucinations are less.  Denies VH. Denies paranoia.  He reports that suicidal thoughts have ceased, which is an improvement.  Denies any suicide intent or plan.  Denies HI.  We discussed that we will continue be hospitalized until he receives the second dose of the Invega LAI, as he did not follow up to get a second dose of the Invega LAI loading dose series after most recent hospital discharge, which likely contributed to his psychiatric decompensation and decline, and ability to engage in follow-up care. Pt is agreeable to start medication for depression.   Principal Problem: Schizophrenia, unspecified (HCC) Diagnosis: Principal Problem:   Schizophrenia, unspecified (HCC) Active Problems:   Tobacco use disorder   Cannabis  use disorder  Total Time spent with patient: 20 minutes  Past Psychiatric History:  Schizophrenia Cannabis use disorder Patient reports being hospitalized 2 times in the past, most recently at this hospital in February 2023 History of suicide attempt "I am not sure".  Of note, the patient was admitted to her last hospitalization for psychosis and suicidal thoughts with plan to jump off a parking deck. Past psychiatric medication history: Zyprexa and Invega per chart review.  Patient on exam, is not sure.   Past Medical History:  Past Medical History:  Diagnosis Date   Schizophrenia (HCC)    Substance abuse (HCC)     Past Surgical History:  Procedure Laterality Date   NO PAST SURGERIES     Family History: History reviewed. No pertinent family history.  Family Psychiatric  History:  Denies any psychiatric family history.  Denies any suicide attempts in the family.   Social History:  Social History   Substance and Sexual Activity  Alcohol Use No     Social History   Substance and Sexual Activity  Drug Use Yes   Types: Marijuana, Oxycodone    Social History   Socioeconomic History   Marital status: Single    Spouse name: Not on file   Number of children: Not on file   Years of education: Not on file   Highest education level: Not on file  Occupational History   Not on file  Tobacco Use   Smoking status: Never   Smokeless tobacco: Never  Vaping Use   Vaping Use: Never used  Substance and Sexual Activity  Alcohol use: No   Drug use: Yes    Types: Marijuana, Oxycodone   Sexual activity: Not Currently  Other Topics Concern   Not on file  Social History Narrative   Not on file   Social Determinants of Health   Financial Resource Strain: Not on file  Food Insecurity: Not on file  Transportation Needs: Not on file  Physical Activity: Not on file  Stress: Not on file  Social Connections: Not on file   Additional Social History:                          Sleep: better  Appetite:  better  Current Medications: Current Facility-Administered Medications  Medication Dose Route Frequency Provider Last Rate Last Admin   acetaminophen (TYLENOL) tablet 650 mg  650 mg Oral Q6H PRN Onuoha, Chinwendu V, NP   650 mg at 01/28/22 1038   alum & mag hydroxide-simeth (MAALOX/MYLANTA) 200-200-20 MG/5ML suspension 30 mL  30 mL Oral Q4H PRN Onuoha, Chinwendu V, NP       amoxicillin-clavulanate (AUGMENTIN) 875-125 MG per tablet 1 tablet  1 tablet Oral Q12H Praise Dolecki, Harrold Donath, MD   1 tablet at 01/29/22 1132   escitalopram (LEXAPRO) tablet 5 mg  5 mg Oral Daily Jennise Both, Harrold Donath, MD       hydrOXYzine (ATARAX) tablet 25 mg  25 mg Oral TID PRN Phineas Inches, MD   25 mg at 01/28/22 1039   magnesium hydroxide (MILK OF MAGNESIA) suspension 30 mL  30 mL Oral Daily PRN Onuoha, Chinwendu V, NP       nicotine (NICODERM CQ - dosed in mg/24 hours) patch 14 mg  14 mg Transdermal Daily Tyshia Fenter, Harrold Donath, MD   14 mg at 01/29/22 1132   OLANZapine zydis (ZYPREXA) disintegrating tablet 5 mg  5 mg Oral Q8H PRN Bonnell Placzek, Harrold Donath, MD       And   ziprasidone (GEODON) injection 20 mg  20 mg Intramuscular Q8H PRN Abbiegail Landgren, MD       paliperidone (INVEGA) 24 hr tablet 6 mg  6 mg Oral QHS Ryanne Morand, MD       traZODone (DESYREL) tablet 100 mg  100 mg Oral QHS Raeshawn Tafolla, Harrold Donath, MD   100 mg at 01/28/22 2125   traZODone (DESYREL) tablet 50 mg  50 mg Oral QHS PRN Phineas Inches, MD   50 mg at 01/28/22 0121    Lab Results:  No results found for this or any previous visit (from the past 48 hour(s)).   Blood Alcohol level:  Lab Results  Component Value Date   ETH <10 01/24/2022   ETH <10 10/22/2021    Metabolic Disorder Labs: Lab Results  Component Value Date   HGBA1C 4.4 (L) 08/07/2021   MPG 79.58 08/07/2021   No results found for: "PROLACTIN" Lab Results  Component Value Date   CHOL 135 08/07/2021   TRIG 26 08/07/2021   HDL 52  08/07/2021   CHOLHDL 2.6 08/07/2021   VLDL 5 08/07/2021   LDLCALC 78 08/07/2021    Physical Findings: AIMS:  , ,  ,  ,    CIWA:    COWS:     Musculoskeletal: Strength & Muscle Tone: within normal limits Gait & Station: normal Patient leans: N/A  Psychiatric Specialty Exam:  Presentation  General Appearance: casual Eye Contact:better  Speech:nml  Speech Volume:nml  Handedness:Right   Mood and Affect  Mood:Anxious; Dysphoric Depressed  Affect:Flat   Thought Process  Thought Processes:Linear  Descriptions of Associations:Intact  Orientation:full  Thought Content:Logical  History of Schizophrenia/Schizoaffective disorder:Yes  Duration of Psychotic Symptoms:Greater than six months  Hallucinations:No data recorded Reports AH are less. Denies VH  Ideas of Reference:None  Suicidal Thoughts:No data recorded Denies  Homicidal Thoughts:No data recorded Denies   Sensorium  Memory:Immediate Fair; Recent Fair; Remote Fair  Judgment:Impaired  Insight:Lacking   Executive Functions  Concentration:Poor  Attention Span:Poor  Recall:Fair  Fund of Knowledge:Fair  Language:Fair   Psychomotor Activity  Psychomotor Activity:No data recorded   Assets  Assets:Communication Skills; Housing; Physical Health   Sleep  Sleep:No data recorded better   Physical Exam: Physical Exam Vitals reviewed.  Pulmonary:     Effort: Pulmonary effort is normal.  Neurological:     Mental Status: He is alert.     Motor: No weakness.     Gait: Gait normal.    Review of Systems  Psychiatric/Behavioral:  Positive for depression, hallucinations and substance abuse. Negative for suicidal ideas. The patient is nervous/anxious. The patient does not have insomnia.    Blood pressure 126/76, pulse 65, temperature 97.9 F (36.6 C), temperature source Oral, resp. rate 18, height 5\' 10"  (1.778 m), weight 65.2 kg, SpO2 95 %. Body mass index is 20.63  kg/m.   Treatment Plan Summary: Daily contact with patient to assess and evaluate symptoms and progress in treatment   ASSESSMENT:   Diagnoses / Active Problems:   -Schizophrenia, unspecified type -MDD, moderate, recurrent  -Cannabis use disorder -Tobacco use disorder -Rule out opioid use disorder   PLAN: Safety and Monitoring:             -- Involuntary admission to inpatient psychiatric unit for safety, stabilization and treatment             -- Daily contact with patient to assess and evaluate symptoms and progress in treatment             -- Patient's case to be discussed in multi-disciplinary team meeting             -- Observation Level : q15 minute checks             -- Vital signs:  q12 hours             -- Precautions: suicide, elopement, and assault   2. Psychiatric Diagnoses and Treatment:               -Continue Augmentin for aspiration pneumonia -Continue Invega 6 mg at bedtime - for schizophrenia.   -Previously administered Invega [place] 234 mg once daily IM LAI on 7-25. Next dose of invega sustenna is due in 4-7 days.   For schizophrenia. We will continue p.o. Invega until the patient receives the second [birth year]-1980 loading dose injection. QTc is 444 -Start lexapro 5 mg once daily for MDD -Continue trazodone 100 mg qhs for depression and insomnia -Continue Trazodone 50 mg qhs prn for depression and insomnia     --  The risks/benefits/side-effects/alternatives to this medication were discussed in detail with the patient and time was given for questions. The patient consents to medication trial.                -- Metabolic profile and EKG monitoring obtained while on an atypical antipsychotic (BMI: Lipid Panel: HbgA1c: QTc:) 444             -- Encouraged patient to participate in unit milieu and in scheduled group therapies              --  Short Term Goals: Ability to identify changes in lifestyle to reduce recurrence of condition will improve, Ability to  verbalize feelings will improve, Ability to disclose and discuss suicidal ideas, Ability to demonstrate self-control will improve, Ability to identify and develop effective coping behaviors will improve, Ability to maintain clinical measurements within normal limits will improve, Compliance with prescribed medications will improve, and Ability to identify triggers associated with substance abuse/mental health issues will improve             -- Long Term Goals: Improvement in symptoms so as ready for discharge                3. Medical Issues Being Addressed:              Tobacco Use Disorder             -- Nicotine patch 21mg /24 hours ordered             -- Smoking cessation encouraged   4. Discharge Planning:              -- Social work and case management to assist with discharge planning and identification of hospital follow-up needs prior to discharge             -- Estimated LOS: 5-7 days             -- Discharge Concerns: Need to establish a safety plan; Medication compliance and effectiveness             -- Discharge Goals: Return home with outpatient referrals for mental health follow-up including medication management/psychotherapy       12-23-1985, MD 01/29/2022, 3:31 PM  Total Time Spent in Direct Patient Care:  I personally spent 35 minutes on the unit in direct patient care. The direct patient care time included face-to-face time with the patient, reviewing the patient's chart, communicating with other professionals, and coordinating care. Greater than 50% of this time was spent in counseling or coordinating care with the patient regarding goals of hospitalization, psycho-education, and discharge planning needs.   01/31/2022, MD Psychiatrist

## 2022-01-29 NOTE — [facility] Suicide Risk Assessment (Signed)
[facility] INPATIENT:  Family/Significant Other Suicide Prevention Education  Suicide Prevention Education:  Education Completed; Demoni Gergen,  [phone] (name of family member/significant other) has been identified by the patient as the family member/significant other with whom the patient will be residing, and identified as the person(s) who will aid the patient in the event of a mental health crisis (suicidal ideations/suicide attempt).  With written consent from the patient, the family member/significant other has been provided the following suicide prevention education, prior to the and/or following the discharge of the patient.  Pt mother reports that patient has had a hard couple of years after getting a girl pregnant at 17 and then the girl "ghosting" him and moving to [place] with son.  Patient then resorted to drugs and was diagnosed with schizophrenia.  He was seeing someone at [facility] but stopped going.  She would like to get him into [facility] but understands that it is up to him.  Mother reports that she feels like he wants the help. Mother reports that he has no access to guns/weapons but the situation in the tent is not a good situation for him.   The suicide prevention education provided includes the following: Suicide risk factors Suicide prevention and interventions National Suicide Hotline telephone number [facility] assessment telephone number [facility] Emergency Assistance 911 [facility] and/or Residential Mobile Crisis Unit telephone number  Request made of family/significant other to: Remove weapons (e.g., guns, rifles, knives), all items previously/currently identified as safety concern.   Remove drugs/medications (over-the-counter, prescriptions, illicit drugs), all items previously/currently identified as a safety concern.  The family member/significant other verbalizes understanding of the suicide prevention  education information provided.  The family member/significant other agrees to remove the items of safety concern listed above.  Eugine Bubb E Nimrit Kehres 01/29/2022, 11:40 AM

## 2022-01-30 MED ORDER — PALIPERIDONE PALMITATE ER 156 MG/ML IM SUSY
156.0000 mg | PREFILLED_SYRINGE | INTRAMUSCULAR | Status: DC
  Administered 2022-01-31: 156 mg via INTRAMUSCULAR
  Filled 2022-01-30: qty 1

## 2022-01-30 MED ORDER — PALIPERIDONE PALMITATE ER 156 MG/ML IM SUSY: 156.0000 mg | PREFILLED_SYRINGE | INTRAMUSCULAR | Status: DC

## 2022-01-30 MED ORDER — PALIPERIDONE ER 6 MG PO TB24
6.0000 mg | ORAL_TABLET | Freq: Every day | ORAL | Status: AC
  Administered 2022-01-30: 6 mg via ORAL
  Filled 2022-01-30: qty 1

## 2022-01-30 NOTE — [facility] Counselor (Signed)
CSW provided patient with information regarding insurance and qualifying for substance use rehab.  Patient demonstrated understanding and stated that he would like to stay in the tent when he is discharged.    Loza Prell, LCSW, LCAS Clincal Social Worker  [facility]

## 2022-01-30 NOTE — Plan of Care (Signed)
Patient is med compliant and stated he slept well last night. Patient denies any pain. Patient denies SI, HI, and A/V/H with no plan/intent. Patient noted to socialize appropriately and verbalized no questions/concerns thus far.    Problem: Activity: Goal: Sleeping patterns will improve Outcome: Progressing   Problem: Health Behavior/Discharge Planning: Goal: Compliance with treatment plan for underlying cause of condition will improve Outcome: Progressing   Problem: Safety: Goal: Periods of time without injury will increase Outcome: Progressing

## 2022-01-30 NOTE — Progress Notes (Signed)
Patient compliant with mediations and treatment plan denies SI/HI/A/VH and verbally contracted for safety. Support and encouragement provided.

## 2022-01-30 NOTE — Progress Notes (Signed)
Pt did not attend the AA group. 

## 2022-01-30 NOTE — Group Note (Signed)
Recreation Therapy Group Note   Group Topic:Team Building  Group Date: 01/30/2022 Start Time: 0930 End Time: 0955 Facilitators: Caroll Rancher, LRT,CTRS Location: [address]   Recreation Therapy Notes  Goal Area(s) Addresses:  Patient will effectively work with peer towards shared goal.  Patient will identify skills used to make activity successful.  Patient will identify how skills used during activity can be applied to reach post d/c goals.   Group Description:  [employer]. In teams of 5-6, patients were given 11 craft pipe cleaners. Using the materials provided, patients were instructed to compete again the opposing team(s) to build the tallest free-standing structure from floor level. The activity was timed; difficulty increased by Clinical research associate as Production designer, theatre/television/film continued.  Systematically resources were removed with additional directions for example, placing one arm behind their back, working in silence, and shape stipulations. LRT facilitated post-activity discussion reviewing team processes and necessary communication skills involved in completion. Patients were encouraged to reflect how the skills utilized, or not utilized, in this activity can be incorporated to positively impact support systems post discharge.   Affect/Mood: Appropriate   Participation Level: Engaged   Participation Quality: Independent   Behavior: Appropriate   Speech/Thought Process: Focused   Insight: Good   Judgement: Good   Modes of Intervention: STEM Activity   Patient Response to Interventions:  Engaged   Education Outcome:  Acknowledges education and In group clarification offered    Clinical Observations/Individualized Feedback: Pt attended and participated in group.    Plan: Continue to engage patient in RT group sessions 2-3x/week.   Caroll Rancher, LRT,CTRS 01/30/2022 1:07 PM

## 2022-01-30 NOTE — Progress Notes (Addendum)
[facility] MD Progress Note  01/30/2022 9:30 AM Jerry Garrison  MRN:  [id number]  Subjective:   Patient is a 21 year old male previously diagnosed with schizophrenia, who was admitted to the psychiatric hospital after overdose on heroin, and concern for suicidal thoughts.  Patient was discharged in February 2023 from this hospital.  He was medically admitted, and treated for aspiration pneumonia after vomiting while obtunded.  He is on Augmentin for this.  Yesterday the psychiatry team made the following recommendations:         -Continue Augmentin for aspiration pneumonia -Change Invega 6 mg p.o. once daily to once at bedtime - for schizophrenia. -Previously administered [place] 234 mg once daily IM LAI on 7-25.  -Continue trazodone 100 mg qhs for depression and insomnia -Continue Trazodone 50 mg qhs prn for depression and insomnia    On my exam today, the patient reports that his mood is much improved.  He reports better sleep last night since increasing trazodone.  He denies any side effects to Invega injection or oral Invega medication.  Denies any stiffness, tremors, muscle spasms, or injection site reactions.  He reports that appetite is better.  He reports auditory hallucinations have stopped completely.  Denies VH. Denies paranoia.  He denies having suicidal thoughts.   Denies any suicide intent or plan.  Denies HI.   We discussed that we will continue be hospitalized until he receives the second dose of the Invega LAI (Saturday).      Principal Problem: Schizophrenia, unspecified (HCC) Diagnosis: Principal Problem:   Schizophrenia, unspecified (HCC) Active Problems:   Tobacco use disorder   Cannabis use disorder  Total Time spent with patient: 20 minutes  Past Psychiatric History:  Schizophrenia Cannabis use disorder Patient reports being hospitalized 2 times in the past, most recently at this hospital in February 2023 History of suicide attempt "I am not sure".  Of note,  the patient was admitted to her last hospitalization for psychosis and suicidal thoughts with plan to jump off a parking deck. Past psychiatric medication history: Zyprexa and Invega per chart review.  Patient on exam, is not sure.   Past Medical History:  Past Medical History:  Diagnosis Date   Schizophrenia (HCC)    Substance abuse (HCC)     Past Surgical History:  Procedure Laterality Date   NO PAST SURGERIES     Family History: History reviewed. No pertinent family history.  Family Psychiatric  History:  Denies any psychiatric family history.  Denies any suicide attempts in the family.   Social History:  Social History   Substance and Sexual Activity  Alcohol Use No     Social History   Substance and Sexual Activity  Drug Use Yes   Types: Marijuana, Oxycodone    Social History   Socioeconomic History   Marital status: Single    Spouse name: Not on file   Number of children: Not on file   Years of education: Not on file   Highest education level: Not on file  Occupational History   Not on file  Tobacco Use   Smoking status: Never   Smokeless tobacco: Never  Vaping Use   Vaping Use: Never used  Substance and Sexual Activity   Alcohol use: No   Drug use: Yes    Types: Marijuana, Oxycodone   Sexual activity: Not Currently  Other Topics Concern   Not on file  Social History Narrative   Not on file   Social Determinants of Health  Financial Resource Strain: Not on file  Food Insecurity: Not on file  Transportation Needs: Not on file  Physical Activity: Not on file  Stress: Not on file  Social Connections: Not on file   Additional Social History:                         Sleep: better  Appetite:  better  Current Medications: Current Facility-Administered Medications  Medication Dose Route Frequency Provider Last Rate Last Admin   acetaminophen (TYLENOL) tablet 650 mg  650 mg Oral Q6H PRN Onuoha, Chinwendu V, NP   650 mg at 01/28/22  1038   alum & mag hydroxide-simeth (MAALOX/MYLANTA) 200-200-20 MG/5ML suspension 30 mL  30 mL Oral Q4H PRN Onuoha, Chinwendu V, NP       amoxicillin-clavulanate (AUGMENTIN) 875-125 MG per tablet 1 tablet  1 tablet Oral Q12H Tameisha Covell, Harrold Donath, MD   1 tablet at 01/30/22 0928   escitalopram (LEXAPRO) tablet 5 mg  5 mg Oral Daily Madisynn Plair, Harrold Donath, MD   5 mg at 01/30/22 5573   hydrOXYzine (ATARAX) tablet 25 mg  25 mg Oral TID PRN Phineas Inches, MD   25 mg at 01/28/22 1039   magnesium hydroxide (MILK OF MAGNESIA) suspension 30 mL  30 mL Oral Daily PRN Onuoha, Chinwendu V, NP       nicotine (NICODERM CQ - dosed in mg/24 hours) patch 14 mg  14 mg Transdermal Daily Salaya Holtrop, MD   14 mg at 01/30/22 0926   OLANZapine zydis (ZYPREXA) disintegrating tablet 5 mg  5 mg Oral Q8H PRN Brittny Spangle, Harrold Donath, MD       And   ziprasidone (GEODON) injection 20 mg  20 mg Intramuscular Q8H PRN Jozalyn Baglio, MD       paliperidone (INVEGA) 24 hr tablet 6 mg  6 mg Oral QHS Sacheen Arrasmith, MD   6 mg at 01/29/22 2115   traZODone (DESYREL) tablet 100 mg  100 mg Oral QHS Levell Tavano, Harrold Donath, MD   100 mg at 01/29/22 2115   traZODone (DESYREL) tablet 50 mg  50 mg Oral QHS PRN Phineas Inches, MD   50 mg at 01/28/22 0121    Lab Results:  No results found for this or any previous visit (from the past 48 hour(s)).   Blood Alcohol level:  Lab Results  Component Value Date   ETH <10 01/24/2022   ETH <10 10/22/2021    Metabolic Disorder Labs: Lab Results  Component Value Date   HGBA1C 4.4 (L) 08/07/2021   MPG 79.58 08/07/2021   No results found for: "PROLACTIN" Lab Results  Component Value Date   CHOL 135 08/07/2021   TRIG 26 08/07/2021   HDL 52 08/07/2021   CHOLHDL 2.6 08/07/2021   VLDL 5 08/07/2021   LDLCALC 78 08/07/2021    Physical Findings: AIMS:  , ,  ,  ,    CIWA:    COWS:     Musculoskeletal: Strength & Muscle Tone: within normal limits Gait & Station: normal Patient  leans: N/A  Psychiatric Specialty Exam:  Presentation  General Appearance: casual Eye Contact:better  Speech:nml  Speech Volume:nml  Handedness:Right   Mood and Affect  Mood:euthymic   Affect:full range   Thought Process  Thought Processes:Linear  Descriptions of Associations:Intact  Orientation:full  Thought Content:Logical  History of Schizophrenia/Schizoaffective disorder:Yes  Duration of Psychotic Symptoms:Greater than six months  Hallucinations:No data recorded Denies AH. Denies VH  Ideas of Reference:None  Suicidal Thoughts:No data recorded Denies  Homicidal Thoughts:No data recorded Denies   Sensorium  Memory:Immediate Fair; Recent Fair; Remote Fair  Judgment:improving  Insight:improving   Designer, jewellery  Attention Span:better  Recall:Fair  [employer] of Knowledge:Fair  Language:Fair   Psychomotor Activity  Psychomotor Activity:No data recorded   Assets  Assets:Communication Skills; Housing; Physical Health   Sleep  Sleep:No data recorded better   Physical Exam: Physical Exam Vitals reviewed.  Pulmonary:     Effort: Pulmonary effort is normal.  Neurological:     Mental Status: He is alert.     Motor: No weakness.     Gait: Gait normal.    Review of Systems  Psychiatric/Behavioral:  Positive for depression, hallucinations and substance abuse. Negative for suicidal ideas. The patient is nervous/anxious. The patient does not have insomnia.    Blood pressure 109/63, pulse 94, temperature 98.4 F (36.9 C), temperature source Oral, resp. rate 18, height 5\' 10"  (1.778 m), weight 65.2 kg, SpO2 98 %. Body mass index is 20.63 kg/m.   Treatment Plan Summary: Daily contact with patient to assess and evaluate symptoms and progress in treatment   ASSESSMENT:   Diagnoses / Active Problems:   -Schizophrenia, unspecified type -MDD, moderate, recurrent  -Cannabis use disorder -Tobacco use  disorder -Rule out opioid use disorder   PLAN: Safety and Monitoring:             -- Involuntary admission to inpatient psychiatric unit for safety, stabilization and treatment             -- Daily contact with patient to assess and evaluate symptoms and progress in treatment             -- Patient's case to be discussed in multi-disciplinary team meeting             -- Observation Level : q15 minute checks             -- Vital signs:  q12 hours             -- Precautions: suicide, elopement, and assault   2. Psychiatric Diagnoses and Treatment:               -Continue Augmentin for aspiration pneumonia -Continue Invega 6 mg at bedtime - for schizophrenia.  (last dose today) -Previously administered Invega [place] 234 mg once daily IM LAI on 7-25. Next dose of invega sustenna is due in 4-7 days.   For schizophrenia. We will continue p.o. Invega until the patient receives the second 10-13-1982 loading dose injection. QTc is 444 -Order Invega stustenna LAI 156 mg once q28 days - administer tomorrow 7-29. -Continue lexapro 5 mg once daily for MDD -Continue trazodone 100 mg qhs for depression and insomnia -Continue Trazodone 50 mg qhs prn for depression and insomnia     --  The risks/benefits/side-effects/alternatives to this medication were discussed in detail with the patient and time was given for questions. The patient consents to medication trial.                -- Metabolic profile and EKG monitoring obtained while on an atypical antipsychotic (BMI: Lipid Panel: HbgA1c: QTc:) 444             -- Encouraged patient to participate in unit milieu and in scheduled group therapies              -- Short Term Goals: Ability to identify changes in lifestyle to reduce recurrence of condition will improve, Ability to verbalize feelings will  improve, Ability to disclose and discuss suicidal ideas, Ability to demonstrate self-control will improve, Ability to identify and develop effective  coping behaviors will improve, Ability to maintain clinical measurements within normal limits will improve, Compliance with prescribed medications will improve, and Ability to identify triggers associated with substance abuse/mental health issues will improve             -- Long Term Goals: Improvement in symptoms so as ready for discharge                3. Medical Issues Being Addressed:              Tobacco Use Disorder             -- Nicotine patch 21mg /24 hours ordered             -- Smoking cessation encouraged   4. Discharge Planning:              -- Social work and case management to assist with discharge planning and identification of hospital follow-up needs prior to discharge             -- Estimated LOS: 5-7 days             -- Discharge Concerns: Need to establish a safety plan; Medication compliance and effectiveness             -- Discharge Goals: Return home with outpatient referrals for mental health follow-up including medication management/psychotherapy       12-23-1985, MD 01/30/2022, 9:30 AM  Total Time Spent in Direct Patient Care:  I personally spent 25 minutes on the unit in direct patient care. The direct patient care time included face-to-face time with the patient, reviewing the patient's chart, communicating with other professionals, and coordinating care. Greater than 50% of this time was spent in counseling or coordinating care with the patient regarding goals of hospitalization, psycho-education, and discharge planning needs.   02/01/2022, MD Psychiatrist

## 2022-01-30 NOTE — Progress Notes (Signed)
   01/30/22 2358  Psych Admission Type (Psych Patients Only)  Admission Status Involuntary  Psychosocial Assessment  Patient Complaints None  Eye Contact Brief  Facial Expression Animated  Affect Appropriate to circumstance  Speech Logical/coherent  Interaction Minimal  Motor Activity Other (Comment) (WDL)  Appearance/Hygiene Unremarkable  Behavior Characteristics Appropriate to situation  Mood Pleasant  Thought Process  Coherency WDL  Content WDL  Delusions None reported or observed  Perception WDL  Hallucination None reported or observed  Judgment Impaired  Confusion None  Danger to Self  Current suicidal ideation? Denies  Danger to Others  Danger to Others None reported or observed

## 2022-01-30 NOTE — Group Note (Signed)
LCSW Group Therapy Note   Group Date: 01/30/2022 Start Time: 1300 End Time: 1400  Type of Therapy and Topic: Group Therapy: Control  Participation Level: Active  Description of Group: In this group patients will discuss what is out of their control, what is somewhat in their control, and what is within their control.  They will be encouraged to explore what issues they can control and what issues are out of their control within their daily lives. They will be guided to discuss their thoughts, feelings, and behaviors related to these issues. The group will process together ways to better control things that are well within our own control and how to notice and accept the things that are not within our control. This group will be process-oriented, with patients participating in exploration of their own experiences as well as giving and receiving support and challenge from other group members.  During this group 2 worksheets will be provided to each patient to follow along and fill out.   Therapeutic Goals: 1. Patient will identify what is within their control and what is not within their control. 2. Patient will identify their thoughts and feelings about having control over their own lives. 3. Patient will identify their thoughts and feelings about not having control over everything in their lives.. 4. Patient will identify ways that they can have more control over their own lives. 5. Patient will identify areas were they can allow others to help them or provide assistance.  Summary of Patient Progress: The Pt attended group and remained there the entire time.  The Pt accepted all worksheets and followed along throughout the group session.  The Pt was appropriate with their peers and demonstrated understanding of the topic being discussed.  The Pt was able to identify areas of no control and how they could break those areas down into smaller pieces to find where they had some control in the  situation.    Aram Beecham, LCSWA 01/30/2022  2:19 PM

## 2022-01-30 NOTE — Progress Notes (Signed)
Adult Psychoeducational Group Note  Date:  01/30/2022 Time:  9:51 AM  Group Topic/Focus:  Goals Group:   The focus of this group is to help patients establish daily goals to achieve during treatment and discuss how the patient can incorporate goal setting into their daily lives to aide in recovery.  Participation Level:  Active  Participation Quality:  Appropriate  Affect:  Appropriate  Cognitive:  Appropriate  Insight: Appropriate  Engagement in Group:  Engaged  Modes of Intervention:  Discussion  Additional Comments: Patient attended group  Jerry Garrison 01/30/2022, 9:51 AM

## 2022-01-31 DIAGNOSIS — F201 Disorganized schizophrenia: Secondary | ICD-10-CM

## 2022-01-31 MED ORDER — ESCITALOPRAM OXALATE 5 MG PO TABS
5.0000 mg | ORAL_TABLET | Freq: Every day | ORAL | 0 refills | Status: DC
Start: 2022-02-01 — End: 2023-07-12

## 2022-01-31 MED ORDER — HYDROXYZINE HCL 25 MG PO TABS
25.0000 mg | ORAL_TABLET | Freq: Three times a day (TID) | ORAL | 0 refills | Status: DC | PRN
Start: 2022-01-31 — End: 2023-07-12

## 2022-01-31 MED ORDER — TRAZODONE HCL 100 MG PO TABS
100.0000 mg | ORAL_TABLET | Freq: Every day | ORAL | 0 refills | Status: DC
Start: 2022-01-31 — End: 2023-07-12

## 2022-01-31 MED ORDER — PALIPERIDONE PALMITATE ER 156 MG/ML IM SUSY: 156.0000 mg | PREFILLED_SYRINGE | Freq: Once | INTRAMUSCULAR | Status: DC

## 2022-01-31 MED ORDER — NICOTINE 14 MG/24HR TD PT24
14.0000 mg | MEDICATED_PATCH | Freq: Every day | TRANSDERMAL | 0 refills | Status: DC
Start: 2022-02-01 — End: 2023-07-12

## 2022-01-31 MED ORDER — INVEGA SUSTENNA 156 MG/ML IM SUSY: 156.0000 mg | PREFILLED_SYRINGE | INTRAMUSCULAR | 0 refills | Status: DC

## 2022-01-31 NOTE — Progress Notes (Signed)
  [facility] Adult Case Management Discharge Plan :  Will you be returning to the same living situation after discharge:  Yes,  tent At discharge, do you have transportation home?: Yes,  mother at 12:00pm Do you have the ability to pay for your medications: No.  Will need assistance from community organization  Release of information consent forms completed and emailed to Medical Records, then turned in to Medical Records by CSW.   Patient to Follow up at:  Follow-up Information     Services, [facility] Follow up.   Why: Referral made for residential treatment. Contact information: [address] [place] [address] [phone]         [facility]. Go to.   Specialty: Behavioral Health Why: Please go to this provider for therapy and medication management services on Monday or Wednesday, arrive by 7:30 am.  Services are provided on a first come, first served basis. Contact information: [address] [place] [address] [phone]                Next level of care provider has access to [facility] Link:no  Safety Planning and Suicide Prevention discussed: Yes,  with mother     Has patient been referred to the Quitline?: Patient refused referral  Patient has been referred for addiction treatment: Yes  Lynnell Chad, LCSW 01/31/2022, 9:27 AM

## 2022-01-31 NOTE — Progress Notes (Addendum)
RN met with pt and reviewed pt's discharge instructions.  Pt verbalized understanding of discharge instructions and pt did not have any questions. RN reviewed and provided pt with a copy of SRA, AVS and Transition Record.  RN returned pt's belongings to pt.  Pt denied SI/HI/AVH and voiced no concerns.  Pt was appreciative of the care pt received at [facility].  Patient discharged to the lobby without incident. 

## 2022-01-31 NOTE — [facility] Group Notes (Signed)
[facility] Group Notes:  (Nursing/MHT/Case Management/Adjunct)  Date:  01/31/2022  Time:  10:07 AM  Type of Therapy:   Orientation/ Goals  group  Participation Level:  Did Not Attend  Participation Quality:      Affect:      Cognitive:      Insight:  None  Engagement in Group:      Modes of Intervention:      Summary of Progress/Problems:pt. Did not attend group.  Anica S Ponzio 01/31/2022, 10:07 AM

## 2022-01-31 NOTE — Discharge Summary (Signed)
Physician Discharge Summary Note  Patient:  Jerry Garrison is an 21 y.o., male MRN:  [id number] DOB:  [birth year]  Patient phone:  [phone] (home)   Patient address:   [place] [address],   Total Time spent with patient: 1 hour  Date of Admission:  01/27/2022 Date of Discharge:   01/31/2022  Reason for Admission: Patient is a 21 year old male previously diagnosed with schizophrenia, who was admitted to the psychiatric hospital after overdose on heroin, and concern for suicidal thoughts.  Patient was discharged in February 2023 from this hospital.  He was medically admitted, and treated for aspiration pneumonia after vomiting while obtunded. He is on Augmentin for this.   Principal Problem: Schizophrenia, unspecified ([name])  Discharge Diagnoses: Principal Problem:   Schizophrenia, unspecified ([name]) Active Problems:   Tobacco use disorder   Cannabis use disorder   Past Psychiatric History: Schizophrenia Cannabis use disorder Patient reports being hospitalized 2 times in the past, most recently at this hospital in February 2023 History of suicide attempt "I am not sure".  Of note, the patient was admitted to her last hospitalization for psychosis and suicidal thoughts with plan to jump off a parking deck. Past psychiatric medication history: Zyprexa and Invega per chart review.  Patient on exam, is not sure.   Past Medical History:  Past Medical History:  Diagnosis Date   Schizophrenia ([place])    Substance abuse ([place])     Past Surgical History:  Procedure Laterality Date   NO PAST SURGERIES     Family History: History reviewed. No pertinent family history.  Family Psychiatric  History: History reviewed. No pertinent family history  Social History:  Social History   Substance and Sexual Activity  Alcohol Use No     Social History   Substance and Sexual Activity  Drug Use Yes   Types: Marijuana, Oxycodone    Social History    Socioeconomic History   Marital status: Single    Spouse name: Not on file   Number of children: Not on file   Years of education: Not on file   Highest education level: Not on file  Occupational History   Not on file  Tobacco Use   Smoking status: Never   Smokeless tobacco: Never  Vaping Use   Vaping Use: Never used  Substance and Sexual Activity   Alcohol use: No   Drug use: Yes    Types: Marijuana, Oxycodone   Sexual activity: Not Currently  Other Topics Concern   Not on file  Social History Narrative   Not on file   Social Determinants of Health   Financial Resource Strain: Not on file  Food Insecurity: Not on file  Transportation Needs: Not on file  Physical Activity: Not on file  Stress: Not on file  Social Connections: Not on file    Hospital Course:  Hospital Course:   Patient was admitted to the Adult Unit at [facility] under the service of Dr. Caswell Corwin.  Routine labs of 01/26/22 reviewed, (no new labs). CBC with diff: WBC 12.1, Neutrophil 10.8 otherwise normal;  CMP glucose 105, Calcium 8.3, Albumin 3.0, AST 101, ALT 58,Total Protein 6.1, Total Bilirubin 1.9 otherwise normal.TSH,   UDS positive for THC.  TSH on 08/07/21 1.866 N, HgbA1c 4.4 Normal. Lipid panel WDL.  Maintained Q 15 minutes observation for safety. During this admission patient did not require any change in her observation level and no PRN or time out  Was  required. Length of stay was 5 days.  During this hospitalization the patient will receive psychosocial  assessment. Patient participated in  all forms of therapy including; group, milieu, and family therapy. Psychotherapy: Social and Airline pilot, anti-bullying, learning based strategies, cognitive behavioral, and family object relations individuation separation intervention psychotherapies can be considered.  During this admission patient met with her psychiatrist daily and received full nursing service. An  individualized treatment plan according to the patient's age, level of functioning, diagnostic considerations and acute behavior was initiated.   To continue to reduce current symptoms to base line and improve the patient's overall level of functioning continued : Continue Augmentin for aspiration pneumonia -Start Invega 6 mg p.o. nightly for schizophrenia.  Patient previously tolerated Invega, without any reported side effects. -Start Lorayne Bender Sustenna 234 mg once daily IM LAI today 01/27/22,  For schizophrenia.  Patient reports no side effects after receiving this medication in February 2023.  He reports he did not follow up for subsequent injection.  Therefore we will keep the patient hospitalized until he receives his second dose of this medication in 4 to 7 days.  We will continue p.o. Invega until the patient receives the second [place] loading dose injection. QTc is 444 -We will continue to monitor for depressive symptoms, and start antidepressant if necessary.  We will first treat the psychotic disorder. Continued Trazodone 25 mg daily at bedtime for sleep. Patient is tolerating medications well with no side effects at discharge. Patient was able to verbalize reasons for living and a safety plan was developed.  He appears to have a positive outlook and is agreeable to continued therapy and medication management. Safety plan was discussed with guardian who is in agreement. Patient was provided with the national suicide hotline # 1-800-273-TALK as well as [facility]  number. Medications on admission were:  Continue Augmentin for aspiration pneumonia -Start Invega 6 mg p.o. nightly for schizophrenia.   -Start Lorayne Bender Sustenna 234 mg once daily IM LAI on 7-25. Patient has responded well to these medications and is sleeping better.  During the course of hospitalization medication changes consisted of: Continue Augmentin for aspiration pneumonia -Change Invega 6 mg p.o.  once daily to once at bedtime - for schizophrenia. -Previously administered [place] 234 mg once daily IM LAI on 7-25.  -Start trazodone 100 mg qhs for depression and insomnia -Continue Trazodone 50 mg qhs prn for depression and insomnia  -Lexapro 5 mg po daily for depression. At discharge, patient is medically stable and basic physical exam is within normal limits with no abnormal findings.   Patient appeared to benefit from the structure and consistency of the inpatient unit, continued current medication therapy, and integrated therapies. During the hospitalization the patient gradually improved and expressed no suicidal or homicidal thoughts, plan or intent to act on a plan. His depressive symptoms and anxiety have appeared to subside and she has a positive outlook on her life going forward. He is able to express his feelings, and recognize when he is feeling overwhelmed. He has been calm and cooperative with no behavioral issues during this hospitalization.  A discharge conference was held with during which findings, recommendations, safety plans and aftercare plan were discussed with the caregivers. Please refer to the therapist note for further information about issues discussed on family session. At discharge patient denied psychotic symptoms, suicidal and homicidal ideation, intent or plan, and there was no evidence of psychotic, manic or  Depressive symptoms. Patient is discharged  home in stable condition.   Physical Findings: AIMS:  , ,  ,  ,    CIWA:    COWS:     Musculoskeletal: Strength & Muscle Tone: within normal limits Gait & Station: normal Patient leans: N/A  Psychiatric Specialty Exam:  Presentation  General Appearance: Appropriate for Environment; Casual; Fairly Groomed  Eye Contact:Good  Speech:Clear and Coherent; Normal Rate  Speech Volume:Normal  Handedness:Right  Mood and Affect  Mood:Euthymic  Affect:Appropriate; Congruent  Thought Process   Thought Processes:Coherent  Descriptions of Associations:Intact  Orientation:Full (Time, Place and Person)  Thought Content:Logical  History of Schizophrenia/Schizoaffective disorder:Yes  Duration of Psychotic Symptoms:Greater than six months  Hallucinations:Hallucinations: None Description of Auditory Hallucinations: N/A  Ideas of Reference:None  Suicidal Thoughts:Suicidal Thoughts: No SI Passive Intent and/or Plan: -- (Denies)  Homicidal Thoughts:Homicidal Thoughts: No  Sensorium  Memory:Immediate Good; Recent Good; Remote Good  Judgment:Fair  Insight:Fair  Executive Functions  Concentration:Good  Attention Span:Good  [place] of Knowledge:Good  Language:Good  Psychomotor Activity  Psychomotor Activity:Psychomotor Activity: Normal  Assets  Assets:Communication Skills; Desire for Improvement; Financial Resources/Insurance; Housing; Physical Health; Resilience; Social Support  Sleep  Sleep:Sleep: Good Number of Hours of Sleep: 8  Physical Exam: Physical Exam Vitals and nursing note reviewed.  Constitutional:      Appearance: Normal appearance.  HENT:     Head: Normocephalic and atraumatic.     Right Ear: External ear normal.     Left Ear: External ear normal.     Nose: Nose normal.     Mouth/Throat:     Mouth: Mucous membranes are moist.     Pharynx: Oropharynx is clear.  Eyes:     Extraocular Movements: Extraocular movements intact.     Conjunctiva/sclera: Conjunctivae normal.     Pupils: Pupils are equal, round, and reactive to light.  Cardiovascular:     Rate and Rhythm: Normal rate.     Pulses: Normal pulses.  Pulmonary:     Effort: Pulmonary effort is normal.  Abdominal:     Palpations: Abdomen is soft.  Genitourinary:    Comments: Deferred Musculoskeletal:        General: Normal range of motion.     Cervical back: Normal range of motion and neck supple.  Skin:    General: Skin is warm.  Neurological:     General: No  focal deficit present.     Mental Status: He is alert and oriented to person, place, and time.  Psychiatric:        Mood and Affect: Mood normal.        Behavior: Behavior normal.    Review of Systems  Constitutional: Negative.  Negative for chills and fever.  HENT: Negative.  Negative for hearing loss and tinnitus.   Eyes: Negative.  Negative for blurred vision and double vision.  Respiratory: Negative.  Negative for cough and hemoptysis.   Cardiovascular: Negative.  Negative for chest pain and palpitations.  Gastrointestinal: Negative.  Negative for abdominal pain, constipation, diarrhea, heartburn, nausea and vomiting.  Genitourinary: Negative.  Negative for dysuria, frequency and urgency.  Musculoskeletal: Negative.  Negative for myalgias and neck pain.  Skin: Negative.  Negative for itching and rash.  Neurological: Negative.  Negative for dizziness, tingling, tremors, sensory change and headaches.  Endo/Heme/Allergies: Negative.  Negative for environmental allergies and polydipsia. Does not bruise/bleed easily.       Latex Latex  Itching Not Specified  10/23/2021    Psychiatric/Behavioral:  Positive for substance abuse. Negative for  depression. The patient is nervous/anxious.    Blood pressure 91/77, pulse 91, temperature 98.4 F (36.9 C), temperature source Oral, resp. rate 18, height '5\' 10"'  (1.778 m), weight 65.2 kg, SpO2 99 %. Body mass index is 20.63 kg/m.   Social History   Tobacco Use  Smoking Status Never  Smokeless Tobacco Never   Tobacco Cessation:  A prescription for an FDA-approved tobacco cessation medication provided at discharge   Blood Alcohol level:  Lab Results  Component Value Date   [facility] <10 01/24/2022   ETH <10 96/28/3662    Metabolic Disorder Labs:  Lab Results  Component Value Date   HGBA1C 4.4 (L) 08/07/2021   MPG 79.58 08/07/2021   No results found for: "PROLACTIN" Lab Results  Component Value Date   CHOL 135 08/07/2021   TRIG 26  08/07/2021   HDL 52 08/07/2021   CHOLHDL 2.6 08/07/2021   VLDL 5 08/07/2021   LDLCALC 78 08/07/2021    See Psychiatric Specialty Exam and Suicide Risk Assessment completed by Attending Physician prior to discharge.  Discharge destination:  Home  Is patient on multiple antipsychotic therapies at discharge:  No   Has Patient had three or more failed trials of antipsychotic monotherapy by history:  No  Recommended Plan for Multiple Antipsychotic Therapies: NA   Allergies as of 01/31/2022       Reactions   Latex Itching        Medication List     TAKE these medications      Indication  amoxicillin-clavulanate 875-125 MG tablet Commonly known as: AUGMENTIN Take 1 tablet by mouth every 12 (twelve) hours for 5 days.  Indication: Lower Respiratory Tract Infection   escitalopram 5 MG tablet Commonly known as: LEXAPRO Take 1 tablet (5 mg total) by mouth daily. Start taking on: February 01, 2022  Indication: Major Depressive Disorder   hydrOXYzine 25 MG tablet Commonly known as: ATARAX Take 1 tablet (25 mg total) by mouth 3 (three) times daily as needed for anxiety.  Indication: Feeling Anxious   Invega Sustenna 156 MG/ML [place] injection Generic drug: paliperidone Inject 1 mL (156 mg total) into the muscle every 28 (twenty-eight) days. Start taking on: March 03, 2022 What changed: These instructions start on March 03, 2022. If you are unsure what to do until then, ask your doctor or other care provider.  Indication: Mood stability   nicotine 14 mg/24hr patch Commonly known as: NICODERM CQ - dosed in mg/24 hours Place 1 patch (14 mg total) onto the skin daily. (May buy from over the counter): For smoking cessation. Start taking on: February 01, 2022  Indication: Nicotine Addiction   traZODone 100 MG tablet Commonly known as: DESYREL Take 1 tablet (100 mg total) by mouth at bedtime.  Indication: Trouble Sleeping        Follow-up Information     Services, [facility] Follow up.   Why: Referral made for residential treatment. Contact information: [address] [place] [address] [phone]         [facility]. Go to.   Specialty: Behavioral Health Why: Please go to this provider for therapy and medication management services on Monday or Wednesday, arrive by 7:30 am.  Services are provided on a first come, first served basis. Contact information: [place] [phone]                Follow-up recommendations:  Activity:  As Tolerated Diet:  Regular  Diet  Comments:  Discharge Recommendations:  The patient is being discharged with his family. Patient is to take his discharge medications as ordered.  See follow up above. We recommend that he participate in individual therapy to target uncontrollable agitation and substance abuse.  We recommend that he participate in family therapy to target the conflict with his family, to improve communication skills and conflict resolution skills.  Family is to initiate/implement a contingency based behavioral model to address patient's behavior. We recommend that he get AIMS scale, height, weight, blood pressure, fasting lipid panel, fasting blood sugar in three months from discharge as he's on atypical antipsychotics.  Patient will benefit from monitoring of recurrent suicidal ideation since patient is on antidepressant medication. The patient should abstain from all illicit substances and alcohol.  If the patient's symptoms worsen or do not continue to improve or if the patient becomes actively suicidal or homicidal then it is recommended that the patient return to the closest hospital emergency room or call 911 for further evaluation and treatment. National Suicide Prevention Lifeline 1800-SUICIDE or (814) 316-8059. Please follow up with your primary medical doctor for all other medical needs.  The patient has been educated  on the possible side effects to medications and he/his guardian is to contact a medical professional and inform outpatient provider of any new side effects of medication. He is to take regular diet and activity as tolerated.  Will benefit from moderate daily exercise. Family was educated about removing/locking any firearms, medications or dangerous products from the home.   Signed: Laretta Bolster, FNP 01/31/2022, 10:50 AM

## 2022-01-31 NOTE — Group Note (Signed)
LCSW Group Therapy Note  01/31/2022     Type of Therapy and Topic:  Group Therapy: Anger and Coping Skills  Participation Level:  Did Not Attend   Description of Group:   In this group, patients learned how to recognize the physical, cognitive, emotional, and behavioral responses they have to anger-provoking situations.  They identified how they usually or often react when angered, and learned how healthy and unhealthy coping skills work initially, but the unhealthy ones stop working.   They analyzed how their frequently-chosen coping skill is possibly beneficial and how it is possibly unhelpful.  The group discussed a variety of healthier coping skills that could help in resolving the actual issues, as well as how to go about planning for the the possibility of future similar situations.  Therapeutic Goals: Patients will identify one thing that makes them angry and how they feel emotionally and physically, what their thoughts are or tend to be in those situations, and what healthy or unhealthy coping mechanism they typically use Patients will identify how their coping technique works for them, as well as how it works against them. Patients will explore possible new behaviors to use in future anger situations. Patients will learn that anger itself is normal and cannot be eliminated, and that healthier coping skills can assist with resolving conflict rather than worsening situations.  Summary of Patient Progress:  The patient was invited to group, did not attend.  Therapeutic Modalities:   Cognitive Behavioral Therapy   Taniah Reinecke J Grossman-Orr  .  

## 2022-01-31 NOTE — [facility] Suicide Risk Assessment (Signed)
Suicide Risk Assessment  Discharge Assessment    [facility] Discharge Suicide Risk Assessment   Principal Problem: Schizophrenia, unspecified (HCC) Discharge Diagnoses: Principal Problem:   Schizophrenia, unspecified (HCC) Active Problems:   Tobacco use disorder   Cannabis use disorder  Total Time spent with patient: 1 hour  Musculoskeletal: Strength & Muscle Tone: within normal limits Gait & Station: normal Patient leans: N/A  Psychiatric Specialty Exam  Presentation  General Appearance: Appropriate for Environment; Casual; Fairly Groomed  Eye Contact:Good  Speech:Clear and Coherent; Normal Rate  Speech Volume:Normal  Handedness:Right  Mood and Affect  Mood:Euthymic  Duration of Depression Symptoms: Greater than two weeks  Affect:Appropriate; Congruent  Thought Process  Thought Processes:Coherent  Descriptions of Associations:Intact  Orientation:Full (Time, Place and Person)  Thought Content:Logical  History of Schizophrenia/Schizoaffective disorder:Yes  Duration of Psychotic Symptoms:Greater than six months  Hallucinations:Hallucinations: None Description of Auditory Hallucinations: N/A  Ideas of Reference:None  Suicidal Thoughts:Suicidal Thoughts: No SI Passive Intent and/or Plan: -- (Denies)  Homicidal Thoughts:Homicidal Thoughts: No  Sensorium  Memory:Immediate Good; Recent Good; Remote Good  Judgment:Fair  Insight:Fair  Executive Functions  Concentration:Good  Attention Span:Good  Recall:Good  Fund of Knowledge:Good  Language:Good  Psychomotor Activity  Psychomotor Activity:Psychomotor Activity: Normal  Assets  Assets:Communication Skills; Desire for Improvement; Financial Resources/Insurance; Housing; Physical Health; Resilience; Social Support  Sleep  Sleep:Sleep: Good Number of Hours of Sleep: 8  Physical Exam: Physical Exam Vitals and nursing note reviewed.  Constitutional:      Appearance: Normal appearance.  HENT:      Head: Normocephalic and atraumatic.     Right Ear: External ear normal.     Left Ear: External ear normal.     Nose: Nose normal.     Mouth/Throat:     Mouth: Mucous membranes are moist.     Pharynx: Oropharynx is clear.  Eyes:     Extraocular Movements: Extraocular movements intact.     Conjunctiva/sclera: Conjunctivae normal.     Pupils: Pupils are equal, round, and reactive to light.  Cardiovascular:     Rate and Rhythm: Normal rate.     Pulses: Normal pulses.  Pulmonary:     Effort: Pulmonary effort is normal.  Abdominal:     Palpations: Abdomen is soft.  Genitourinary:    Comments: Deferred Musculoskeletal:        General: Normal range of motion.     Cervical back: Normal range of motion and neck supple.  Skin:    General: Skin is warm.  Neurological:     General: No focal deficit present.     Mental Status: He is alert and oriented to person, place, and time.  Psychiatric:        Mood and Affect: Mood normal.        Behavior: Behavior normal.    Review of Systems  Constitutional: Negative.  Negative for chills and fever.  HENT: Negative.  Negative for hearing loss and tinnitus.   Eyes: Negative.  Negative for blurred vision and double vision.  Respiratory:  Positive for cough (Hx of Pneumonia) and sputum production (Hx of Pneumania).   Cardiovascular: Negative.  Negative for chest pain and palpitations.  Skin: Negative.  Negative for itching and rash.  Neurological:  Negative for dizziness, tingling, tremors, sensory change, speech change, focal weakness, seizures, loss of consciousness, weakness and headaches.  Endo/Heme/Allergies: Negative.  Negative for environmental allergies and polydipsia. Does not bruise/bleed easily.       Latex Latex  Itching Not Specified  10/23/2021  Psychiatric/Behavioral:  Positive for depression, hallucinations and substance abuse.    Blood pressure 91/77, pulse 91, temperature 98.4 F (36.9 C), temperature source Oral, resp.  rate 18, height 5\' 10"  (1.778 m), weight 65.2 kg, SpO2 99 %. Body mass index is 20.63 kg/m.  Mental Status Per Nursing Assessment::   On Admission:  NA  Demographic Factors:  Male, Adolescent or young adult, Low socioeconomic status, Living alone, and Unemployed  Loss Factors: Loss of significant relationship and Financial problems/change in socioeconomic status  Historical Factors: Impulsivity  Risk Reduction Factors:   Religious beliefs about death, Living with another person, especially a relative, Positive social support, Positive therapeutic relationship, and Positive coping skills or problem solving skills  Continued Clinical Symptoms:  Depression:   Aggression Delusional Hopelessness Impulsivity Schizophrenia:   Less than 95 years old More than one psychiatric diagnosis Unstable or Poor Therapeutic Relationship Previous Psychiatric Diagnoses and Treatments  Cognitive Features That Contribute To Risk:  Polarized thinking    Suicide Risk:  Minimal: No identifiable suicidal ideation.  Patients presenting with no risk factors but with morbid ruminations; may be classified as minimal risk based on the severity of the depressive symptoms   Follow-up Information     Services, [facility] Follow up.   Why: Referral made for residential treatment. Contact information: [address] [place] [phone]         [facility] Go to.   Specialty: Behavioral Health Why: Please go to this provider for therapy and medication management services on Monday or Wednesday, arrive by 7:30 am.  Services are provided on a first come, first served basis. Contact information: [address] [place] [phone]                Plan Of Care/Follow-up recommendations: Discharge Recommendations:  The patient is being discharged with his family. Patient is to take his discharge medications as ordered.   See follow up above. We recommend that he participate in individual therapy to target uncontrollable agitation and substance abuse.  We recommend that he participate in family therapy to target the conflict with his family, to improve communication skills and conflict resolution skills.  Family is to initiate/implement a contingency based behavioral model to address patient's behavior. We recommend that he get AIMS scale, height, weight, blood pressure, fasting lipid panel, fasting blood sugar in three months from discharge as he's on atypical antipsychotics.  Patient will benefit from monitoring of recurrent suicidal ideation since patient is on antidepressant medication. The patient should abstain from all illicit substances and alcohol.  If the patient's symptoms worsen or do not continue to improve or if the patient becomes actively suicidal or homicidal then it is recommended that the patient return to the closest hospital emergency room or call 911 for further evaluation and treatment. National Suicide Prevention Lifeline 1800-SUICIDE or [phone]. Please follow up with your primary medical doctor for all other medical needs.  The patient has been educated on the possible side effects to medications and he/his guardian is to contact a medical professional and inform outpatient provider of any new side effects of medication. He is to take regular diet and activity as tolerated.  Will benefit from moderate daily exercise. Family was educated about removing/locking any firearms, medications or dangerous products from the home.  Activity:  As Tolerated Diet:  Regular, low salt  [phone], FNP 01/31/2022, 10:48 AM

## 2022-02-10 ENCOUNTER — Telehealth (HOSPITAL_COMMUNITY): Payer: Self-pay | Admitting: Licensed Clinical Social Worker

## 2022-02-16 ENCOUNTER — Other Ambulatory Visit (HOSPITAL_COMMUNITY): Payer: Self-pay

## 2022-02-16 NOTE — Telephone Encounter (Signed)
follow

## 2022-06-24 ENCOUNTER — Ambulatory Visit (HOSPITAL_COMMUNITY)
Admission: EM | Admit: 2022-06-24 | Discharge: 2022-06-24 | Disposition: A | Discharge status: Home / Self Care | Payer: No Payment, Other | Attending: Psychiatry | Admitting: Psychiatry

## 2022-06-24 DIAGNOSIS — R45851 Suicidal ideations: Secondary | ICD-10-CM | POA: Diagnosis not present

## 2022-06-24 DIAGNOSIS — F4323 Adjustment disorder with mixed anxiety and depressed mood: Secondary | ICD-10-CM | POA: Insufficient documentation

## 2022-06-24 DIAGNOSIS — F209 Schizophrenia, unspecified: Secondary | ICD-10-CM | POA: Diagnosis not present

## 2022-06-24 DIAGNOSIS — Z79899 Other long term (current) drug therapy: Secondary | ICD-10-CM | POA: Diagnosis not present

## 2022-06-24 NOTE — ED Provider Notes (Signed)
Upon discharge, I was made aware that pt's sister and mother had presented back to facility. Pt and pt's mother had gotten into a verbal argument after pt made suicidal statement in car. Witnessed by pt and pt's sister. [facility]ART team arrived and felt that pt was passively suicidal and would not pursue IVC. Pt reports passive ideation. He denies active suicidal ideation, plan or intent. He denies current auditory visual hallucinations or paranoia. Discussed with pt and pt's mother pursuing IVC if there were safety concerns for pt or others. They deny need for IVC and agreed if they felt they were concerned regarding pt's safety for himself or others they would pursue IVC. Pt's mother states she is familiar with IVC process as they had pursued IVC before. Pt left in vehicle with his sister and mother.

## 2022-06-24 NOTE — ED Triage Notes (Signed)
[facility] [employer] due to bizarre behavior and increased agitation. Pt was irrate at his mothers restaurant today and his mother called the police for assistance. Pt reports he was supposed to go visit his girlfriend in [place] for Christmas but she broke up with him and told him he could not come to visit her.Patient is currently disorganized, unable to answer questions, has thought blocking, bizarre behavior and he is responding to internal stimuli. He is unable to answer questions despite being alert. Pt reports being diagnosed with Schizophrenia and has not taken medication in 9 months and states he does not want medication. Pt denies SI/HI and AVH at this time by shaking his head no.

## 2022-06-24 NOTE — BH Assessment (Signed)
Comprehensive Clinical Assessment (CCA) Note  06/24/2022 Jerry Garrison Wheat [id number]  Disposition: Per Jerry ChandlerJacqueline Eun Lee, NP outpatient treatment is recommended.  Provider spoke with patient and mother about recommendation for counseling and to consider medication management. Discussed open [facility] hours at [facility] which were also provided in discharge paperwork.  The patient demonstrates the following risk factors for suicide: Chronic risk factors for suicide include: psychiatric disorder of Schizophrenia, unspecified, hx of substance abuse . Acute risk factors for suicide include: family or marital conflict and loss (financial, interpersonal, professional). Protective factors for this patient include: positive social support and positive therapeutic relationship. Considering these factors, the overall suicide risk at this point appears to be low. Patient is appropriate for outpatient follow up.  Patient is a 21 year old male with a history of Schizophrenia, unspecified who [facility]  Patient presents escorted by [employer] due to bizarre behavior and increased agitation. Patient was agitated and "irate" at his [employer] restaurant today and his mother called the police for assistance. Patient reports he was supposed to go visit his "baby mama" in [place] for [place], and had planned to surprise her.  He spoke with her today and she had heard he was planning to come and told him not to come.  She shared with him that she is now in a relationship with someone else.  He is also upset, as he was hoping to see his 253 y.o. son while there.  Patient appeared disorganized on arrival, unable to answer questions and appeared to have significant thought blocking and response to internal stimuli. He states he was last admitted for inpatient treatment in July 2023 and per EHR, it appears he was admitted from 7/25 - 01/31/2022.  He states he discontinued medications  upon discharge.  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  He denies current stressors, outside of the break up today; causing him to have to cancel his Christmas plans.  Patient endorses SI, stating he considered stabbing himself, however he states he wouldn't do it as "fear stopped me."  He denies current intent to harm himself and is able to contract for safety.  He denies HI.  He endorses AH, stating he hears voices often that appear to be at baseline.  He does not appear to be distressed by the voices and he denies command hallucinations.  Patient gives verbal consent for provider to speak with his mother.   Per Jerry ChandlerJacqueline Eun Lee, NP note: "Collateral obtained from pt's mother, Jerry NickelKimberly Garrison, and pt's sister, Jerry Garrison, at [phone] with pt verbal consent. Jerry BradfordKimberly and Jerry Garrison state that to their knowledge patient is using THC, which includes edible and smoking. They state patient's marijuana use is concerning to them. Per Jerry BradfordKimberly, patient made a statement to his baby's mother today that "he was going to kill himself" because of her not allowing him to visit her. Patient's baby's mother was the one to call police. Jerry BradfordKimberly states pt is heartbroken and loves his baby's mother. She does not believe he was actually going to do anything.    Provider spoke with patient again and had his mother on speaker phone in the room.  Patient was asked about statement that "he was going to kill himself". Patient states he made this statement because he was "using manipulation". Patient's mother clarifies patient is not living in her restaurant. He lives at home with her. She states that  he does nap at times in her office at work while she is working. She states patient has been doing well recently. She does not want patient to have an inpatient admission. Patient also states he does not want an inpatient admission. Patient's mother states she does not have  safety concerns with bringing him home today. Patient's mother states she can pick pt up from this facility and help maintain safety upon discharge.   Patient's mother arrived at facility with his sister to pick patient up.   Discussed with patient's mother, sister, and pt that if there is worsening suicidal ideation or safety concerns they can call 911/EMS, go to the nearest emergency room or crisis center. They verbalized understanding and agreed to do so. Pt's mother, pt's sister, and pt deny safety concerns with discharge today."  Chief Complaint:  Chief Complaint  Patient presents with   Agitation   Schizophrenia   Visit Diagnosis: Schizophrenia, Unspecified   Flowsheet Row ED from 06/24/2022 in [facility] from [facility] from 09/01/2021 in [facility]  Thoughts that you would be better off dead, or of hurting yourself in some way Several days Not at all Several days  PHQ-9 Total Score 11 17 11       Flowsheet Row ED from 06/24/2022 in [facility] (Discharged[facility] (Discharged) from 01/24/2022 in [facility]  C-SSRS RISK CATEGORY Moderate Risk No Risk No Risk        CCA Screening, Triage and Referral (STR)  Patient Reported Information How did you hear about [facility]? Legal System  What Is the Reason for Your Visit/Call Today? Pt presents to [facility] voluntarily escorted by [facility] due to bizarre behavior and increased agitation. Pt was irrate at his mothers restaurant today and his mother called the police for assistance. Pt reports he was supposed to go visit his girlfriend in [facility] for Christmas but she broke up with him and told him he could not come to visit her.Patient is currently disorganized, unable to  answer questions, has thought blocking, bizarre behavior and he is responding to internal stimuli. He is unable to answer questions despite being alert. Pt reports being diagnosed with Schizophrenia and has not taken medication in 9 months and states he does not want medication. Pt denies SI/HI and AVH at this time by shaking his head no.  How Long Has This Been Causing You Problems? <Week  What Do You Feel Would Help You the Most Today? Treatment for Depression or other mood problem   Have You Recently Had Any Thoughts About Hurting Yourself? No  Are You Planning to Commit Suicide/Harm Yourself At This time? No   Flowsheet Row ED from 06/24/2022 in [facility] (Discharged) [facility] (Discharged) from 01/24/2022 in [facility] UN[facility]  C-SSRS RISK CATEGORY No Risk No Risk No Risk       Have you Recently Had Thoughts About Hurting Someone [facility]? No  Are You Planning to Harm Someone at This Time? No  Explanation: No data recorded  Have You Used Any Alcohol or Drugs in the Past 24 Hours? Yes  What Did You Use and How Much? Marijuana "just a little bit"   Do You Currently Have a  Therapist/Psychiatrist? No  Name of Therapist/Psychiatrist: Name of Therapist/Psychiatrist: Not engaged in outpt tx   Have You Been Recently Discharged From Any Office Practice or Programs? Yes  Explanation of Discharge From Practice/Program: D/C from [facility] 01/31/22     CCA Screening Triage Referral Assessment Type of Contact: Face-to-Face  Telemedicine Service Delivery:   Is this Initial or Reassessment?   Date Telepsych consult ordered in [facility]:    Time Telepsych consult ordered in [facility]:    Location of Assessment: [facility]  Provider Location: [facility]   Collateral Involvement: Mother provided collateral   Does Patient  Have a Automotive engineer Guardian? No  Legal Guardian Contact Information: N/A  Copy of Legal Guardianship Form: No data recorded Legal Guardian Notified of Arrival: No data recorded Legal Guardian Notified of Pending Discharge: No data recorded If Minor and Not Living with Parent(s), Who has Custody? N/A  Is CPS involved or ever been involved? Never  Is APS involved or ever been involved? Never   Patient Determined To Be At Risk for Harm To Self or Others Based on Review of Patient Reported Information or Presenting Complaint? Yes, for Self-Harm  Method: No data recorded Availability of Means: No data recorded Intent: No data recorded Notification Required: No data recorded Additional Information for Danger to Others Potential: No data recorded Additional Comments for Danger to Others Potential: N/A  Are There Guns or Other Weapons in Your Home? No  Types of Guns/Weapons: N/A  Are These Weapons Safely Secured?                            No data recorded Who Could Verify You Are Able To Have These Secured: N/A  Do You Have any Outstanding Charges, Pending Court Dates, Parole/Probation? N/A  Contacted To Inform of Risk of Harm To Self or Others: Family/Significant Other:    Does Patient Present under Involuntary Commitment? No    [place] of Residence: [place]   Patient Currently Receiving the Following Services: Not Receiving Services   Determination of Need: Urgent (48 hours)   Options For Referral: [facility]; Medication Management; Outpatient Therapy; Inpatient Hospitalization     CCA Biopsychosocial Patient Reported Schizophrenia/Schizoaffective Diagnosis in Past: Yes   Strengths: has family support   Mental Health Symptoms Depression:   Hopelessness; Worthlessness; Sleep (too much or little)   Duration of Depressive symptoms:  Duration of Depressive Symptoms: Greater than two weeks   Mania:   Irritability; Racing thoughts   Anxiety:     Tension; Worrying   Psychosis:   Hallucinations; Affective flattening/alogia/avolition   Duration of Psychotic symptoms:  Duration of Psychotic Symptoms: Greater than six months   Trauma:   None   Obsessions:   None   Compulsions:   None   Inattention:   N/A   Hyperactivity/Impulsivity:   N/A   Oppositional/Defiant Behaviors:   N/A   Emotional Irregularity:   Chronic feelings of emptiness   Other Mood/Personality Symptoms:   depressed    Mental Status Exam Appearance and self-care  Stature:   Average   Weight:   Average weight   Clothing:   Disheveled   Grooming:   Normal   Cosmetic use:   Age appropriate   Posture/gait:   Bizarre   Motor activity:   Restless   Sensorium  Attention:   Distractible   Concentration:   Preoccupied   Orientation:   Person; Place; Object;  Time   Recall/memory:   Defective in Immediate   Affect and Mood  Affect:   Blunted; Flat   Mood:   Dysphoric; Hopeless   Relating  Eye contact:   Normal   Facial expression:   Sad   Attitude toward examiner:   Guarded; Suspicious; Resistant   Thought and Language  Speech flow:  Blocked   Thought content:   Suspicious   Preoccupation:   Guilt   Hallucinations:   Auditory   Organization:   Disorganized   Company secretary of Knowledge:   Average   Intelligence:   Average   Abstraction:   Functional   Judgement:   Impaired   Reality Testing:   Distorted   Insight:   Lacking   Decision Making:   Impulsive   Social Functioning  Social Maturity:   Impulsive   Social Judgement:   Impropriety; Naive   Stress  Stressors:   Housing; Relationship; Illness   Coping Ability:   Overwhelmed; Deficient supports   Skill Deficits:   Decision making; Self-care   Supports:   Support needed     Religion: Religion/Spirituality Are You A Religious Person?: No How Might This Affect Treatment?:  UTA  Leisure/Recreation: Leisure / Recreation Do You Have Hobbies?: Yes Leisure and Hobbies: Music, both listening to and making  Exercise/Diet: Exercise/Diet Do You Exercise?: No Have You Gained or Lost A Significant Amount of Weight in the Past Six Months?: No Do You Follow a Special Diet?: No Do You Have Any Trouble Sleeping?: Yes Explanation of Sleeping Difficulties: Pt reports that he sleeps excessivey night and day.   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[employer], Futures trader Job has Been Impacted by Current Illness: No Has Patient ever Been in Equities trader?: No  Education: Education Is Patient Currently Attending School?: No Last Grade Completed: 12 Did You Product manager?: Yes What Type of College Degree Do you Have?: did not finish Did You Have An Individualized Education Program (IIEP): No Did You Have Any Difficulty At School?: No Patient's Education Has Been Impacted by Current Illness: No   CCA Family/Childhood History Family and Relationship History: Family history Marital status: Single Does patient have children?: Yes How many children?: 1 How is patient's relationship with their children?: distant relationship, 57 year old lives with mother in [place]  Childhood History:  Childhood History By whom was/is the patient raised?: Mother Did patient suffer any verbal/emotional/physical/sexual abuse as a child?: No Did patient suffer from severe childhood neglect?: No Has patient ever been sexually abused/assaulted/raped as an adolescent or adult?: No Was the patient ever a victim of a crime or a disaster?: No Witnessed domestic violence?: No Has patient been affected by domestic violence as an adult?: No       CCA Substance Use Alcohol/Drug Use: Alcohol / Drug Use Pain Medications: SEE MAR Prescriptions: SEE MAR Over the Counter: SEE  MAR History of alcohol / drug use?: Yes Longest period of sobriety (when/how long): Pt reports occasional THC use, last use PTA and prior use was a month ago.  He denies other substance use.                         ASAM's:  Six Dimensions of Multidimensional Assessment  Dimension 1:  Acute Intoxication and/or Withdrawal Potential:      Dimension 2:  Biomedical Conditions and Complications:  Dimension 3:  Emotional, Behavioral, or Cognitive Conditions and Complications:     Dimension 4:  Readiness to Change:     Dimension 5:  Relapse, Continued use, or Continued Problem Potential:     Dimension 6:  Recovery/Living Environment:     ASAM Severity Score:    ASAM Recommended Level of Treatment:     Substance use Disorder (SUD)    Recommendations for Services/Supports/Treatments:    Discharge Disposition:    DSM5 Diagnoses: Patient Active Problem List   Diagnosis Date Noted   Schizophrenia, unspecified (HCC) 01/27/2022   Tobacco use disorder 01/27/2022   Cannabis use disorder 01/27/2022   Accidental drug overdose    Overdose of opiate or related narcotic, accidental or unintentional, initial encounter (HCC) 01/25/2022   Acute respiratory failure with hypoxia (HCC) 01/25/2022   Aspiration pneumonia (HCC) 01/25/2022   Transaminitis 01/25/2022   Amphetamine use disorder, severe (HCC)    Schizo affective schizophrenia (HCC) 09/01/2021   Schizophrenia (HCC) 08/07/2021   Brief psychotic disorder (HCC) 01/06/2021   Threatening to others 12/12/2020     Referrals to Alternative Service(s): Referred to Alternative Service(s):   Place:   Date:   Time:    Referred to Alternative Service(s):   Place:   Date:   Time:    Referred to Alternative Service(s):   Place:   Date:   Time:    Referred to Alternative Service(s):   Place:   Date:   Time:     Yetta Glassman, [facility]

## 2022-06-24 NOTE — Discharge Instructions (Addendum)
Please come to [facility] (this facility, SECOND FLOOR) during walk in hours for appointment with psychiatrist/provider for further medication management and for therapists for therapy.   Walk in hours for therapy/counseling: Monday through Thursday 8AM until slots are full. Every Friday 1PM-4PM.  Walk in hours for psychiatry/medication management: Monday through Friday 8AM-11AM.   When you arrive please go upstairs for your appointment. If you are unsure of where to go, inform the front desk that you are here for a walk in appointment and they will assist you with directions upstairs.  Address:  [address], in [place], [address] Ph: [phone]   

## 2022-06-24 NOTE — ED Provider Notes (Addendum)
Behavioral Health Urgent Care Medical Screening Exam  Patient Name: Jerry Garrison MRN: [id number] Date of Evaluation: 06/24/22 Chief Complaint: "nothing really" Diagnosis:  Final diagnoses:  Adjustment disorder with mixed anxiety and depressed mood   History of Present illness: Jerry Garrison is a 21 y.o. male. Pt presents voluntarily to [facility] behavioral health for walk-in assessment with police escort. Pt is assessed face-to-face by nurse practitioner.   Jerry Garrison, 21 y.o., male patient seen face to face by this provider, and chart reviewed on 06/24/22. Per chart review, pt with history of schizophrenia, substance abuse. On evaluation when asked reason for presenting today pt reports "nothing really". He reports his baby's mother and 16 year old child are living in [place]. He states that he was planning on a surprise visit to them. However, his baby's mother found out. Today, she told him not to come. He states he was in "disbelief". He feels "just lost", "don't feel all the way there". He reports feeling sad, depressed. He reports passive suicidal ideation, no plan or intent. He denies active suicidal, homicidal, or violent ideation, plan or intent. He reports he experienced auditory hallucinations today telling him "to get up". He states he just experienced auditory hallucinations of a male voice telling him to "get out". When asked what he thinks this means, he states he does not feel he needs to be at this facility.  Pt denies history of non suicidal self injurious behavior. He endorses history of suicidal ideation, although denies history of suicide attempt. He endorses history of inpatient psychiatric hospitalization. Per chart review, pt with inpatient psychiatric hospitalizations at [facility] from 01/27/2022-01/31/2022 and 08/07/21-08/15/21.  Pt reports he is using marijuana. He reports last use of marijuana today, about 1/5 of a blunt.   Pt denies he is  currently connected with medication management or counseling. Per chart review, medication list at discharge from [facility] from July 2023 admission included: -Amoxicillin-Pot Clavulanate 875-125 MG 1 tablet oral every 12 hours -Escitalopram Oxalate 5mg  oral daily -Hydroxyzine HCl 25mg  oral 3 times daily PRN -Nicotine 14mg  transdermal daily -Paliperidone palmitate 156mg  intramuscular every 28 days -Trazodone HCl 100mg  oral daily at bedtime Pt states he has not taken any psychiatric medications since July 2023.  Pt denies access to a firearm or other weapon.   Pt reports he is living in his .  Pt reports he is currently employed as a at . He is working part time.   Collateral obtained from pt's mother, [name], and pt's sister, August 2023, at [phone] with pt verbal consent. Academic librarian and [employer] state that to their knowledge pt is using marijuana, which includes edible and smoking. They state pt's marijuana use is concerning to them. Per [place] Nickel, pt made statement to his baby's mother today that "he was going to kill himself" because of her not allowing him to visit her. Pt's baby's mother was the one to call police. Sheralyn Boatman states pt is heartbroken and loves his baby's mother. She does not believe he was actually going to do anything.   Spoke with pt and pt's mother (on phone). Pt was asked about statement that "he was going to kill himself". Pt states he made this statement because he was "using manipulation". Pt's mother states pt is not living in her restaurant. He lives at home with her. She states that pt does nap at times in her office at work while she is working. She states pt has been doing well recently.  She does not want pt to have an inpatient admission. Pt also states he does not want an inpatient admission. Pt's mother states she does not have safety concerns with bringing pt home today. Pt's mother states she can pick pt  up from this facility and help maintain safety upon discharge.  Pt's mother arrived at facility with pt's sister to pick up pt. Discussed with pt's mother, pt's sister, and pt that if there is worsening suicidal ideation or safety concerns they can call 911/EMS, go to the nearest emergency room or crisis center. They verbalized understanding and agreed to do so. Pt's mother, pt's sister, and pt deny safety concerns with discharge today. Discussed recommendation for counseling and to consider medication management. Discussed open access hours at [facility] which were also provided in discharge paperwork.  Pt is a&ox3, in no acute distress, non-toxic appearing. He is casually dressed, fairly groomed, appropriate for environment. He makes minimal eye contact. His speech is slow, clear and coherent, with decreased volume. His reported mood is depressed. His affect is tearful, flat. Thought process is coherent with description of associations intact. Thought content is logical. He endorses auditory hallucinations of male voice telling him to "get out". Pt appears to have slowed, delayed responses. He appears under the influence. Pt does endorse use of marijuana prior to presentation. Pt's mother also states she believes pt is currently under the influence of marijuana.  Flowsheet Row ED from 06/24/2022 in [facility] (Discharged) [facility]osp-Admission (Discharged) from 01/24/2022 in [facility] EAST MEDICAL UNIT  C-SSRS RISK CATEGORY No Risk No Risk No Risk       Psychiatric Specialty Exam  Presentation  General Appearance:Casual; Fairly Groomed; Appropriate for Environment  Eye Contact:Minimal  Speech:Slow; Clear and Coherent  Speech Volume:Decreased  Handedness:Right   Mood and Affect  Mood: Depressed  Affect: Tearful; Flat   Thought Process  Thought  Processes: Coherent  Descriptions of Associations:Intact  Orientation:Full (Time, Place and Person)  Thought Content:Logical  Diagnosis of Schizophrenia or Schizoaffective disorder in past: Yes  Duration of Psychotic Symptoms: Greater than six months  Hallucinations:Auditory Reports male voice telling him "get out"  Ideas of Reference:None  Suicidal Thoughts:Yes, Passive Without Plan; Without Intent  Homicidal Thoughts:No   Sensorium  Memory: Immediate Fair  Judgment: Impaired  Insight: Shallow   Executive Functions  Concentration: Poor  Attention Span: Poor  Recall: [employer] of Knowledge: Fair  Language: Fair   Psychomotor Activity  Psychomotor Activity: Decreased   Assets  Assets: Communication Skills; Desire for Improvement; Housing; Resilience; Social Support; Vocational/Educational   Sleep  Sleep: Fair  Number of hours:  8   No data recorded  Physical Exam: Physical Exam Constitutional:      General: He is not in acute distress.    Appearance: Normal appearance. He is not ill-appearing, toxic-appearing or diaphoretic.  Eyes:     General: No scleral icterus. Cardiovascular:     Rate and Rhythm: Normal rate.  Pulmonary:     Effort: Pulmonary effort is normal. No respiratory distress.  Neurological:     Mental Status: He is alert and oriented to person, place, and time.  Psychiatric:        Attention and Perception: Attention and perception normal.        Mood and Affect: Mood is depressed. Affect is flat and tearful.        Speech: Speech  normal.        Behavior: Behavior is slowed. Behavior is cooperative.        Thought Content: Thought content normal.    Review of Systems  Constitutional:  Negative for chills and fever.  Respiratory:  Negative for shortness of breath.   Cardiovascular:  Negative for chest pain and palpitations.  Gastrointestinal:  Negative for abdominal pain.  Neurological:  Negative for  headaches.  Psychiatric/Behavioral:  Positive for depression.    Blood pressure 127/86, pulse 83, temperature 98.7 F (37.1 C), temperature source Oral, resp. rate 18, SpO2 99 %. There is no height or weight on file to calculate BMI.  Musculoskeletal: Strength & Muscle Tone: within normal limits Gait & Station: normal Patient leans: N/A  [facility] MSE Discharge Disposition for Follow up and Recommendations: Based on my evaluation the patient does not appear to have an emergency medical condition and can be discharged with resources and follow up care in outpatient services for Medication Management and Individual Therapy  Lauree Chandler, NP 06/24/2022, 4:32 PM

## 2022-06-24 NOTE — Discharge Summary (Signed)
Jerry Garrison to be D/C'd Home per NP order. An After Visit Summary was printed and given to the patient by provider. Patient escorted out and D/C home via private auto.  Jerry Garrison  06/24/2022 3:57 PM

## 2023-03-31 ENCOUNTER — Ambulatory Visit (HOSPITAL_COMMUNITY)
Admission: EM | Admit: 2023-03-31 | Discharge: 2023-03-31 | Disposition: A | Discharge status: Home / Self Care | Payer: BLUE CROSS/BLUE SHIELD

## 2023-03-31 DIAGNOSIS — Z91148 Patient's other noncompliance with medication regimen for other reason: Secondary | ICD-10-CM

## 2023-03-31 DIAGNOSIS — Z760 Encounter for issue of repeat prescription: Secondary | ICD-10-CM

## 2023-03-31 NOTE — ED Provider Notes (Signed)
Behavioral Health Urgent Care Medical Screening Exam  Patient Name: Jerry Garrison MRN: [id number] Date of Evaluation: 03/31/23 Chief Complaint:  " I need my medications" Diagnosis:  Final diagnoses:  Encounter for medication refill  Noncompliance with medication regimen    History of Present illness: Jerry Garrison is a 22 y.o. male. With psychiatric history of schizophrenia, brief psychotic disorder, polysubstance abuse, and accidental drug overdose who presented voluntarily as a walk-in to [facility] accompanied by mom and sister requesting medication refill for Depakote and olanzapine.   Patient was seen face-to-face by this provider and chart reviewed.  On evaluation, patient is alert, oriented x 4, and cooperative. Speech is clear, normal rate  coherent and logical. Pt appears well groomed. Eye contact is fair. Mood is anxious, affect is congruent with mood. Thought process is logical and thought content is coherent. Pt denies SI/HI/AVH. There is no objective indication that the patient is responding to internal stimuli. No delusions elicited during this assessment.    Patient reports " I need my medications refilled, Depakote and olanzapine, I stopped taking these meds a few weeks ago when I was in [place] , I stopped because I just wasn't like being responsible and I don't have an outpatient psychiatrist or Primary care provider".  Patient educated on the importance of compliance with his medication regimen to avoid decompensation.  Patient reports he lives with his mother and denies access to a gun or weapon.  Patient denies substance, and reports his sleep and appetite is fair.  Patient identifies his current stressor as not taking his medications now that he is back in town, stating," I worry I'm gonna have schizophrenic thoughts and get hurt".  Support, encouragement, and reassurance provided about ongoing stressors.  Patient is provided with opportunity for questions.  Discussed  recommendation to follow up with [facility] outpatient  walk in clinic in the am for medication management.  Patient verbalized his understanding and is in agreement.  [facility] Outpatient walk-in resources provided at discharge.   Flowsheet Row ED from 03/31/2023 in [facility] ED from 06/24/2022 in [facility] Admission (Discharged) from [facility]  C-SSRS RISK CATEGORY No Risk Moderate Risk No Risk       Psychiatric Specialty Exam  Presentation  General Appearance:Casual  Eye Contact:Good  Speech:Clear and Coherent  Speech Volume:Normal  Handedness:Right   Mood and Affect  Mood: Euthymic  Affect: Congruent   Thought Process  Thought Processes: Coherent; Goal Directed  Descriptions of Associations:Intact  Orientation:Full (Time, Place and Person)  Thought Content:WDL  Diagnosis of Schizophrenia or Schizoaffective disorder in past: Yes  Duration of Psychotic Symptoms: Greater than six months  Hallucinations:None Reports male voice telling him "get out"  Ideas of Reference:None  Suicidal Thoughts:No Without Plan; Without Intent  Homicidal Thoughts:No   Sensorium  Memory: Immediate Fair  Judgment: Poor  Insight: Shallow   Executive Functions  Concentration: Good  Attention Span: Good  Recall: Good  Fund of Knowledge: Good  Language: Good   Psychomotor Activity  Psychomotor Activity: Normal   Assets  Assets: Communication Skills; Desire for Improvement; Social Support   Sleep  Sleep: Fair  Number of hours:  8   Physical Exam: Physical Exam Constitutional:      General: He is not in acute distress.    Appearance: He is not diaphoretic.  HENT:     Head: Normocephalic.     Right Ear: External ear normal.  Left Ear: External ear normal.     Nose: No congestion.  Eyes:     General:        Right eye: No discharge.         Left eye: No discharge.  Cardiovascular:     Rate and Rhythm: Normal rate.  Pulmonary:     Effort: No respiratory distress.  Chest:     Chest wall: No tenderness.  Neurological:     Mental Status: He is alert and oriented to person, place, and time.  Psychiatric:        Attention and Perception: Attention and perception normal.        Mood and Affect: Mood and affect normal.        Speech: Speech normal.        Behavior: Behavior is cooperative.        Thought Content: Thought content normal. Thought content is not paranoid or delusional. Thought content does not include homicidal or suicidal ideation. Thought content does not include homicidal or suicidal plan.        Cognition and Memory: Cognition and memory normal.    Review of Systems  Constitutional:  Negative for chills, diaphoresis and fever.  HENT:  Negative for congestion.   Eyes:  Negative for discharge.  Respiratory:  Negative for cough, shortness of breath and wheezing.   Cardiovascular:  Negative for chest pain and palpitations.  Gastrointestinal:  Negative for nausea and vomiting.  Neurological:  Negative for tingling, seizures, loss of consciousness, weakness and headaches.  Psychiatric/Behavioral: Negative.     Blood pressure 120/72, pulse 83, temperature 99 F (37.2 C), temperature source Oral, resp. rate 20, SpO2 98%. There is no height or weight on file to calculate BMI.  Musculoskeletal: Strength & Muscle Tone: within normal limits Gait & Station: normal Patient leans: N/A   [facility] MSE Discharge Disposition for Follow up and Recommendations: Based on my evaluation the patient does not appear to have an emergency medical condition and can be discharged with resources and follow up care in outpatient services for Medication Management.   Patient provided with [facility] outpatient walk-in resources and encouraged to follow up.   Patient denies SI/HI/AVH. Patient does not meet inpatient psychiatric admission  criteria or IVC criteria at this time. There is no evidence of imminent risk of harm to self or others.  Discharge recommendations:  Patient is to take medications as prescribed. Please see information for follow-up appointment with psychiatry and therapy. Please follow up with your primary care provider for all medical related needs.   Therapy: We recommend that patient participate in individual therapy to address mental health concerns.  Medications: The patient or guardian is to contact a medical professional and/or outpatient provider to address any new side effects that develop. The patient or guardian should update outpatient providers of any new medications and/or medication changes.   Atypical antipsychotics: If you are prescribed an atypical antipsychotic, it is recommended that your height, weight, BMI, blood pressure, fasting lipid panel, and fasting blood sugar be monitored by your outpatient providers.  Safety:  The patient should abstain from use of illicit substances/drugs and abuse of any medications. If symptoms worsen or do not continue to improve or if the patient becomes actively suicidal or homicidal then it is recommended that the patient return to the closest hospital emergency department, the [facility] or call 911 for further evaluation and treatment. National Suicide Prevention Lifeline 1-800-SUICIDE or 681-431-6321.  About 988 988  offers 24/7 access to trained crisis counselors who can help people experiencing mental health-related distress. People can call or text 988 or chat 988lifeline.org for themselves or if they are worried about a loved one who may need crisis support.  Crisis Mobile: [facility]:                     [phone] (for crisis response 24 hours a day) [facility] Hotline:                                            269-643-9809   Patient discharged home in stable condition.    Mancel Bale, NP 03/31/2023, 10:36 PM

## 2023-03-31 NOTE — Progress Notes (Signed)
   03/31/23 2049  [facility] Triage Screening (Walk-ins at [facility] only)  How Did You Hear About [place]? Family/Friend  What Is the Reason for Your Visit/Call Today? Pt presents to [facility] voluntarily, accompanied by mom and sister requesting medication refill. Pt recently visited [place] and no longer has access to medicine he was prescribed. Pt reports being prescribed Depakote and Zyprexa and has not had medicines in a few weeks. Pt has history of schizophrenia. Pt denies SI,HI,AVH and substance/alcohol use.  How Long Has This Been Causing You Problems? <Week  Have You Recently Had Any Thoughts About Hurting Yourself? No  Are You Planning to Commit Suicide/Harm Yourself At This time? No  Have you Recently Had Thoughts About Hurting Someone [name]? No  Are You Planning To Harm Someone At This Time? No  Are you currently experiencing any auditory, visual or other hallucinations? No  Do you have any current medical co-morbidities that require immediate attention? No  Clinician description of patient physical appearance/behavior: pt is calm, cooperative, oriented  What Do You Feel Would Help You the Most Today? Medication(s)  If access to [facility] was not available, would you have sought care in the Emergency Department? No  Determination of Need Routine (7 days)  Options For Referral Other: Comment;Medication Management;Outpatient Therapy

## 2023-03-31 NOTE — Discharge Instructions (Signed)
Discharge recommendations:  Patient is to take medications as prescribed. Please see information for follow-up appointment with psychiatry and therapy. Please follow up with your primary care provider for all medical related needs.   Therapy: We recommend that patient participate in individual therapy to address mental health concerns.  Medications: The patient or guardian is to contact a medical professional and/or outpatient provider to address any new side effects that develop. The patient or guardian should update outpatient providers of any new medications and/or medication changes.   Atypical antipsychotics: If you are prescribed an atypical antipsychotic, it is recommended that your height, weight, BMI, blood pressure, fasting lipid panel, and fasting blood sugar be monitored by your outpatient providers.  Safety:  The patient should abstain from use of illicit substances/drugs and abuse of any medications. If symptoms worsen or do not continue to improve or if the patient becomes actively suicidal or homicidal then it is recommended that the patient return to the closest hospital emergency department, the [facility], or call 911 for further evaluation and treatment. National Suicide Prevention Lifeline 1-800-SUICIDE or 312-171-4513.  About 988 988 offers 24/7 access to trained crisis counselors who can help people experiencing mental health-related distress. People can call or text 988 or chat 988lifeline.org for themselves or if they are worried about a loved one who may need crisis support.  Crisis Mobile: [facility]:                     585-324-8031 (for crisis response 24 hours a day) [facility] Hotline:                                            503-757-0611.

## 2023-07-06 ENCOUNTER — Ambulatory Visit (HOSPITAL_COMMUNITY)
Admission: EM | Admit: 2023-07-06 | Discharge: 2023-07-06 | Disposition: A | Discharge status: Other Acute Inpatient Hospital | Payer: BLUE CROSS/BLUE SHIELD | Attending: Psychiatry | Admitting: Psychiatry

## 2023-07-06 DIAGNOSIS — Z560 Unemployment, unspecified: Secondary | ICD-10-CM | POA: Insufficient documentation

## 2023-07-06 DIAGNOSIS — T43506A Underdosing of unspecified antipsychotics and neuroleptics, initial encounter: Secondary | ICD-10-CM | POA: Insufficient documentation

## 2023-07-06 DIAGNOSIS — R462 Strange and inexplicable behavior: Secondary | ICD-10-CM

## 2023-07-06 DIAGNOSIS — F259 Schizoaffective disorder, unspecified: Secondary | ICD-10-CM | POA: Insufficient documentation

## 2023-07-06 DIAGNOSIS — R45851 Suicidal ideations: Secondary | ICD-10-CM | POA: Diagnosis present

## 2023-07-06 DIAGNOSIS — R4689 Other symptoms and signs involving appearance and behavior: Secondary | ICD-10-CM | POA: Diagnosis present

## 2023-07-06 DIAGNOSIS — F4320 Adjustment disorder, unspecified: Secondary | ICD-10-CM | POA: Insufficient documentation

## 2023-07-06 DIAGNOSIS — Z91128 Patient's intentional underdosing of medication regimen for other reason: Secondary | ICD-10-CM | POA: Insufficient documentation

## 2023-07-06 DIAGNOSIS — Z91148 Patient's other noncompliance with medication regimen for other reason: Secondary | ICD-10-CM

## 2023-07-06 LAB — POCT URINE DRUG SCREEN - MANUAL ENTRY (I-SCREEN)
POC Amphetamine UR: NOT DETECTED
POC Buprenorphine (BUP): NOT DETECTED
POC Cocaine UR: NOT DETECTED
POC Marijuana UR: NOT DETECTED
POC Methadone UR: NOT DETECTED
POC Methamphetamine UR: NOT DETECTED
POC Morphine: NOT DETECTED
POC Oxazepam (BZO): NOT DETECTED
POC Oxycodone UR: NOT DETECTED
POC Secobarbital (BAR): NOT DETECTED

## 2023-07-06 LAB — CBC WITH DIFFERENTIAL/PLATELET
Abs Immature Granulocytes: 0.01 10*3/uL (ref 0.00–0.07)
Basophils Absolute: 0 10*3/uL (ref 0.0–0.1)
Basophils Relative: 0 %
Eosinophils Absolute: 0 10*3/uL (ref 0.0–0.5)
Eosinophils Relative: 0 %
HCT: 45.4 % (ref 39.0–52.0)
Hemoglobin: 15 g/dL (ref 13.0–17.0)
Immature Granulocytes: 0 %
Lymphocytes Relative: 27 %
Lymphs Abs: 1.8 10*3/uL (ref 0.7–4.0)
MCH: 28.7 pg (ref 26.0–34.0)
MCHC: 33 g/dL (ref 30.0–36.0)
MCV: 86.8 fL (ref 80.0–100.0)
Monocytes Absolute: 0.9 10*3/uL (ref 0.1–1.0)
Monocytes Relative: 14 %
Neutro Abs: 3.9 10*3/uL (ref 1.7–7.7)
Neutrophils Relative %: 59 %
Platelets: 236 10*3/uL (ref 150–400)
RBC: 5.23 MIL/uL (ref 4.22–5.81)
RDW: 11.9 % (ref 11.5–15.5)
WBC: 6.6 10*3/uL (ref 4.0–10.5)
nRBC: 0 % (ref 0.0–0.2)

## 2023-07-06 LAB — COMPREHENSIVE METABOLIC PANEL
ALT: 18 U/L (ref 0–44)
AST: 20 U/L (ref 15–41)
Albumin: 4.6 g/dL (ref 3.5–5.0)
Alkaline Phosphatase: 53 U/L (ref 38–126)
Anion gap: 10 (ref 5–15)
BUN: 7 mg/dL (ref 6–20)
CO2: 25 mmol/L (ref 22–32)
Calcium: 9.6 mg/dL (ref 8.9–10.3)
Chloride: 101 mmol/L (ref 98–111)
Creatinine, Ser: 1.03 mg/dL (ref 0.61–1.24)
GFR, Estimated: >60 mL/min (ref >60)
Glucose, Bld: 85 mg/dL (ref 70–99)
Potassium: 3.3 mmol/L — ABNORMAL LOW (ref 3.5–5.1)
Sodium: 136 mmol/L (ref 135–145)
Total Bilirubin: 2.9 mg/dL — ABNORMAL HIGH (ref 0.0–1.2)
Total Protein: 7.4 g/dL (ref 6.5–8.1)

## 2023-07-06 LAB — ETHANOL: Alcohol, Ethyl (B): <10 mg/dL (ref <10)

## 2023-07-06 LAB — TSH: TSH: 3.214 u[IU]/mL (ref 0.350–4.500)

## 2023-07-06 MED ORDER — ZIPRASIDONE MESYLATE 20 MG IM SOLR
20.0000 mg | INTRAMUSCULAR | Status: AC | PRN
  Administered 2023-07-06: 20 mg via INTRAMUSCULAR
  Filled 2023-07-06: qty 20

## 2023-07-06 MED ORDER — PALIPERIDONE ER 3 MG PO TB24: 3.0000 mg | ORAL_TABLET | Freq: Every day | ORAL | Status: DC

## 2023-07-06 MED ORDER — POTASSIUM CHLORIDE CRYS ER 20 MEQ PO TBCR
40.0000 meq | EXTENDED_RELEASE_TABLET | Freq: Once | ORAL | Status: AC
  Administered 2023-07-06: 40 meq via ORAL
  Filled 2023-07-06: qty 2

## 2023-07-06 MED ORDER — PALIPERIDONE ER 3 MG PO TB24
3.0000 mg | ORAL_TABLET | Freq: Every day | ORAL | Status: DC
  Administered 2023-07-06: 3 mg via ORAL
  Filled 2023-07-06: qty 1

## 2023-07-06 MED ORDER — LORAZEPAM 1 MG PO TABS
1.0000 mg | ORAL_TABLET | ORAL | Status: DC | PRN
  Filled 2023-07-06: qty 1

## 2023-07-06 MED ORDER — OLANZAPINE 10 MG PO TBDP
10.0000 mg | ORAL_TABLET | Freq: Three times a day (TID) | ORAL | Status: DC | PRN
  Filled 2023-07-06: qty 1

## 2023-07-06 MED ORDER — MAGNESIUM HYDROXIDE 400 MG/5ML PO SUSP: 30.0000 mL | Freq: Every day | ORAL | Status: DC | PRN

## 2023-07-06 MED ORDER — NICOTINE 14 MG/24HR TD PT24: 14.0000 mg | MEDICATED_PATCH | Freq: Every day | TRANSDERMAL | 0 refills | Status: DC | PRN

## 2023-07-06 MED ORDER — NICOTINE 14 MG/24HR TD PT24
14.0000 mg | MEDICATED_PATCH | Freq: Every day | TRANSDERMAL | Status: DC | PRN
  Administered 2023-07-06: 14 mg via TRANSDERMAL
  Filled 2023-07-06: qty 1

## 2023-07-06 MED ORDER — ACETAMINOPHEN 325 MG PO TABS: 650.0000 mg | ORAL_TABLET | Freq: Four times a day (QID) | ORAL | Status: DC | PRN

## 2023-07-06 MED ORDER — ALUM & MAG HYDROXIDE-SIMETH 200-200-20 MG/5ML PO SUSP: 30.0000 mL | ORAL | Status: DC | PRN

## 2023-07-06 NOTE — ED Notes (Signed)
 Gave pt scrubs and bathroom supplies to take a shower.

## 2023-07-06 NOTE — ED Notes (Signed)
Pt awake , alert, cooperative. Continues to be paranoid. When asked assessment questions he answers, " I don't know". He voices understanding of transfer and is in agreement.Will continue to monitor for safety

## 2023-07-06 NOTE — ED Notes (Signed)
Patient resting with eyes closed. Respirations even and unlabored. No acute distress noted. Environment secured. Will continue to monitor for safety.

## 2023-07-06 NOTE — ED Notes (Signed)
Patient continues to respond to internal stimuli.  He continues to act paranoid by peering frequently around his surroundings.
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
## 2023-07-06 NOTE — ED Notes (Signed)
 Patient resting with eyes closed. Respirations even and unlabored. No acute distress noted. Environment secured. Will continue to monitor for safety.

## 2023-07-06 NOTE — ED Notes (Signed)
 The patient became more irritable and paranoid - thinking that there was a child out in the courtyard, so he was attempting to open the locked door.  Verbal de-escalation with offers of food and drink were unsuccessful in re-orienting patient to actual surroundings.  PRN medication administered with show of force from security.  Patient moved to flex in order to decrease stimuli and paranoia.
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
## 2023-07-06 NOTE — ED Notes (Signed)
 Patient A&Ox4. Declined to answer intent to harm self/others when asked. When asked if he would let staff know if thoughts/feelings of wanting to harm self/others or begins to have plan, the patient said I don't know, I might or I might not. Denies A/VH. Patient denies any physical complaints when asked. No acute distress noted. Support and encouragement provided. Routine safety checks conducted according to facility protocol. Encouraged patient to notify staff if thoughts of harm toward self or others arise. Will continue to monitor for safety.

## 2023-07-06 NOTE — ED Notes (Signed)
Pt observed/assessed in recliner sleeping. RR even and unlabored, appearing in no noted distress. Environmental check complete, will continue to monitor for safety 

## 2023-07-06 NOTE — ED Provider Notes (Signed)
 [facility] Continuous Assessment Admission H&P  Date: 07/06/23 Patient Name: Jerry Garrison MRN: [id number] Chief Complaint: passive suicidal ideation today  Diagnoses:  Final diagnoses:  H/O medication noncompliance  Bizarre behavior  Schizoaffective disorder, unspecified type (HCC)  Suicidal ideation    HPI: Jerry Garrison, 22 y/o male with a adjustment disorder, schizoaffective disorder, narcotic overdose, bizarre behavior, suicide attempt.  Presented to [facility] via [facility].  Patient does have an extensive history and also last admission for inpatient was in July 2024.  Patient also has a history of noncompliance with medication  Collaborate: per pat mother Jerry Garrison) pt girlfriend took his son and went to [place]  three days ago.  Pt been off his medication for 4 month,  he try to cutting  himself today,  he start to act funny and not himself. Per mom the patient is heart broken because he miss his son. According to pt mother she is not aware of him taking any drugs at this time.   Face-to-face evaluation of patient, patient is alert and oriented, maintained minimal eye contact.  Patient does appear to be anxious, does appear to be influenced by internal stimuli.  Does have bizarre behavior at times.  When talking to patient first patient stated he needed a second opinion when asked what he meant by second opinion patient become quite for a while then he takes patient a couple minutes before he will say anything again.  Patient reported he had suicide thoughts today to cut his wrist, also reported hearing voices telling him to hurt himself.  Patient denies HI, patient does deny smoking or illicit drug use recently.  Patient is not talkative and only speak at intervals, when asked a question patient will take up to a minute before he would answer.  Given patient past psychiatric history and presentation discussed with patient that it is recommended that he be admitted for overnight  observation and reassessment in the a.m.   Recommend inpatient observation  Total Time spent with patient: 20 minutes  Musculoskeletal  Strength & Muscle Tone: within normal limits Gait & Station: normal Patient leans: N/A  Psychiatric Specialty Exam  Presentation General Appearance:  Casual  Eye Contact: Fair  Speech: Blocked  Speech Volume: Decreased  Handedness: Right   Mood and Affect  Mood: Euthymic; Labile  Affect: Constricted   Thought Process  Thought Processes: Coherent  Descriptions of Associations:Intact  Orientation:Full (Time, Place and Person)  Thought Content:WDL  Diagnosis of Schizophrenia or Schizoaffective disorder in past: Yes  Duration of Psychotic Symptoms: Less than six months  Hallucinations:Hallucinations: Auditory Description of Auditory Hallucinations: voice telling him to go out an harm himself  Ideas of Reference:None  Suicidal Thoughts:Suicidal Thoughts: Yes, Passive SI Passive Intent and/or Plan: With Intent; With Plan  Homicidal Thoughts:Homicidal Thoughts: No   Sensorium  Memory: Immediate Fair  Judgment: Poor  Insight: Shallow   Executive Functions  Concentration: Fair  Attention Span: Fair  Recall: Fair  Fund of Knowledge: Fair  Language: Fair   Psychomotor Activity  Psychomotor Activity: Psychomotor Activity: Normal   Assets  Assets: Desire for Improvement; Resilience   Sleep  Sleep: Sleep: Fair Number of Hours of Sleep: 6   Nutritional Assessment (For OBS and FBC admissions only) Has the patient had a weight loss or gain of 10 pounds or more in the last 3 months?: No Has the patient had a decrease in food intake/or appetite?: No Does the patient have dental problems?: No Does the patient have eating  habits or behaviors that may be indicators of an eating disorder including binging or inducing vomiting?: No Has the patient recently lost weight without trying?: 0 Has the  patient been eating poorly because of a decreased appetite?: 0 Malnutrition Screening Tool Score: 0    Physical Exam HENT:     Head: Normocephalic.     Nose: Nose normal.  Eyes:     Pupils: Pupils are equal, round, and reactive to light.  Cardiovascular:     Rate and Rhythm: Normal rate.  Pulmonary:     Effort: Pulmonary effort is normal.  Musculoskeletal:        General: Normal range of motion.     Cervical back: Normal range of motion.  Neurological:     General: No focal deficit present.     Mental Status: He is alert.  Psychiatric:        Mood and Affect: Mood normal.        Behavior: Behavior normal.        Thought Content: Thought content normal.        Judgment: Judgment normal.    Review of Systems  Constitutional: Negative.   HENT: Negative.    Eyes: Negative.   Respiratory: Negative.    Cardiovascular: Negative.   Gastrointestinal: Negative.   Genitourinary: Negative.   Musculoskeletal: Negative.   Skin: Negative.   Neurological: Negative.   Psychiatric/Behavioral:  Positive for suicidal ideas. The patient is nervous/anxious.     Blood pressure 130/76, pulse 97, temperature 98.1 F (36.7 C), temperature source Oral, resp. rate 18, SpO2 100%. There is no height or weight on file to calculate BMI.  Past Psychiatric History: Adjustment disorder, schizoaffective disorder, suicidal ideation, medication noncompliance,  Is the patient at risk to self? Yes  Has the patient been a risk to self in the past 6 months? Yes .    Has the patient been a risk to self within the distant past? Yes   Is the patient a risk to others? No   Has the patient been a risk to others in the past 6 months? No   Has the patient been a risk to others within the distant past? No   Past Medical History: see chart  Family History: unknown  Social History: marijuana   Last Labs:  Admission on 07/06/2023  Component Date Value Ref Range Status   WBC 07/06/2023 6.6  4.0 - 10.5 K/uL  Final   RBC 07/06/2023 5.23  4.22 - 5.81 MIL/uL Final   Hemoglobin 07/06/2023 15.0  13.0 - 17.0 g/dL Final   HCT 87/68/7975 45.4  39.0 - 52.0 % Final   MCV 07/06/2023 86.8  80.0 - 100.0 fL Final   MCH 07/06/2023 28.7  26.0 - 34.0 pg Final   MCHC 07/06/2023 33.0  30.0 - 36.0 g/dL Final   RDW 87/68/7975 11.9  11.5 - 15.5 % Final   Platelets 07/06/2023 236  150 - 400 K/uL Final   nRBC 07/06/2023 0.0  0.0 - 0.2 % Final   Neutrophils Relative % 07/06/2023 59  % Final   Neutro Abs 07/06/2023 3.9  1.7 - 7.7 K/uL Final   Lymphocytes Relative 07/06/2023 27  % Final   Lymphs Abs 07/06/2023 1.8  0.7 - 4.0 K/uL Final   Monocytes Relative 07/06/2023 14  % Final   Monocytes Absolute 07/06/2023 0.9  0.1 - 1.0 K/uL Final   Eosinophils Relative 07/06/2023 0  % Final   Eosinophils Absolute 07/06/2023 0.0  0.0 - 0.5  K/uL Final   Basophils Relative 07/06/2023 0  % Final   Basophils Absolute 07/06/2023 0.0  0.0 - 0.1 K/uL Final   Immature Granulocytes 07/06/2023 0  % Final   Abs Immature Granulocytes 07/06/2023 0.01  0.00 - 0.07 K/uL Final   Performed at [facility], [address], [place], [place] [address]   Sodium 07/06/2023 136  135 - 145 mmol/L Final   Potassium 07/06/2023 3.3 (L)  3.5 - 5.1 mmol/L Final   Chloride 07/06/2023 101  98 - 111 mmol/L Final   CO2 07/06/2023 25  22 - 32 mmol/L Final   Glucose, Bld 07/06/2023 85  70 - 99 mg/dL Final   Glucose reference range applies only to samples taken after fasting for at least 8 hours.   BUN 07/06/2023 7  6 - 20 mg/dL Final   Creatinine, Ser 07/06/2023 1.03  0.61 - 1.24 mg/dL Final   Calcium 87/68/7975 9.6  8.9 - 10.3 mg/dL Final   Total Protein 87/68/7975 7.4  6.5 - 8.1 g/dL Final   Albumin 87/68/7975 4.6  3.5 - 5.0 g/dL Final   AST 87/68/7975 20  15 - 41 U/L Final   ALT 07/06/2023 18  0 - 44 U/L Final   Alkaline Phosphatase 07/06/2023 53  38 - 126 U/L Final   Total Bilirubin 07/06/2023 2.9 (H)  0.0 - 1.2 mg/dL Final   GFR, Estimated  07/06/2023 >60  >60 mL/min Final   Comment: (NOTE) Calculated using the CKD-EPI Creatinine Equation (2021)    Anion gap 07/06/2023 10  5 - 15 Final   Performed at [facility], [address], [place], [place] [address]   Alcohol, Ethyl (B) 07/06/2023 <10  <10 mg/dL Final   Comment: (NOTE) Lowest detectable limit for serum alcohol is 10 mg/dL.  For medical purposes only. Performed at [facility], [address], [place], [place] [address]    TSH 07/06/2023 3.214  0.350 - 4.500 uIU/mL Final   Comment: Performed by a 3rd Generation assay with a functional sensitivity of <=0.01 uIU/mL. Performed at [facility], [address], [place], [place] [address]    POC Amphetamine UR 07/06/2023 None Detected  NONE DETECTED (Cut Off Level 1000 ng/mL) Final   POC Secobarbital (BAR) 07/06/2023 None Detected  NONE DETECTED (Cut Off Level 300 ng/mL) Final   POC Buprenorphine (BUP) 07/06/2023 None Detected  NONE DETECTED (Cut Off Level 10 ng/mL) Final   POC Oxazepam (BZO) 07/06/2023 None Detected  NONE DETECTED (Cut Off Level 300 ng/mL) Final   POC Cocaine UR 07/06/2023 None Detected  NONE DETECTED (Cut Off Level 300 ng/mL) Final   POC Methamphetamine UR 07/06/2023 None Detected  NONE DETECTED (Cut Off Level 1000 ng/mL) Final   POC Morphine 07/06/2023 None Detected  NONE DETECTED (Cut Off Level 300 ng/mL) Final   POC Methadone UR 07/06/2023 None Detected  NONE DETECTED (Cut Off Level 300 ng/mL) Final   POC Oxycodone UR 07/06/2023 None Detected  NONE DETECTED (Cut Off Level 100 ng/mL) Final   POC Marijuana UR 07/06/2023 None Detected  NONE DETECTED (Cut Off Level 50 ng/mL) Final    Allergies: Latex  Medications:  Facility Ordered Medications  Medication   acetaminophen  (TYLENOL ) tablet 650 mg   alum & mag hydroxide-simeth (MAALOX/MYLANTA) 200-200-20 MG/5ML suspension 30 mL   magnesium  hydroxide (MILK OF MAGNESIA) suspension 30 mL   OLANZapine  zydis (ZYPREXA ) disintegrating  tablet 10 mg   And   LORazepam  (ATIVAN ) tablet  1 mg   And   ziprasidone  (GEODON ) injection 20 mg   PTA Medications  Medication Sig   escitalopram  (LEXAPRO ) 5 MG tablet Take 1 tablet (5 mg total) by mouth daily.   hydrOXYzine  (ATARAX ) 25 MG tablet Take 1 tablet (25 mg total) by mouth 3 (three) times daily as needed for anxiety.   nicotine  (NICODERM CQ  - DOSED IN MG/24 HOURS) 14 mg/24hr patch Place 1 patch (14 mg total) onto the skin daily. (May buy from over the counter): For smoking cessation.   traZODone  (DESYREL ) 100 MG tablet Take 1 tablet (100 mg total) by mouth at bedtime.   paliperidone  (INVEGA  SUSTENNA) 156 MG/ML SUSY injection Inject 1 mL (156 mg total) into the muscle every 28 (twenty-eight) days.      Medical Decision Making  Inpatient obsevation Lab Orders         SARS Coronavirus 2 by RT PCR (hospital order, performed in [facility] hospital lab) *cepheid single result test* Anterior Nasal Swab         CBC with Differential/Platelet         Comprehensive metabolic panel         Ethanol         TSH         POCT Urine Drug Screen - (I-Screen)     Meds ordered this encounter  Medications   acetaminophen  (TYLENOL ) tablet 650 mg   alum & mag hydroxide-simeth (MAALOX/MYLANTA) 200-200-20 MG/5ML suspension 30 mL   magnesium  hydroxide (MILK OF MAGNESIA) suspension 30 mL   AND Linked Order Group    OLANZapine  zydis (ZYPREXA ) disintegrating tablet 10 mg    LORazepam  (ATIVAN ) tablet 1 mg    ziprasidone  (GEODON ) injection 20 mg      Recommendations  Based on my evaluation the patient does not appear to have an emergency medical condition.  Gaither Pouch, NP 07/06/23  5:24 AM

## 2023-07-06 NOTE — Discharge Instructions (Addendum)
 You are being transferred to [facility] for a higher level of care that is better suited to address your medical needs. Our team has made arrangements for your transfer, and you will be under the care of the specialists at [facility] moving forward.  Instructions:  1. Please follow all medical recommendations and instructions provided by your care team prior to the transfer. 2. Bring all relevant medical documents, medications, and personal items with you. 3. Upon arrival at [facility], inform the staff about any allergies, medications, or special concerns you may have.

## 2023-07-06 NOTE — ED Notes (Signed)
Upon assessment, pt states he was listening to a  song that me made realized, he is problematic, and that is why he is here. Pt endorses SI, depression and anxiety, able to contract for safety. Will monitor for safety and provide support.

## 2023-07-06 NOTE — ED Notes (Signed)
Safe transport notified of need for transfer to [facility]

## 2023-07-06 NOTE — ED Provider Notes (Signed)
 FBC/OBS ASAP Discharge Summary  Date and Time: 07/06/2023 12:27 PM  Name: Jerry Garrison  MRN:  [id number]   Discharge Diagnoses:  Final diagnoses:  H/O medication noncompliance  Bizarre behavior  Schizoaffective disorder, unspecified type (HCC)  Suicidal ideation    Subjective:  Patient evaluated on the unit. He reports he reached out for help because he is unhappy with his life, he admits he was having suicidal ideations for the past 1-2 weeks accompanied by command auditory hallucinations telling him to hurt himself. He admits to having a plan to slit his throat with a knife. He also admits to attempting to cut his wrists. He has been having thoughts of jumping off a bridge. His son's mother went no contact around the time the SI started. He reports feeling a lot of guilt, he feels like he is not taking care of her or his 51 yo son.    He admits to suspecting ex-partner was being unfaithful when she wasn't. He admits he has a pattern of becoming  unreasoanbly suspicious and guarded towards others.    He reports that for the past 2 weeks he has been experiencing depression and anhedonia characterized by feelings of guilt,, worthlessness, poor sleep ( difficulty getting to sleep, sleeping on average 6 hours nightly which is not restful), appetite is unchanged, unsure of changes in concentration, increased fatigue, and psychomotor slowing.    He admits to currently experiencing HI towards his older sister, he had a plan of hurting/beating his sister with apparent intent.  He reports a strained relationship with her, but does not specify why they have not been getting along or if there was any inciting event that led to him developing these feelings towards her.   Patient lives in [place] with his mother and his younger sister. 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 He is single.When asked abut pending legal charges he states  I don't know, I think I had a restraining order placed  against me. He believes he has a court date but is not aware of the date. He admits to placing a fake police call to get custody back of him.    He reports being he has been noncompliant on his medications for the past 4 months, reports previously being on invega  sustenna and lexapro . He reports he stopped taking his   Patient had been living in [place]  since Jan and moved back to [facility] mid September when he disocvered he had a restraining order placed against him. The reports fleeing the state and leaving his medications there.    He denies any aduitory hallcuinations and command hallucinations driving his SI. He denies any VH.  He reports responding well to invega  when he was compliant in the past, confirms that this medication resolved his symptoms of psychosis and was a medication he tolerated well.   Patient reports he has not had outpatient follow up since returning to [place]  in September.   Substance Use Hx: Alcohol: Reports drinking ocassionally, no hx of abuse Tobacco: Reports vaping for the past 2 years, reports going through 1 cartridge every month Cannabis:Reports no recent use.  Cocaine: Denies   Stay Summary:  Jerry Garrison, 22 y/o male with a adjustment disorder, schizoaffective disorder, narcotic overdose, bizarre behavior, suicide attempt.  Presented to [facility] voluntarily via [facility].  Patient does have an extensive history and also last admission for inpatient was in July 2024.  Patient also has a history of noncompliance with medication  HPI on admission Collaborate: per pat mother Jerry Garrison) pt girlfriend took his son and went to [place]  three days ago.  Pt been off his medication for 4 month,  he try to cutting  himself today,  he start to act funny and not himself. Per mom the patient is heart broken because he miss his son. According to pt mother she is not aware of him taking any drugs at this time.    Face-to-face evaluation of patient, patient is alert and  oriented, maintained minimal eye contact.  Patient does appear to be anxious, does appear to be influenced by internal stimuli.  Does have bizarre behavior at times.  When talking to patient first patient stated he needed a second opinion when asked what he meant by second opinion patient become quite for a while then he takes patient a couple minutes before he will say anything again.  Patient reported he had suicide thoughts today to cut his wrist, also reported hearing voices telling him to hurt himself.  Patient denies HI, patient does deny smoking or illicit drug use recently.  Patient is not talkative and only speak at intervals, when asked a question patient will take up to a minute before he would answer.  Given patient past psychiatric history and presentation discussed with patient that it is recommended that he be admitted for overnight observation and reassessment in the a.m.   On day of discharge patient was restarted on Invega  3 mg p.o. daily, with plan to transition to LAI given history of medication noncompliance.  He was [facility] the behavioral health [facility].  Total Time spent with patient: 1 hour   Current Medications:  Current Facility-Administered Medications  Medication Dose Route Frequency Provider Last Rate Last Admin   acetaminophen  (TYLENOL ) tablet 650 mg  650 mg Oral Q6H PRN Trudy Carwin, NP       alum & mag hydroxide-simeth (MAALOX/MYLANTA) 200-200-20 MG/5ML suspension 30 mL  30 mL Oral Q4H PRN Trudy Carwin, NP       OLANZapine  zydis (ZYPREXA ) disintegrating tablet 10 mg  10 mg Oral Q8H PRN Trudy Carwin, NP       And   LORazepam  (ATIVAN ) tablet 1 mg  1 mg Oral PRN Trudy Carwin, NP       And   ziprasidone  (GEODON ) injection 20 mg  20 mg Intramuscular PRN Trudy Carwin, NP       magnesium  hydroxide (MILK OF MAGNESIA) suspension 30 mL  30 mL Oral Daily PRN Trudy Carwin, NP       nicotine  (NICODERM CQ  - dosed in mg/24 hours) patch 14 mg  14 mg Transdermal Daily PRN  Carrion-Carrero, Marlo, MD   14 mg at 07/06/23 1023   paliperidone  (INVEGA ) 24 hr tablet 3 mg  3 mg Oral Daily Carrion-Carrero, Raiyan Dalesandro, MD   3 mg at 07/06/23 1207   Current Outpatient Medications  Medication Sig Dispense Refill   escitalopram  (LEXAPRO ) 5 MG tablet Take 1 tablet (5 mg total) by mouth daily. (Patient not taking: Reported on 07/06/2023) 30 tablet 0   hydrOXYzine  (ATARAX ) 25 MG tablet Take 1 tablet (25 mg total) by mouth 3 (three) times daily as needed for anxiety. (Patient not taking: Reported on 07/06/2023) 30 tablet 0   nicotine  (NICODERM CQ  - DOSED IN MG/24 HOURS) 14 mg/24hr patch Place 1 patch (14 mg total) onto the skin daily. (May buy from over the counter): For smoking cessation. (Patient not taking: Reported on 07/06/2023) 1 patch 0   nicotine  (  NICODERM CQ  - DOSED IN MG/24 HOURS) 14 mg/24hr patch Place 1 patch (14 mg total) onto the skin daily as needed (NRT). 28 patch 0   [START ON 07/07/2023] paliperidone  (INVEGA ) 3 MG 24 hr tablet Take 1 tablet (3 mg total) by mouth daily.     traZODone  (DESYREL ) 100 MG tablet Take 1 tablet (100 mg total) by mouth at bedtime. (Patient not taking: Reported on 07/06/2023) 30 tablet 0    PTA Medications:  Facility Ordered Medications  Medication   acetaminophen  (TYLENOL ) tablet 650 mg   alum & mag hydroxide-simeth (MAALOX/MYLANTA) 200-200-20 MG/5ML suspension 30 mL   magnesium  hydroxide (MILK OF MAGNESIA) suspension 30 mL   OLANZapine  zydis (ZYPREXA ) disintegrating tablet 10 mg   And   LORazepam  (ATIVAN ) tablet 1 mg   And   ziprasidone  (GEODON ) injection 20 mg   [COMPLETED] potassium chloride  SA (KLOR-CON  M) CR tablet 40 mEq   paliperidone  (INVEGA ) 24 hr tablet 3 mg   nicotine  (NICODERM CQ  - dosed in mg/24 hours) patch 14 mg   PTA Medications  Medication Sig   escitalopram  (LEXAPRO ) 5 MG tablet Take 1 tablet (5 mg total) by mouth daily. (Patient not taking: Reported on 07/06/2023)   hydrOXYzine  (ATARAX ) 25 MG tablet Take 1 tablet  (25 mg total) by mouth 3 (three) times daily as needed for anxiety. (Patient not taking: Reported on 07/06/2023)   nicotine  (NICODERM CQ  - DOSED IN MG/24 HOURS) 14 mg/24hr patch Place 1 patch (14 mg total) onto the skin daily. (May buy from over the counter): For smoking cessation. (Patient not taking: Reported on 07/06/2023)   traZODone  (DESYREL ) 100 MG tablet Take 1 tablet (100 mg total) by mouth at bedtime. (Patient not taking: Reported on 07/06/2023)   [START ON 07/07/2023] paliperidone  (INVEGA ) 3 MG 24 hr tablet Take 1 tablet (3 mg total) by mouth daily.       06/24/2022    4:34 PM 10/23/2021    6:29 PM 09/01/2021   11:03 AM  Depression screen PHQ 2/9  Decreased Interest 2 2 2   Down, Depressed, Hopeless 2 2 2   PHQ - 2 Score 4 4 4   Altered sleeping 2 3 0  Tired, decreased energy 1 2 0  Change in appetite 1 2 1   Feeling bad or failure about yourself  1 2 3   Trouble concentrating 1 2 0  Moving slowly or fidgety/restless 0 2 2  Suicidal thoughts 1 0 1  PHQ-9 Score 11 17 11   Difficult doing work/chores Somewhat difficult Very difficult Somewhat difficult    Flowsheet Row ED from 07/06/2023 in [facility] ED from 03/31/2023 in [facility] ED from 06/24/2022 in [facility]  C-SSRS RISK CATEGORY High Risk No Risk Moderate Risk       Musculoskeletal  Strength & Muscle Tone: within normal limits Gait & Station: normal Patient leans: N/A  Psychiatric Specialty Exam  Presentation  General Appearance:  Casual; Fairly Groomed  Eye Contact: Poor  Speech: Normal Rate; Blocked (pacutiy of speech noted)  Speech Volume: Decreased  Handedness: -- (not assessed)   Mood and Affect  Mood: -- (Sad)  Affect: Inappropriate; Non-Congruent (smiles innaprorpiately throughout interview)   Thought Process  Thought Processes: Linear  Descriptions of Associations:Intact  Orientation:Full  (Time, Place and Person)  Thought Content:Logical  Diagnosis of Schizophrenia or Schizoaffective disorder in past: Yes  Duration of Psychotic Symptoms: Greater than six months   Hallucinations:Hallucinations: None Description of Auditory Hallucinations:  voice telling him to go out an harm himself  Ideas of Reference:None  Suicidal Thoughts:Suicidal Thoughts: No SI Passive Intent and/or Plan: With Intent; With Plan  Homicidal Thoughts:Homicidal Thoughts: No   Sensorium  Memory: Immediate Fair; Recent Fair; Remote Fair  Judgment: Fair  Insight: Fair; Lacking   Executive Functions  Concentration: Fair  Attention Span: Fair  Recall: [employer] of Knowledge: Fair  Language: Fair   Psychomotor Activity  Psychomotor Activity: Psychomotor Activity: Normal   Assets  Assets: Desire for Improvement; Resilience; Communication Skills   Sleep  Sleep: Sleep: Good Number of Hours of Sleep: 6   Nutritional Assessment (For OBS and FBC admissions only) Has the patient had a weight loss or gain of 10 pounds or more in the last 3 months?: No Has the patient had a decrease in food intake/or appetite?: No Does the patient have dental problems?: No Does the patient have eating habits or behaviors that may be indicators of an eating disorder including binging or inducing vomiting?: No Has the patient recently lost weight without trying?: 0 Has the patient been eating poorly because of a decreased appetite?: 0 Malnutrition Screening Tool Score: 0    Physical Exam  Physical Exam Vitals and nursing note reviewed.  Constitutional:      General: He is not in acute distress.    Appearance: He is not ill-appearing.  HENT:     Head: Normocephalic and atraumatic.  Eyes:     Extraocular Movements: Extraocular movements intact.  Pulmonary:     Effort: Pulmonary effort is normal. No respiratory distress.  Musculoskeletal:        General: Normal range of motion.   Skin:    General: Skin is warm and dry.  Neurological:     General: No focal deficit present.     Mental Status: He is alert.    Review of Systems  All other systems reviewed and are negative.  Blood pressure (!) 115/90, pulse 93, temperature 97.7 F (36.5 C), temperature source Oral, resp. rate 18, SpO2 100%. There is no height or weight on file to calculate BMI.  Demographic Factors:  Male  Loss Factors: NA  Historical Factors: Prior suicide attempts  Risk Reduction Factors:   Employed  Continued Clinical Symptoms:  Currently Psychotic Unstable or Poor Therapeutic Relationship  Cognitive Features That Contribute To Risk:  None    Suicide Risk:  Moderate:  Frequent suicidal ideation with limited intensity, and duration, some specificity in terms of plans, no associated intent, good self-control, limited dysphoria/symptomatology, some risk factors present, and identifiable protective factors, including available and accessible social support.  Plan Of Care/Follow-up recommendations:  Activity: as tolerated  Diet: heart healthy  Other: -Follow-up with your outpatient psychiatric provider -instructions on appointment date, time, and address (location) are provided to you in discharge paperwork.  -Take your psychiatric medications as prescribed at discharge - instructions are provided to you in the discharge paperwork  -If you are prescribed an atypical antipsychotic medication, we recommend that your outpatient psychiatrist follow routine screening for side effects within 3 months of discharge, including monitoring: AIMS scale, height, weight, blood pressure, fasting lipid panel, HbA1c, and fasting blood sugar.   -Recommend total abstinence from alcohol, tobacco, and other illicit drug use at discharge.   -If your psychiatric symptoms recur, worsen, or if you have side effects to your psychiatric medications, call your outpatient psychiatric provider, 911, [phone] or go  to the nearest emergency department.  -If suicidal thoughts occur, immediately  call your outpatient psychiatric provider, 911, [phone] or go to the nearest emergency department.   Disposition: transfer to [facility] for psychiatric stabalization  Marlo Masson, MD 07/06/2023, 12:27 PM

## 2023-07-06 NOTE — Progress Notes (Signed)
 Pt has been accepted to [facility] on 07/06/2023  TODAY Bed assignment: 305-1  Pt meets inpatient criteria per: Marlo Etienne MD  Attending Physician will be: Rankin Montes, MD   Report can be called to: Adult unit: [phone]  Pt can arrive after [facility] CONSENT   Care Team Notified: [facility] Cherylynn Ernst RN, Ireti Omoseebi RN Marlo Etienne MD, Beulah Guan LPN   [place] Kamelia Lampkins LCSW-A   07/06/2023 11:54 AM

## 2023-07-06 NOTE — ED Notes (Signed)
 Patient is currently walking back and forth on unit talking with peers, patient appears to be in no acute distress, respirations are even and unlabored.

## 2023-07-06 NOTE — ED Notes (Signed)
Report called to RN @ [facility] 

## 2023-07-07 ENCOUNTER — Other Ambulatory Visit: Payer: Self-pay

## 2023-07-07 ENCOUNTER — Encounter (HOSPITAL_COMMUNITY): Payer: Self-pay | Admitting: Student in an Organized Health Care Education/Training Program

## 2023-07-07 ENCOUNTER — Encounter (HOSPITAL_COMMUNITY): Payer: Self-pay

## 2023-07-07 ENCOUNTER — Inpatient Hospital Stay (HOSPITAL_COMMUNITY)
Admission: AD | Admit: 2023-07-07 | Discharge: 2023-07-12 | DRG: 885 | Disposition: A | Discharge status: Home / Self Care | Payer: BLUE CROSS/BLUE SHIELD | Source: Intra-hospital | Attending: Psychiatry | Admitting: Psychiatry

## 2023-07-07 DIAGNOSIS — F419 Anxiety disorder, unspecified: Secondary | ICD-10-CM | POA: Diagnosis present

## 2023-07-07 DIAGNOSIS — Z9151 Personal history of suicidal behavior: Secondary | ICD-10-CM

## 2023-07-07 DIAGNOSIS — F1520 Other stimulant dependence, uncomplicated: Secondary | ICD-10-CM | POA: Diagnosis present

## 2023-07-07 DIAGNOSIS — R45851 Suicidal ideations: Secondary | ICD-10-CM | POA: Diagnosis present

## 2023-07-07 DIAGNOSIS — F17210 Nicotine dependence, cigarettes, uncomplicated: Secondary | ICD-10-CM | POA: Diagnosis present

## 2023-07-07 DIAGNOSIS — F203 Undifferentiated schizophrenia: Secondary | ICD-10-CM | POA: Diagnosis not present

## 2023-07-07 DIAGNOSIS — Z79899 Other long term (current) drug therapy: Secondary | ICD-10-CM | POA: Diagnosis not present

## 2023-07-07 DIAGNOSIS — G4700 Insomnia, unspecified: Secondary | ICD-10-CM | POA: Diagnosis present

## 2023-07-07 DIAGNOSIS — F259 Schizoaffective disorder, unspecified: Secondary | ICD-10-CM | POA: Diagnosis present

## 2023-07-07 DIAGNOSIS — F209 Schizophrenia, unspecified: Principal | ICD-10-CM | POA: Diagnosis present

## 2023-07-07 LAB — LIPID PANEL
Cholesterol: 117 mg/dL (ref 0–200)
HDL: 56 mg/dL (ref >40)
LDL Cholesterol: 57 mg/dL (ref 0–99)
Total CHOL/HDL Ratio: 2.1 {ratio}
Triglycerides: 22 mg/dL (ref <150)
VLDL: 4 mg/dL (ref 0–40)

## 2023-07-07 LAB — TSH: TSH: 2.114 u[IU]/mL (ref 0.350–4.500)

## 2023-07-07 MED ORDER — HALOPERIDOL 5 MG PO TABS
5.0000 mg | ORAL_TABLET | Freq: Three times a day (TID) | ORAL | Status: DC | PRN
Start: 2023-07-07 — End: 2023-07-12
  Administered 2023-07-08: 5 mg via ORAL

## 2023-07-07 MED ORDER — DIPHENHYDRAMINE HCL 25 MG PO CAPS: 25.0000 mg | ORAL_CAPSULE | Freq: Four times a day (QID) | ORAL | Status: DC | PRN

## 2023-07-07 MED ORDER — HYDROXYZINE HCL 25 MG PO TABS
25.0000 mg | ORAL_TABLET | Freq: Three times a day (TID) | ORAL | Status: DC | PRN
Start: 2023-07-07 — End: 2023-07-12
  Administered 2023-07-07: 25 mg via ORAL
  Filled 2023-07-07: qty 1

## 2023-07-07 MED ORDER — NICOTINE POLACRILEX 2 MG MT GUM: 2.0000 mg | CHEWING_GUM | OROMUCOSAL | Status: DC | PRN

## 2023-07-07 MED ORDER — DIPHENHYDRAMINE HCL 25 MG PO CAPS: 50.0000 mg | ORAL_CAPSULE | Freq: Three times a day (TID) | ORAL | Status: DC | PRN

## 2023-07-07 MED ORDER — TRAZODONE HCL 50 MG PO TABS
50.0000 mg | ORAL_TABLET | Freq: Every evening | ORAL | Status: DC | PRN
  Administered 2023-07-07 – 2023-07-08 (×2): 50 mg via ORAL
  Filled 2023-07-07 (×2): qty 1

## 2023-07-07 MED ORDER — PALIPERIDONE ER 3 MG PO TB24
3.0000 mg | ORAL_TABLET | Freq: Every day | ORAL | Status: DC
  Administered 2023-07-07: 3 mg via ORAL
  Filled 2023-07-07 (×2): qty 1

## 2023-07-07 MED ORDER — PALIPERIDONE ER 3 MG PO TB24
3.0000 mg | ORAL_TABLET | Freq: Every day | ORAL | Status: AC
  Administered 2023-07-07: 3 mg via ORAL
  Filled 2023-07-07: qty 1

## 2023-07-07 MED ORDER — LORAZEPAM 2 MG/ML IJ SOLN: 2.0000 mg | Freq: Three times a day (TID) | INTRAMUSCULAR | Status: DC | PRN

## 2023-07-07 MED ORDER — ALUM & MAG HYDROXIDE-SIMETH 200-200-20 MG/5ML PO SUSP: 30.0000 mL | ORAL | Status: DC | PRN

## 2023-07-07 MED ORDER — PALIPERIDONE ER 6 MG PO TB24
6.0000 mg | ORAL_TABLET | Freq: Every day | ORAL | Status: DC
  Filled 2023-07-07: qty 1

## 2023-07-07 MED ORDER — HALOPERIDOL LACTATE 5 MG/ML IJ SOLN: 5.0000 mg | Freq: Three times a day (TID) | INTRAMUSCULAR | Status: DC | PRN

## 2023-07-07 MED ORDER — ACETAMINOPHEN 325 MG PO TABS: 650.0000 mg | ORAL_TABLET | Freq: Four times a day (QID) | ORAL | Status: DC | PRN

## 2023-07-07 MED ORDER — NICOTINE 14 MG/24HR TD PT24
14.0000 mg | MEDICATED_PATCH | Freq: Every day | TRANSDERMAL | Status: DC
  Administered 2023-07-08 – 2023-07-11 (×4): 14 mg via TRANSDERMAL
  Filled 2023-07-07 (×7): qty 1

## 2023-07-07 MED ORDER — DIPHENHYDRAMINE HCL 50 MG/ML IJ SOLN: 50.0000 mg | Freq: Three times a day (TID) | INTRAMUSCULAR | Status: DC | PRN

## 2023-07-07 MED ORDER — BACITRACIN-NEOMYCIN-POLYMYXIN OINTMENT TUBE
TOPICAL_OINTMENT | Freq: Two times a day (BID) | CUTANEOUS | Status: DC
Start: 2023-07-07 — End: 2023-07-12
  Filled 2023-07-07: qty 14.17

## 2023-07-07 MED ORDER — HALOPERIDOL LACTATE 5 MG/ML IJ SOLN: 10.0000 mg | Freq: Three times a day (TID) | INTRAMUSCULAR | Status: DC | PRN

## 2023-07-07 MED ORDER — MAGNESIUM HYDROXIDE 400 MG/5ML PO SUSP: 30.0000 mL | Freq: Every day | ORAL | Status: DC | PRN

## 2023-07-07 NOTE — [facility] Counselor (Signed)
 Adult Comprehensive Assessment  Patient ID: Jerry Garrison, male   DOB: [birth year], 23 y.o.   MRN: [id number]  Information Source: Information source: Patient (PSA completed with pt)  Current Stressors:  Patient states their primary concerns and needs for treatment are::  ...feeling like I was the cause of slavery, it all came to me in a song Patient states their goals for this hospitilization and ongoing recovery are::  ... just to listen Educational / Learning stressors:  I don't know Employment / Job issues: ... I am a sluggard, I don't work Family Relationships:  yes, me and my son's mother are not together, we are not talking Financial / Lack of resources (include bankruptcy): yes, I do not have job nor any money Housing / Lack of housing:  yes, my mother will pick me up Physical health (include injuries & life threatening diseases): None reported Social relationships:  yes, I am a shy guy Substance abuse:  no, well I vape nicotine  Bereavement / Loss: None reported  Living/Environment/Situation:  Living Arrangements: Parent Living conditions (as described by patient or guardian):  I live with my mother Who else lives in the home?:  my mother and I How long has patient lived in current situation?:  I have lived with my mother in younger life but I moved back with her in 2020 What is atmosphere in current home: Comfortable, Paramedic  Family History:  Marital status: Single Are you sexually active?: No What is your sexual orientation?: Straight Has your sexual activity been affected by drugs, alcohol, medication, or emotional stress?: no Does patient have children?: Yes How many children?: 1 How is patient's relationship with their children?: nonexcistent  Childhood History:  By whom was/is the patient raised?: Mother Additional childhood history information: Patient states that he had a great childhood and had lots of siblings. Patient states that he was  free to do what he wants Description of patient's relationship with caregiver when they were a child: I think it was good How were you disciplined when you got in trouble as a child/adolescent?: Patient states that he rarely was in trouble- wasn't disciplined much Does patient have siblings?: Yes Number of Siblings: 9 Description of patient's current relationship with siblings: patient reports that his relationship right now is distant with siblings Did patient suffer any verbal/emotional/physical/sexual abuse as a child?: Yes Did patient suffer from severe childhood neglect?: No Has patient ever been sexually abused/assaulted/raped as an adolescent or adult?: No Was the patient ever a victim of a crime or a disaster?: No Witnessed domestic violence?: No Has patient been affected by domestic violence as an adult?: No  Education:  Highest grade of school patient has completed: 12th with 1 yr of college Currently a student?: No Learning disability?: No  Employment/Work Situation:   Employment Situation: Unemployed Patient's Job has Been Impacted by Current Illness: No What is the Longest Time Patient has Held a Job?: 2 years Where was the Patient Employed at that Time?: [employer] Has Patient ever Been in the [employer]?: No  Financial Resources:   Financial resources: No income Does patient have a lawyer or guardian?: No  Alcohol/Substance Abuse:   What has been your use of drugs/alcohol within the last 12 months?:  yes,  I smoke tobacco If attempted suicide, did drugs/alcohol play a role in this?: No Alcohol/Substance Abuse Treatment Hx: Denies past history If yes, describe treatment: na Has alcohol/substance abuse ever caused legal problems?: No  Social Support System:   [employer]  Support System: [employer] Exhibitions Officer System:  none Type of faith/religion:  I am a Christian How does patient's faith help to cope with current illness?:  I still  pray  Leisure/Recreation:   Do You Have Hobbies?: Yes Leisure and Hobbies: listening to music  Strengths/Needs:   What is the patient's perception of their strengths?:  I think I am easy to get long with Patient states these barriers may affect/interfere with their treatment:  no barriers Patient states these barriers may affect their return to the community:  no barriers Other important information patient would like considered in planning for their treatment: na  Discharge Plan:   Currently receiving community mental health services: No Patient states concerns and preferences for aftercare planning are:  I would like to have a therapy and some one to manage my meds Patient states they will know when they are safe and ready for discharge when:  when my medications are working Does patient have access to transportation?: Yes Does patient have financial barriers related to discharge medications?: No Patient description of barriers related to discharge medications:  no barriers Will patient be returning to same living situation after discharge?: Yes  Summary/Recommendations:   Summary and Recommendations (to be completed by the evaluator): Jerry Garrison is a 23 year old male voluntarily admitted to [facility] after presenting to [facility] due to SI with plan to cut wrist. Pt reported stressors as estranged relationship with his girlfriend, not able to see his child, strained relationship with his mother, unemployment and possible issue with housing (however pt.'s mother reports he can return home. Pt denies SI/HI but per interview pt. experiencing delusions, "I am very sad and depressed because I am the cause of slavery". Per chart review pt. has a diagnosis of Schizoaffective Disorder and Amphetamine use disorder, severe. Pt has had four inpatient psychiatric admissions.  Pt would benefit from crisis stabilization, medication management, therapeutic milieu, and referrals for services.  Jerry Garrison  R. 07/07/2023

## 2023-07-07 NOTE — Progress Notes (Signed)
   07/07/23 1000  Psych Admission Type (Psych Patients Only)  Admission Status Voluntary  Psychosocial Assessment  Patient Complaints Depression  Eye Contact Fair  Facial Expression Animated  Affect Appropriate to circumstance  Speech Logical/coherent  Interaction Assertive  Motor Activity Fidgety  Appearance/Hygiene Unremarkable  Behavior Characteristics Cooperative  Mood Depressed;Anxious  Thought Process  Coherency WDL  Content WDL  Delusions None reported or observed  Perception WDL  Hallucination None reported or observed  Judgment Poor  Confusion None  Danger to Self  Current suicidal ideation? Denies  Danger to Others  Danger to Others None reported or observed

## 2023-07-07 NOTE — Group Note (Signed)
 Date:  07/07/2023 Time:  12:01 PM  Group Topic/Focus:  Wellness Toolbox:   The focus of this group is to discuss various aspects of wellness, balancing those aspects and exploring ways to increase the ability to experience wellness.  Patients will create a wellness toolbox for use upon discharge.    Participation Level:  Active  Participation Quality:  Appropriate  Affect:  Appropriate  Cognitive:  Appropriate  Insight: Appropriate  Engagement in Group:  Engaged  Modes of Intervention:  Education  Additional Comments:     Jerry Garrison [name] 07/07/2023, 12:01 PM

## 2023-07-07 NOTE — Group Note (Signed)
 Date:  07/07/2023 Time:  11:47 AM  Group Topic/Focus:  Orientation:   The focus of this group is to educate the patient on the purpose and policies of crisis stabilization and provide a format to answer questions about their admission.  The group details unit policies and expectations of patients while admitted.    Participation Level:  Active  Participation Quality:  Appropriate  Affect:  Appropriate  Cognitive:  Appropriate  Insight: Appropriate  Engagement in Group:  Engaged  Modes of Intervention:  Discussion  Additional Comments:     [place] [name] 07/07/2023, 11:47 AM

## 2023-07-07 NOTE — BH IP Treatment Plan (Signed)
 Interdisciplinary Treatment and Diagnostic Plan Update  07/07/2023 Time of Session: 10:50 AM Davinci Glotfelty MRN: [id number]  Principal Diagnosis: Schizophrenia, unspecified (HCC)  Secondary Diagnoses: Principal Problem:   Schizophrenia, unspecified (HCC) Active Problems:   Amphetamine use disorder, severe (HCC)   Current Medications:  Current Facility-Administered Medications  Medication Dose Route Frequency Provider Last Rate Last Admin   acetaminophen  (TYLENOL ) tablet 650 mg  650 mg Oral Q6H PRN Carrion-Carrero, Margely, MD       alum & mag hydroxide-simeth (MAALOX/MYLANTA) 200-200-20 MG/5ML suspension 30 mL  30 mL Oral Q4H PRN Carrion-Carrero, Margely, MD       diphenhydrAMINE  (BENADRYL ) capsule 25 mg  25 mg Oral Q6H PRN Carrion-Carrero, Margely, MD       haloperidol  (HALDOL ) tablet 5 mg  5 mg Oral TID PRN Carrion-Carrero, Margely, MD       And   diphenhydrAMINE  (BENADRYL ) capsule 50 mg  50 mg Oral TID PRN Carrion-Carrero, Margely, MD       haloperidol  lactate (HALDOL ) injection 5 mg  5 mg Intramuscular TID PRN Carrion-Carrero, Margely, MD       And   diphenhydrAMINE  (BENADRYL ) injection 50 mg  50 mg Intramuscular TID PRN Carrion-Carrero, Margely, MD       And   LORazepam  (ATIVAN ) injection 2 mg  2 mg Intramuscular TID PRN Carrion-Carrero, Margely, MD       haloperidol  lactate (HALDOL ) injection 10 mg  10 mg Intramuscular TID PRN Carrion-Carrero, Margely, MD       And   diphenhydrAMINE  (BENADRYL ) injection 50 mg  50 mg Intramuscular TID PRN Carrion-Carrero, Margely, MD       And   LORazepam  (ATIVAN ) injection 2 mg  2 mg Intramuscular TID PRN Carrion-Carrero, Margely, MD       hydrOXYzine  (ATARAX ) tablet 25 mg  25 mg Oral TID PRN Carrion-Carrero, Margely, MD       magnesium  hydroxide (MILK OF MAGNESIA) suspension 30 mL  30 mL Oral Daily PRN Carrion-Carrero, Margely, MD       neomycin -bacitracin -polymyxin (NEOSPORIN) ointment   Topical BID Massengill, Nathan, MD       nicotine   (NICODERM CQ  - dosed in mg/24 hours) patch 14 mg  14 mg Transdermal Daily Massengill, Nathan, MD       nicotine  polacrilex (NICORETTE ) gum 2 mg  2 mg Oral PRN Massengill, Rankin, MD       paliperidone  (INVEGA ) 24 hr tablet 3 mg  3 mg Oral QHS Massengill, Nathan, MD       NOREEN ON 07/08/2023] paliperidone  (INVEGA ) 24 hr tablet 6 mg  6 mg Oral QHS Massengill, Nathan, MD       traZODone  (DESYREL ) tablet 50 mg  50 mg Oral QHS PRN Carrion-Carrero, Margely, MD       PTA Medications: Medications Prior to Admission  Medication Sig Dispense Refill Last Dose/Taking   escitalopram  (LEXAPRO ) 5 MG tablet Take 1 tablet (5 mg total) by mouth daily. (Patient not taking: Reported on 07/06/2023) 30 tablet 0    hydrOXYzine  (ATARAX ) 25 MG tablet Take 1 tablet (25 mg total) by mouth 3 (three) times daily as needed for anxiety. (Patient not taking: Reported on 07/06/2023) 30 tablet 0    nicotine  (NICODERM CQ  - DOSED IN MG/24 HOURS) 14 mg/24hr patch Place 1 patch (14 mg total) onto the skin daily. (May buy from over the counter): For smoking cessation. (Patient not taking: Reported on 07/06/2023) 1 patch 0    nicotine  (NICODERM CQ  - DOSED IN MG/24 HOURS) 14 mg/24hr  patch Place 1 patch (14 mg total) onto the skin daily as needed (NRT). 28 patch 0    paliperidone  (INVEGA ) 3 MG 24 hr tablet Take 1 tablet (3 mg total) by mouth daily.      traZODone  (DESYREL ) 100 MG tablet Take 1 tablet (100 mg total) by mouth at bedtime. (Patient not taking: Reported on 07/06/2023) 30 tablet 0     Patient Stressors:    Patient Strengths:    Treatment Modalities: Medication Management, Group therapy, Case management,  1 to 1 session with clinician, Psychoeducation, Recreational therapy.   Physician Treatment Plan for Primary Diagnosis: Schizophrenia, unspecified (HCC) Long Term Goal(s): Improvement in symptoms so as ready for discharge   Short Term Goals: Ability to identify changes in lifestyle to reduce recurrence of condition will  improve Ability to verbalize feelings will improve Ability to disclose and discuss suicidal ideas Ability to demonstrate self-control will improve Ability to identify and develop effective coping behaviors will improve Ability to maintain clinical measurements within normal limits will improve Compliance with prescribed medications will improve Ability to identify triggers associated with substance abuse/mental health issues will improve  Medication Management: Evaluate patient's response, side effects, and tolerance of medication regimen.  Therapeutic Interventions: 1 to 1 sessions, Unit Group sessions and Medication administration.  Evaluation of Outcomes: Not Progressing  Physician Treatment Plan for Secondary Diagnosis: Principal Problem:   Schizophrenia, unspecified (HCC) Active Problems:   Amphetamine use disorder, severe (HCC)  Long Term Goal(s): Improvement in symptoms so as ready for discharge   Short Term Goals: Ability to identify changes in lifestyle to reduce recurrence of condition will improve Ability to verbalize feelings will improve Ability to disclose and discuss suicidal ideas Ability to demonstrate self-control will improve Ability to identify and develop effective coping behaviors will improve Ability to maintain clinical measurements within normal limits will improve Compliance with prescribed medications will improve Ability to identify triggers associated with substance abuse/mental health issues will improve     Medication Management: Evaluate patient's response, side effects, and tolerance of medication regimen.  Therapeutic Interventions: 1 to 1 sessions, Unit Group sessions and Medication administration.  Evaluation of Outcomes: Not Progressing   RN Treatment Plan for Primary Diagnosis: Schizophrenia, unspecified (HCC) Long Term Goal(s): Knowledge of disease and therapeutic regimen to maintain health will improve  Short Term Goals: Ability to  remain free from injury will improve, Ability to verbalize frustration and anger appropriately will improve, Ability to demonstrate self-control, Ability to participate in decision making will improve, Ability to verbalize feelings will improve, Ability to disclose and discuss suicidal ideas, Ability to identify and develop effective coping behaviors will improve, and Compliance with prescribed medications will improve  Medication Management: RN will administer medications as ordered by provider, will assess and evaluate patient's response and provide education to patient for prescribed medication. RN will report any adverse and/or side effects to prescribing provider.  Therapeutic Interventions: 1 on 1 counseling sessions, Psychoeducation, Medication administration, Evaluate responses to treatment, Monitor vital signs and CBGs as ordered, Perform/monitor CIWA, COWS, AIMS and Fall Risk screenings as ordered, Perform wound care treatments as ordered.  Evaluation of Outcomes: Not Progressing   LCSW Treatment Plan for Primary Diagnosis: Schizophrenia, unspecified (HCC) Long Term Goal(s): Safe transition to appropriate next level of care at discharge, Engage patient in therapeutic group addressing interpersonal concerns.  Short Term Goals: Engage patient in aftercare planning with referrals and resources, Increase social support, Increase ability to appropriately verbalize feelings, Increase emotional regulation, Facilitate acceptance  of mental health diagnosis and concerns, Facilitate patient progression through stages of change regarding substance use diagnoses and concerns, Identify triggers associated with mental health/substance abuse issues, and Increase skills for wellness and recovery  Therapeutic Interventions: Assess for all discharge needs, 1 to 1 time with Social worker, Explore available resources and support systems, Assess for adequacy in community support network, Educate family and  significant other(s) on suicide prevention, Complete Psychosocial Assessment, Interpersonal group therapy.  Evaluation of Outcomes: Not Progressing   Progress in Treatment: Attending groups: Yes. Participating in groups: Yes. Taking medication as prescribed: Yes Toleration medication:  Yes Family/Significant other contact made: Achillies Buehl (mother) [phone] Patient understands diagnosis: Yes. Discussing patient identified problems/goals with staff: Yes. Medical problems stabilized or resolved: Yes. Denies suicidal/homicidal ideation: Yes. Issues/concerns per patient self-inventory: No.  New problem(s) identified:  No  New Short Term/Long Term Goal(s):    medication stabilization, elimination of SI thoughts, development of comprehensive mental wellness plan.    Patient Goals:  I want to put my son first in my life.  I want to forgive others.  Patient said that his family will be evicted today and he needs housing resources.  He is also interested in medication management and therapy in the community.  Discharge Plan or Barriers:  Patient recently admitted. CSW will continue to follow and assess for appropriate referrals and possible discharge planning.    Reason for Continuation of Hospitalization: Hallucinations Medication stabilization Suicidal ideation  Estimated Length of Stay:  5 - 7 days  Last 3 [place] Suicide Severity Risk Score: Flowsheet Row [facility] (Current[facility] from [facility]20[facility] from [facility]/31/2023 in [facility]  C-SSRS RISK CATEGORY High Risk High Risk No Risk       Last [facility] 2/9 Scores:    06/24/2022    4:34 PM 10/23/2021    6:29 PM 09/01/2021   11:03 AM  Depression screen PHQ 2/9  Decreased Interest 2 2 2   Down, Depressed, Hopeless 2 2 2   PHQ - 2 Score 4 4 4   Altered sleeping 2 3 0  Tired, decreased energy 1 2 0   Change in appetite 1 2 1   Feeling bad or failure about yourself  1 2 3   Trouble concentrating 1 2 0  Moving slowly or fidgety/restless 0 2 2  Suicidal thoughts 1 0 1  PHQ-9 Score 11 17 11   Difficult doing work/chores Somewhat difficult Very difficult Somewhat difficult    Scribe for Treatment Team: Lennyx Verdell O Jammal Sarr, LCSWA 07/07/2023 1:34 PM

## 2023-07-07 NOTE — [facility] Suicide Risk Assessment (Signed)
 [facility] Admission Suicide Risk Assessment   Nursing information obtained from:    Demographic factors:  Male Current Mental Status:  Suicidal ideation indicated by patient Loss Factors:  NA Historical Factors:  Prior suicide attempts Risk Reduction Factors:  NA  Total Time spent with patient: 30 minutes Principal Problem: Schizophrenia, unspecified (HCC) Diagnosis:  Principal Problem:   Schizophrenia, unspecified (HCC) Active Problems:   Amphetamine use disorder, severe (HCC)  Subjective Data: See H&P.  Patient admitted for cutting himself due to psychosis, paranoia, delusions.  Continued Clinical Symptoms:  Alcohol Use Disorder Identification Test Final Score (AUDIT): 8 The Alcohol Use Disorders Identification Test, Guidelines for Use in Primary Care, Second Edition.  World Science Writer [facility]). Score between 0-7:  no or low risk or alcohol related problems. Score between 8-15:  moderate risk of alcohol related problems. Score between 16-19:  high risk of alcohol related problems. Score 20 or above:  warrants further diagnostic evaluation for alcohol dependence and treatment.   CLINICAL FACTORS:   Schizophrenia:   Paranoid or undifferentiated type Currently Psychotic Unstable or Poor Therapeutic Relationship Previous Psychiatric Diagnoses and Treatments    Psychiatric Specialty Exam:  Presentation  General Appearance:  Casual  Eye Contact: Fair  Speech: Normal Rate  Speech Volume: Normal  Handedness: -- (not assessed)   Mood and Affect  Mood: Anxious  Affect: Non-Congruent; Inappropriate; Full Range   Thought Process  Thought Processes: Disorganized  Descriptions of Associations:Tangential  Orientation:Full (Time, Place and Person)  Thought Content:Paranoid Ideation; Illogical  History of Schizophrenia/Schizoaffective disorder:Yes  Duration of Psychotic Symptoms:Greater than six months  Hallucinations:Hallucinations:  Auditory Description of Auditory Hallucinations: voice telling him to go out an harm himself  Ideas of Reference:Delusions  Suicidal Thoughts:Suicidal Thoughts: No SI Passive Intent and/or Plan: With Intent; With Plan  Homicidal Thoughts:Homicidal Thoughts: No   Sensorium  Memory: Immediate Fair; Recent Poor; Remote Fair  Judgment: Impaired  Insight: Lacking   Executive Functions  Concentration: Poor  Attention Span: Poor  Recall: [employer] of Knowledge: Fair  Language: Fair   Psychomotor Activity  Psychomotor Activity: Psychomotor Activity: Normal   Assets  Assets: Desire for Improvement; Resilience; Communication Skills   Sleep  Sleep: Sleep: Poor Number of Hours of Sleep: 6    Physical Exam: Physical Exam see H&P ROS see H&P Blood pressure 110/79, pulse 94, temperature 98.2 F (36.8 C), temperature source Oral, resp. rate 16, height 5' 10 (1.778 m), weight 62.1 kg, SpO2 97%. Body mass index is 19.66 kg/m.   COGNITIVE FEATURES THAT CONTRIBUTE TO RISK:  Disorganized thinking  SUICIDE RISK:   Moderate: Patient did harm self leading up to admission.  Suicidal ideation with limited intensity, and duration, some specificity in terms of plans, no associated intent, good self-control, limited dysphoria/symptomatology, some risk factors present, and identifiable protective factors, including available and accessible social support.  PLAN OF CARE: See H&P  I certify that inpatient services furnished can reasonably be expected to improve the patient's condition.   Rankin Montes, MD 07/07/2023, 9:57 AM

## 2023-07-07 NOTE — [facility] Group Notes (Signed)
 [facility] Group Notes:  (Nursing/MHT/Case Management/Adjunct)  Date:  07/07/2023  Time:  8:45 PM  Type of Therapy:   NA Group  Participation Level:  Active  Participation Quality:  Appropriate  Affect:  Appropriate  Cognitive:  Appropriate  Insight:  Appropriate  Engagement in Group:  Engaged  Modes of Intervention:  Discussion and Support  Summary of Progress/Problems: Pt participated in Narcotics Anonymous group. Jerry Garrison 07/07/2023, 8:45 PM

## 2023-07-07 NOTE — [facility] Suicide Risk Assessment (Signed)
 [facility] INPATIENT:  Family/Significant Other Suicide Prevention Education  Suicide Prevention Education:  Education Completed;mother, Jerry Garrison [phone] (name of family member/significant other) has been identified by the patient as the family member/significant other with whom the patient will be residing, and identified as the person(s) who will aid the patient in the event of a mental health crisis (suicidal ideations/suicide attempt).  With written consent from the patient, the family member/significant other has been provided the following suicide prevention education, prior to the and/or following the discharge of the patient.  CSW received permission to speak with mother who reported no safety concerns regarding pt returning home. Mother reported no firearms in the home and she will secure medications and knives. Mother reported pt has been heart broken because the mother of his child took their child and moved to [place] . Pt wants to reconcile but mother of the child does not the relationship. Mother reported pt has been hospitalized at [facility] several times.   The suicide prevention education provided includes the following: Suicide risk factors Suicide prevention and interventions National Suicide Hotline telephone number [facility] assessment telephone number [facility] Emergency Assistance 911 [facility] and/or Residential Mobile Crisis Unit telephone number  Request made of family/significant other to: Remove weapons (e.g., guns, rifles, knives), all items previously/currently identified as safety concern.   Remove drugs/medications (over-the-counter, prescriptions, illicit drugs), all items previously/currently identified as a safety concern.  The family member/significant other verbalizes understanding of the suicide prevention education information provided.  The family member/significant other agrees to remove the items of safety concern listed  above.  Jerry Garrison 07/07/2023, 12:23 PM

## 2023-07-07 NOTE — Group Note (Signed)
 Recreation Therapy Group Note   Group Topic:General Recreation  Group Date: 07/07/2023 Start Time: 0930 End Time: 1005 Facilitators: Lunabelle Oatley-McCall, LRT,CTRS Location: [address] Dayroom   Group Topic/Focus: General Recreation   Goal Area(s) Addresses:  Patient will use appropriate interactions with peers.   Patient will choose appropriate songs.  Intervention: Research scientist (medical), Television  Group Description: Karaoke. Patients took turns selecting songs that were appropriate to perform for their enjoyment with peers.      Affect/Mood: Appropriate   Participation Level: Engaged   Participation Quality: Independent   Behavior: Appropriate   Speech/Thought Process: Focused   Insight: Good   Judgement: Good   Modes of Intervention: Music   Patient Response to Interventions:  Engaged   Education Outcome:  In group clarification offered    Clinical Observations/Individualized Feedback: Pt came in late to group. Pt joined right in with the activity. Pt was bright and excited to sing a song. Pt interacted well with peers. Pt was giving compliments to peers as well as receiving compliments from peers.    Plan: Continue to engage patient in RT group sessions 2-3x/week.   Jordynne Mccown-McCall, LRT,CTRS  07/07/2023 1:03 PM

## 2023-07-07 NOTE — H&P (Signed)
 Psychiatric Admission Assessment Adult  Patient Identification: Jerry Garrison MRN:  [id number] Date of Evaluation:  07/07/2023 Chief Complaint:  Schizoaffective disorder (HCC) [F25.9] Principal Diagnosis: Schizophrenia, unspecified (HCC) Diagnosis:  Principal Problem:   Schizophrenia, unspecified (HCC) Active Problems:   Amphetamine use disorder, severe (HCC)  History of Present Illness:   Patient is a 23 year old male with a psychiatric history schizophrenia versus schizoaffective disorder and methamphetamine abuse, who was admitted to the psychiatric unit from the [facility] for evaluation and treatment of self-harm in the context of worsening psychosis.  Current outpatient psychiatric medications: 9, patient reports not taking medications for about 4 to 5 months prior to admission  Patient reports that he was brought into the hospital because I was listening to his song, and then I realized on the reason for slavery.  I am a vessel.  I cannot stand that this is happening or that I did that, and I cut myself and called 911.  Security officer found me and called 911. Patient reports having history of significant drug use in particular methamphetamine and marijuana use, but reports not using these substances for many months. UDS is negative. Patient reports being off his medications for 4 to 5 months because I did not believe that my scenario.  During the interview, patient is vague about a lot of symptoms and confused.  He gives tangential and illogical answers to a lot of the questions.  He does insinuate however that he has been feeling depressed recently but does not know for how long this has been consistently and is unable to qualify his mood at the current time.  He reports that his sleep is been up and down.  Reports that his appetite has been fluctuating as well.  His concentration is poor.  He reports last having any suicidal thoughts when he had cut himself prior to being taken to the  [facility].  Denies any HI.  Denies difficulty managing anxiety worry.  Denies having any panic attacks.  When asked if he has experienced previous trauma or abuse he responds not protecting my family.  When asked if the patient was having any auditory hallucinations he states that other people can read his mind and control his thoughts.  When asked about if the patient is having visual hallucinations he states yes it is my perspective.  He reports feeling paranoid other people are out to get him or giving him misleading information on purpose to manipulate him.  Past psychiatric history: Previous diagnosis of schizoaffective disorder versus schizophrenia.  History of narcotic overdose, requiring medical admission for aspiration pneumonia after vomiting while he was obtunded. Patient reports a history of about 4 psychiatric hospitalizations in the past, his most recent admission to this hospital was in July 2023.  He also reports that he was admitted to a psychiatric hospital in [place]  around February 2024 because he was stalking his child's mother, and the police were called and took him to the hospital.  He reports not taking medication for very long after discharge from the hospital.  Patient reports 1 suicide attempt in the past by overdose in July 2023. Past psychiatric medications: Zyprexa  and Invega .  Lexapro .  Past medical history: I am indecisive.  Denies seizure history.  Denies surgical history. Allergies: My mother is allergic to certain things like latex.  I must be allergic to that too.  Substance use history: Patient has a significant history of methamphetamine use.  Reports he stopped using meth in July.  Reports a history  of marijuana use but stopped this in August.  He reports last use of alcohol was in October.  He reports smoking cigarettes.  Family history: Patient denies any known family psychiatric history or known suicide attempts in the family.    Total Time spent with  patient: 30 minutes    Is the patient at risk to self? Yes.    Has the patient been a risk to self in the past 6 months? Yes.    Has the patient been a risk to self within the distant past? Yes.    Is the patient a risk to others? No.  Has the patient been a risk to others in the past 6 months? No.  Has the patient been a risk to others within the distant past? No.   [place]ia Scale:  Flowsheet Row [facility] (Current) [facility] from 03/31/2023 in [facility]  C-SSRS RISK CATEGORY High Risk High Risk No Risk        Alcohol Screening: 1. How often do you have a drink containing alcohol?: 2 to 4 times a month 2. How many drinks containing alcohol do you have on a typical day when you are drinking?: 5 or 6 3. How often do you have six or more drinks on one occasion?: Monthly AUDIT-C Score: 6 4. How often during the last year have you found that you were not able to stop drinking once you had started?: Never 5. How often during the last year have you failed to do what was normally expected from you because of drinking?: Less than monthly 6. How often during the last year have you needed a first drink in the morning to get yourself going after a heavy drinking session?: Never 7. How often during the last year have you had a feeling of guilt of remorse after drinking?: Less than monthly 8. How often during the last year have you been unable to remember what happened the night before because you had been drinking?: Never 9. Have you or someone else been injured as a result of your drinking?: No 10. Has a relative or friend or a doctor or another health worker been concerned about your drinking or suggested you cut down?: No Alcohol Use Disorder Identification Test Final Score (AUDIT): 8 Alcohol Brief Interventions/Follow-up: Alcohol education/Brief  advice Substance Abuse History in the last 12 months:  Yes.   Consequences of Substance Abuse: Negative Previous Psychotropic Medications: Yes  Psychological Evaluations: Yes  Past Medical History:  Past Medical History:  Diagnosis Date   Schizophrenia (HCC)    Substance abuse (HCC)     Past Surgical History:  Procedure Laterality Date   NO PAST SURGERIES     Family History: History reviewed. No pertinent family history.  Tobacco Screening:  Social History   Tobacco Use  Smoking Status Never  Smokeless Tobacco Never    BH Tobacco Counseling     Are you interested in Tobacco Cessation Medications?  N/A, patient does not use tobacco products Counseled patient on smoking cessation:  N/A, patient does not use tobacco products Reason Tobacco Screening Not Completed: No value filed.       Social History:  Social History   Substance and Sexual Activity  Alcohol Use No     Social History   Substance and Sexual Activity  Drug Use Yes   Types: Marijuana, Oxycodone    Additional  Social History:                           Allergies:   Allergies  Allergen Reactions   Latex Itching   Grass Pollen(K-O-[id number]-Swt Vern) Rash   Lab Results:  Results for orders placed or performed during the hospital encounter of 07/06/23 (from the past 48 hours)  CBC with Differential/Platelet     Status: None   Collection Time: 07/06/23  1:16 AM  Result Value Ref Range   WBC 6.6 4.0 - 10.5 K/uL   RBC 5.23 4.22 - 5.81 MIL/uL   Hemoglobin 15.0 13.0 - 17.0 g/dL   HCT 54.5 60.9 - 47.9 %   MCV 86.8 80.0 - 100.0 fL   MCH 28.7 26.0 - 34.0 pg   MCHC 33.0 30.0 - 36.0 g/dL   RDW 88.0 88.4 - 84.4 %   Platelets 236 150 - 400 K/uL   nRBC 0.0 0.0 - 0.2 %   Neutrophils Relative % 59 %   Neutro Abs 3.9 1.7 - 7.7 K/uL   Lymphocytes Relative 27 %   Lymphs Abs 1.8 0.7 - 4.0 K/uL   Monocytes Relative 14 %   Monocytes Absolute 0.9 0.1 - 1.0 K/uL   Eosinophils Relative 0 %   Eosinophils  Absolute 0.0 0.0 - 0.5 K/uL   Basophils Relative 0 %   Basophils Absolute 0.0 0.0 - 0.1 K/uL   Immature Granulocytes 0 %   Abs Immature Granulocytes 0.01 0.00 - 0.07 K/uL    Comment: Performed at [facility], [address] [place], [place] [address]  Comprehensive metabolic panel     Status: Abnormal   Collection Time: 07/06/23  1:16 AM  Result Value Ref Range   Sodium 136 135 - 145 mmol/L   Potassium 3.3 (L) 3.5 - 5.1 mmol/L   Chloride 101 98 - 111 mmol/L   CO2 25 22 - 32 mmol/L   Glucose, Bld 85 70 - 99 mg/dL    Comment: Glucose reference range applies only to samples taken after fasting for at least 8 hours.   BUN 7 6 - 20 mg/dL   Creatinine, Ser 8.96 0.61 - 1.24 mg/dL   Calcium 9.6 8.9 - 89.6 mg/dL   Total Protein 7.4 6.5 - 8.1 g/dL   Albumin 4.6 3.5 - 5.0 g/dL   AST 20 15 - 41 U/L   ALT 18 0 - 44 U/L   Alkaline Phosphatase 53 38 - 126 U/L   Total Bilirubin 2.9 (H) 0.0 - 1.2 mg/dL   GFR, Estimated >39 >39 mL/min    Comment: (NOTE) Calculated using the CKD-EPI Creatinine Equation (2021)    Anion gap 10 5 - 15    Comment: Performed at [facility], [address], [place], [place] [address]  Ethanol     Status: None   Collection Time: 07/06/23  1:16 AM  Result Value Ref Range   Alcohol, Ethyl (B) <10 <10 mg/dL    Comment: (NOTE) Lowest detectable limit for serum alcohol is 10 mg/dL.  For medical purposes only. Performed at [facility], [address], [place], [place] [address]   TSH     Status: None   Collection Time: 07/06/23  1:16 AM  Result Value Ref Range   TSH 3.214 0.350 - 4.500 uIU/mL    Comment: Performed by a 3rd Generation assay with a functional sensitivity of <=0.01 uIU/mL. Performed at [facility],  [address] [place], [place] [address]   POCT Urine Drug Screen - (I-Screen)     Status: Normal   Collection Time: 07/06/23  1:17 AM  Result Value Ref Range   POC Amphetamine UR None Detected NONE DETECTED (Cut Off Level  1000 ng/mL)   POC Secobarbital (BAR) None Detected NONE DETECTED (Cut Off Level 300 ng/mL)   POC Buprenorphine (BUP) None Detected NONE DETECTED (Cut Off Level 10 ng/mL)   POC Oxazepam (BZO) None Detected NONE DETECTED (Cut Off Level 300 ng/mL)   POC Cocaine UR None Detected NONE DETECTED (Cut Off Level 300 ng/mL)   POC Methamphetamine UR None Detected NONE DETECTED (Cut Off Level 1000 ng/mL)   POC Morphine None Detected NONE DETECTED (Cut Off Level 300 ng/mL)   POC Methadone UR None Detected NONE DETECTED (Cut Off Level 300 ng/mL)   POC Oxycodone UR None Detected NONE DETECTED (Cut Off Level 100 ng/mL)   POC Marijuana UR None Detected NONE DETECTED (Cut Off Level 50 ng/mL)    Blood Alcohol level:  Lab Results  Component Value Date   ETH <10 07/06/2023   ETH <10 01/24/2022    Metabolic Disorder Labs:  Lab Results  Component Value Date   HGBA1C 4.4 (L) 08/07/2021   MPG 79.58 08/07/2021   No results found for: PROLACTIN Lab Results  Component Value Date   CHOL 135 08/07/2021   TRIG 26 08/07/2021   HDL 52 08/07/2021   CHOLHDL 2.6 08/07/2021   VLDL 5 08/07/2021   LDLCALC 78 08/07/2021    Current Medications: Current Facility-Administered Medications  Medication Dose Route Frequency Provider Last Rate Last Admin   acetaminophen  (TYLENOL ) tablet 650 mg  650 mg Oral Q6H PRN Carrion-Carrero, Margely, MD       alum & mag hydroxide-simeth (MAALOX/MYLANTA) 200-200-20 MG/5ML suspension 30 mL  30 mL Oral Q4H PRN Carrion-Carrero, Margely, MD       diphenhydrAMINE  (BENADRYL ) capsule 25 mg  25 mg Oral Q6H PRN Carrion-Carrero, Margely, MD       haloperidol  (HALDOL ) tablet 5 mg  5 mg Oral TID PRN Carrion-Carrero, Margely, MD       And   diphenhydrAMINE  (BENADRYL ) capsule 50 mg  50 mg Oral TID PRN Carrion-Carrero, Margely, MD       haloperidol  lactate (HALDOL ) injection 5 mg  5 mg Intramuscular TID PRN Carrion-Carrero, Margely, MD       And   diphenhydrAMINE  (BENADRYL ) injection 50 mg   50 mg Intramuscular TID PRN Carrion-Carrero, Margely, MD       And   LORazepam  (ATIVAN ) injection 2 mg  2 mg Intramuscular TID PRN Carrion-Carrero, Margely, MD       haloperidol  lactate (HALDOL ) injection 10 mg  10 mg Intramuscular TID PRN Carrion-Carrero, Margely, MD       And   diphenhydrAMINE  (BENADRYL ) injection 50 mg  50 mg Intramuscular TID PRN Carrion-Carrero, Margely, MD       And   LORazepam  (ATIVAN ) injection 2 mg  2 mg Intramuscular TID PRN Carrion-Carrero, Margely, MD       hydrOXYzine  (ATARAX ) tablet 25 mg  25 mg Oral TID PRN Carrion-Carrero, Margely, MD       magnesium  hydroxide (MILK OF MAGNESIA) suspension 30 mL  30 mL Oral Daily PRN Carrion-Carrero, Margely, MD       neomycin -bacitracin -polymyxin (NEOSPORIN) ointment   Topical BID Merinda Victorino, MD       paliperidone  (INVEGA ) 24 hr tablet 3 mg  3 mg Oral QHS Nashali Ditmer, Rankin,  MD       [START ON 07/08/2023] paliperidone  (INVEGA ) 24 hr tablet 6 mg  6 mg Oral QHS Caetano Oberhaus, MD       traZODone  (DESYREL ) tablet 50 mg  50 mg Oral QHS PRN Carrion-Carrero, Margely, MD       PTA Medications: Medications Prior to Admission  Medication Sig Dispense Refill Last Dose/Taking   escitalopram  (LEXAPRO ) 5 MG tablet Take 1 tablet (5 mg total) by mouth daily. (Patient not taking: Reported on 07/06/2023) 30 tablet 0    hydrOXYzine  (ATARAX ) 25 MG tablet Take 1 tablet (25 mg total) by mouth 3 (three) times daily as needed for anxiety. (Patient not taking: Reported on 07/06/2023) 30 tablet 0    nicotine  (NICODERM CQ  - DOSED IN MG/24 HOURS) 14 mg/24hr patch Place 1 patch (14 mg total) onto the skin daily. (May buy from over the counter): For smoking cessation. (Patient not taking: Reported on 07/06/2023) 1 patch 0    nicotine  (NICODERM CQ  - DOSED IN MG/24 HOURS) 14 mg/24hr patch Place 1 patch (14 mg total) onto the skin daily as needed (NRT). 28 patch 0    paliperidone  (INVEGA ) 3 MG 24 hr tablet Take 1 tablet (3 mg total) by mouth daily.       traZODone  (DESYREL ) 100 MG tablet Take 1 tablet (100 mg total) by mouth at bedtime. (Patient not taking: Reported on 07/06/2023) 30 tablet 0     Musculoskeletal: Strength & Muscle Tone: within normal limits Gait & Station: normal Patient leans: N/A            Psychiatric Specialty Exam:  Presentation  General Appearance:  Casual  Eye Contact: Fair  Speech: Normal Rate  Speech Volume: Normal  Handedness: -- (not assessed)   Mood and Affect  Mood: Anxious  Affect: Non-Congruent; Inappropriate; Full Range   Thought Process  Thought Processes: Disorganized  Duration of Psychotic Symptoms: Patient unclear Past Diagnosis of Schizophrenia or Psychoactive disorder: Yes  Descriptions of Associations:Tangential  Orientation:Full (Time, Place and Person)  Thought Content:Paranoid Ideation; Illogical  Hallucinations:Hallucinations: Auditory Description of Auditory Hallucinations: voice telling him to go out an harm himself  Ideas of Reference:Delusions  Suicidal Thoughts:Suicidal Thoughts: No SI Passive Intent and/or Plan: With Intent; With Plan  Homicidal Thoughts:Homicidal Thoughts: No   Sensorium  Memory: Immediate Fair; Recent Poor; Remote Fair  Judgment: Impaired  Insight: Lacking   Executive Functions  Concentration: Poor  Attention Span: Poor  Recall: [employer] of Knowledge: Fair  Language: Fair   Psychomotor Activity  Psychomotor Activity: Psychomotor Activity: Normal   Assets  Assets: Desire for Improvement; Resilience; Communication Skills   Sleep  Sleep: Sleep: Poor Number of Hours of Sleep: 6    Physical Exam: Physical Exam Vitals reviewed.  Constitutional:      General: He is not in acute distress.    Appearance: He is normal weight. He is not toxic-appearing.  Pulmonary:     Effort: Pulmonary effort is normal. No respiratory distress.  Neurological:     Mental Status: He is alert.      Motor: No weakness.     Gait: Gait normal.    Review of Systems  Constitutional:  Negative for chills and fever.  Cardiovascular:  Negative for chest pain and palpitations.  Neurological:  Negative for dizziness, tingling, tremors and headaches.  Psychiatric/Behavioral:  Positive for depression, hallucinations, substance abuse and suicidal ideas. Negative for memory loss. The patient is nervous/anxious and has insomnia.   All other systems  reviewed and are negative.  Blood pressure 110/79, pulse 94, temperature 98.2 F (36.8 C), temperature source Oral, resp. rate 16, height 5' 10 (1.778 m), weight 62.1 kg, SpO2 97%. Body mass index is 19.66 kg/m.  Treatment Plan Summary: Daily contact with patient to assess and evaluate symptoms and progress in treatment and Medication management  ASSESSMENT:  Diagnoses / Active Problems: Schizophrenia, type unspecified versus schizoaffective disorder (by history)   History of methamphetamine use disorder  History of cannabis use disorder Tobacco use   PLAN: Safety and Monitoring:  --  Voluntary admission to inpatient psychiatric unit for safety, stabilization and treatment  -- Daily contact with patient to assess and evaluate symptoms and progress in treatment  -- Patient's case to be discussed in multi-disciplinary team meeting  -- Observation Level : q15 minute checks  -- Vital signs:  q12 hours  -- Precautions: suicide, elopement, and assault  2. Psychiatric Diagnoses and Treatment:    -Increase Invega  from 3 mg once daily to 6 mg nightly (will give 3 mg dose tonight in addition to the 3 mg dose he received this morning) for schizophrenia.  Goal for LAI.    --  The risks/benefits/side-effects/alternatives to this medication were discussed in detail with the patient and time was given for questions. The patient consents to medication trial.    -- Metabolic profile and EKG monitoring obtained while on an atypical antipsychotic (BMI:  Lipid Panel: HbgA1c: QTc:)   -- Encouraged patient to participate in unit milieu and in scheduled group therapies   -- Short Term Goals: Ability to identify changes in lifestyle to reduce recurrence of condition will improve, Ability to verbalize feelings will improve, Ability to disclose and discuss suicidal ideas, Ability to demonstrate self-control will improve, Ability to identify and develop effective coping behaviors will improve, Ability to maintain clinical measurements within normal limits will improve, Compliance with prescribed medications will improve, and Ability to identify triggers associated with substance abuse/mental health issues will improve  -- Long Term Goals: Improvement in symptoms so as ready for discharge    3. Medical Issues Being Addressed:   Tobacco Use Disorder  -- Nicotine  patch 21mg /24 hours ordered  -- Smoking cessation encouraged  4. Discharge Planning:   -- Social work and case management to assist with discharge planning and identification of hospital follow-up needs prior to discharge  -- Estimated LOS: 5-7 days  -- Discharge Concerns: Need to establish a safety plan; Medication compliance and effectiveness  -- Discharge Goals: Return home with outpatient referrals for mental health follow-up including medication management/psychotherapy    I certify that inpatient services furnished can reasonably be expected to improve the patient's condition.    Rankin Montes, MD 1/1/202510:00 AM  Total Time Spent in Direct Patient Care:  I personally spent 60 minutes on the unit in direct patient care. The direct patient care time included face-to-face time with the patient, reviewing the patient's chart, communicating with other professionals, and coordinating care. Greater than 50% of this time was spent in counseling or coordinating care with the patient regarding goals of hospitalization, psycho-education, and discharge planning needs.   Rankin Montes,  MD Psychiatrist

## 2023-07-07 NOTE — Plan of Care (Signed)

## 2023-07-08 ENCOUNTER — Telehealth (HOSPITAL_COMMUNITY): Payer: Self-pay | Admitting: Pharmacy Technician

## 2023-07-08 ENCOUNTER — Other Ambulatory Visit (HOSPITAL_COMMUNITY): Payer: Self-pay

## 2023-07-08 DIAGNOSIS — F203 Undifferentiated schizophrenia: Secondary | ICD-10-CM | POA: Diagnosis not present

## 2023-07-08 MED ORDER — PROPRANOLOL HCL 10 MG PO TABS
10.0000 mg | ORAL_TABLET | Freq: Two times a day (BID) | ORAL | Status: DC
  Administered 2023-07-08 – 2023-07-10 (×4): 10 mg via ORAL
  Filled 2023-07-08 (×8): qty 1

## 2023-07-08 MED ORDER — HALOPERIDOL 5 MG PO TABS
5.0000 mg | ORAL_TABLET | Freq: Every day | ORAL | Status: DC
  Administered 2023-07-08 – 2023-07-11 (×4): 5 mg via ORAL
  Filled 2023-07-08 (×6): qty 1

## 2023-07-08 MED ORDER — PALIPERIDONE ER 3 MG PO TB24
9.0000 mg | ORAL_TABLET | Freq: Every day | ORAL | Status: DC
  Filled 2023-07-08 (×2): qty 3

## 2023-07-08 NOTE — Progress Notes (Addendum)
 D. Pt presents animated, pleasant, smiling, he stated, I feel like I've been reborn. Asking pt to elaborate, pt reported that he believed that people were helping him, and that he will  obey. Pt currently denies SI/HI and AVH and does not appear to be responding to internal stimuli. Pt has been visible in the milieu, observed attending groups. A. Labs and vitals monitored.Pt supported emotionally and encouraged to express concerns and ask questions.   R. Pt remains safe with 15 minute checks. Will continue POC.

## 2023-07-08 NOTE — Group Note (Signed)
 Date:  07/09/2023 Time:  4:22 AM  Group Topic/Focus:  Wrap-Up Group:   The focus of this group is to help patients review their daily goal of treatment and discuss progress on daily workbooks.    Participation Level:  Active  Participation Quality:  Intrusive, Redirectable, and Sharing  Affect:  Appropriate  Cognitive:  Appropriate  Insight: Appropriate and Lacking  Engagement in Group:  Distracting, Engaged, Off Topic, and Inappropriate  Modes of Intervention:  Activity and Support  Additional Comments:  Patients used and completed wrap-up groups sheet and shared one things from paper with the group. The patient rated his day a 8 out of 10. Patient goal for today was to be my best and patient wrote that he  Mostly achieved that goal. Patient shared a coping skills that he finds most helpful is talking with family (socializing). One thing the patient likes about himself is his my beauty. Patient participated in activity after sharing. Patient was making inappropriate comments during activity and being somewhat disruptive.   Eward Mace 07/09/2023, 4:22 AM

## 2023-07-08 NOTE — Plan of Care (Signed)
  Problem: Activity: Goal: Interest or engagement in activities will improve Outcome: Progressing   Problem: Education: Goal: Emotional status will improve Outcome: Progressing Goal: Mental status will improve Outcome: Progressing

## 2023-07-08 NOTE — Telephone Encounter (Addendum)
 Patient Product/process Development Scientist completed.    The patient is insured through [facility]. Patient has [employer], may use a copay card, and/or apply for patient assistance if available.    Ran test claim for Uzedy 100 and the current 30 day co-pay is $1,325.00.  Ran test claim for Invega  156 mg and the current 30 day co-pay is $1,135.98.  Ran test claim for haloperidol  decanoate 100 mg/ml and the current 30 day co-pay is $8.37.  This test claim was processed through [facility]- copay amounts may vary at other pharmacies due to pharmacy/plan contracts, or as the patient moves through the different stages of their insurance plan.     Reyes Sharps, CPHT Pharmacy Technician III Certified Patient Advocate [facility] Patient Advocate Team Direct Number: [phone]  Fax: [phone]

## 2023-07-08 NOTE — Progress Notes (Addendum)
 [facility] MD Progress Note  07/08/2023 10:25 AM Jerry Garrison  MRN:  [id number]  Subjective:    Patient is a 23 year old male with a psychiatric history schizophrenia versus schizoaffective disorder and methamphetamine abuse, who was admitted to the psychiatric unit from the [facility] for evaluation and treatment of self-harm in the context of worsening psychosis.   Yesterday the psychiatry team made the following recommendations: -Increase Invega  from 3 mg once daily to 6 mg nightly (will give 3 mg dose tonight in addition to the 3 mg dose he received this morning) for schizophrenia.  Goal for LAI.    On assessment today, the patient reports I am having issues with my mind.  I am not being rational.  I know this because my wife does not want to be.  I do not know.  I guess I am always improving.SABRA  He continues to have some scattered and tangential thinking.  He denies paranoia but reports auditory hallucinations continue but cannot describe the content of this.  He reports that others can control his thoughts, and has symptoms of thought insertion as well.  He reports tolerating increasing the Invega  without any side effects.  Reports that sleep is a little better.  His concentration is some better compared to yesterday, when he was very confused.  He is still agreeable with LAI, and will check with pharmacy to make sure his insurance will cover it.  He had been on Invega  Sustenna in the past.  He otherwise denies having any SI or HI.  Per Pharmacy, invega  sustenna is over 1k per month in cost with copay. I discussed this with the pt, switching to haldol , which the decanoate is about $8 per month, and pt is agreeable. Will see if pt responds to oral trial of haldol , then administer decanoate. Pt is agreeable.    Principal Problem: Schizophrenia, unspecified (HCC) Diagnosis: Principal Problem:   Schizophrenia, unspecified (HCC) Active Problems:   Amphetamine use disorder, severe (HCC)  Total Time spent  with patient: 20 minutes  Past Psychiatric History:  Previous diagnosis of schizoaffective disorder versus schizophrenia.  History of narcotic overdose, requiring medical admission for aspiration pneumonia after vomiting while he was obtunded. Patient reports a history of about 4 psychiatric hospitalizations in the past, his most recent admission to this hospital was in July 2023.  He also reports that he was admitted to a psychiatric hospital in [place]  around February 2024 because he was stalking his child's mother, and the police were called and took him to the hospital.  He reports not taking medication for very long after discharge from the hospital.  Patient reports 1 suicide attempt in the past by overdose in July 2023. Past psychiatric medications: Zyprexa  and Invega .  Lexapro .   Past Medical History:  Past Medical History:  Diagnosis Date   Schizophrenia (HCC)    Substance abuse (HCC)     Past Surgical History:  Procedure Laterality Date   NO PAST SURGERIES     Family History: History reviewed. No pertinent family history. Family Psychiatric  History: See H&P Social History:  Social History   Substance and Sexual Activity  Alcohol Use No     Social History   Substance and Sexual Activity  Drug Use Yes   Types: Marijuana, Oxycodone    Social History   Socioeconomic History   Marital status: Single    Spouse name: Not on file   Number of children: Not on file   Years of education: Not on file  Highest education level: Not on file  Occupational History   Not on file  Tobacco Use   Smoking status: Never   Smokeless tobacco: Never  Vaping Use   Vaping status: Never Used  Substance and Sexual Activity   Alcohol use: No   Drug use: Yes    Types: Marijuana, Oxycodone   Sexual activity: Not Currently  Other Topics Concern   Not on file  Social History Narrative   Not on file   Social Drivers of Health   Financial Resource Strain: Not on file  Food  Insecurity: No Food Insecurity (07/06/2023)   Hunger Vital Sign    Worried About Running Out of Food in the Last Year: Never true    Ran Out of Food in the Last Year: Never true  Transportation Needs: No Transportation Needs (07/06/2023)   PRAPARE - Administrator, Civil Service (Medical): No    Lack of Transportation (Non-Medical): No  Physical Activity: Not on file  Stress: Not on file  Social Connections: Not on file   Additional Social History:                          Current Medications: Current Facility-Administered Medications  Medication Dose Route Frequency Provider Last Rate Last Admin   acetaminophen  (TYLENOL ) tablet 650 mg  650 mg Oral Q6H PRN Carrion-Carrero, Margely, MD       alum & mag hydroxide-simeth (MAALOX/MYLANTA) 200-200-20 MG/5ML suspension 30 mL  30 mL Oral Q4H PRN Carrion-Carrero, Margely, MD       diphenhydrAMINE  (BENADRYL ) capsule 25 mg  25 mg Oral Q6H PRN Carrion-Carrero, Margely, MD       haloperidol  (HALDOL ) tablet 5 mg  5 mg Oral TID PRN Carrion-Carrero, Margely, MD       And   diphenhydrAMINE  (BENADRYL ) capsule 50 mg  50 mg Oral TID PRN Carrion-Carrero, Margely, MD       haloperidol  lactate (HALDOL ) injection 5 mg  5 mg Intramuscular TID PRN Carrion-Carrero, Margely, MD       And   diphenhydrAMINE  (BENADRYL ) injection 50 mg  50 mg Intramuscular TID PRN Carrion-Carrero, Margely, MD       And   LORazepam  (ATIVAN ) injection 2 mg  2 mg Intramuscular TID PRN Carrion-Carrero, Margely, MD       haloperidol  lactate (HALDOL ) injection 10 mg  10 mg Intramuscular TID PRN Carrion-Carrero, Margely, MD       And   diphenhydrAMINE  (BENADRYL ) injection 50 mg  50 mg Intramuscular TID PRN Carrion-Carrero, Margely, MD       And   LORazepam  (ATIVAN ) injection 2 mg  2 mg Intramuscular TID PRN Carrion-Carrero, Marlo, MD       hydrOXYzine  (ATARAX ) tablet 25 mg  25 mg Oral TID PRN Homer Marlo, MD   25 mg at 07/07/23 2117   magnesium   hydroxide (MILK OF MAGNESIA) suspension 30 mL  30 mL Oral Daily PRN Carrion-Carrero, Marlo, MD       neomycin -bacitracin -polymyxin (NEOSPORIN) ointment   Topical BID Tra Wilemon, MD       nicotine  (NICODERM CQ  - dosed in mg/24 hours) patch 14 mg  14 mg Transdermal Daily Kelle Ruppert, MD   14 mg at 07/08/23 9094   nicotine  polacrilex (NICORETTE ) gum 2 mg  2 mg Oral PRN Zonya Gudger, Rankin, MD       paliperidone  (INVEGA ) 24 hr tablet 9 mg  9 mg Oral QHS Johny Rankin, MD  traZODone  (DESYREL ) tablet 50 mg  50 mg Oral QHS PRN Homer Shams, MD   50 mg at 07/07/23 2117    Lab Results:  Results for orders placed or performed during the hospital encounter of 07/07/23 (from the past 48 hours)  Lipid panel     Status: None   Collection Time: 07/07/23  6:15 PM  Result Value Ref Range   Cholesterol 117 0 - 200 mg/dL   Triglycerides 22 <849 mg/dL   HDL 56 >59 mg/dL   Total CHOL/HDL Ratio 2.1 RATIO   VLDL 4 0 - 40 mg/dL   LDL Cholesterol 57 0 - 99 mg/dL    Comment:        Total Cholesterol/HDL:CHD Risk Coronary Heart Disease Risk Table                     Men   Women  1/2 Average Risk   3.4   3.3  Average Risk       5.0   4.4  2 X Average Risk   9.6   7.1  3 X Average Risk  23.4   11.0        Use the calculated Patient Ratio above and the CHD Risk Table to determine the patient's CHD Risk.        ATP III CLASSIFICATION (LDL):  <100     mg/dL   Optimal  899-870  mg/dL   Near or Above                    Optimal  130-159  mg/dL   Borderline  839-810  mg/dL   High  >809     mg/dL   Very High Performed at [facility], [address], [place], [place] [address]   TSH     Status: None   Collection Time: 07/07/23  6:15 PM  Result Value Ref Range   TSH 2.114 0.350 - 4.500 uIU/mL    Comment: Performed by a 3rd Generation assay with a functional sensitivity of <=0.01 uIU/mL. Performed at [facility], [address] [place], [place] [address]     Blood Alcohol level:  Lab Results  Component Value Date   [facility] <10 07/06/2023   ETH <10 01/24/2022    Metabolic Disorder Labs: Lab Results  Component Value Date   HGBA1C 4.4 (L) 08/07/2021   MPG 79.58 08/07/2021   No results found for: PROLACTIN Lab Results  Component Value Date   CHOL 117 07/07/2023   TRIG 22 07/07/2023   HDL 56 07/07/2023   CHOLHDL 2.1 07/07/2023   VLDL 4 07/07/2023   LDLCALC 57 07/07/2023   LDLCALC 78 08/07/2021    Physical Findings: AIMS:  , ,  ,  ,    CIWA:    COWS:     Musculoskeletal: Strength & Muscle Tone: within normal limits Gait & Station: normal Patient leans: N/A  Psychiatric Specialty Exam:  Presentation  General Appearance:  Casual  Eye Contact: Fair  Speech: Normal Rate  Speech Volume: Normal  Handedness: -- (not assessed)   Mood and Affect  Mood: Anxious  Affect: Full Range   Thought Process  Thought Processes: Linear  Descriptions of Associations:Intact  Orientation:Full (Time, Place and Person)  Thought Content:Illogical; Scattered  History of Schizophrenia/Schizoaffective disorder:Yes  Duration of Psychotic Symptoms:Greater than six months  Hallucinations:Hallucinations: Auditory  Ideas of Reference:-- (+ thought insertion, thought control)  Suicidal Thoughts:Suicidal Thoughts: No  Homicidal Thoughts:Homicidal  Thoughts: No   Sensorium  Memory: Immediate Fair; Recent Fair; Remote Fair  Judgment: Fair  Insight: Fair   Chartered Certified Accountant: Fair  Attention Span: Poor  Recall: [employer] of Knowledge: Fair  Language: Fair   Psychomotor Activity  Psychomotor Activity: Psychomotor Activity: Normal   Assets  Assets: Desire for Improvement; Resilience; Communication Skills   Sleep  Sleep: Sleep: Fair    Physical Exam: Physical Exam Vitals reviewed.  Constitutional:      General: He is not in acute  distress.    Appearance: He is normal weight. He is not toxic-appearing.  Pulmonary:     Effort: Pulmonary effort is normal. No respiratory distress.  Neurological:     Mental Status: He is alert.     Motor: No weakness.     Gait: Gait normal.  Psychiatric:        Behavior: Behavior normal.    Review of Systems  Constitutional:  Negative for chills and fever.  Cardiovascular:  Negative for chest pain and palpitations.  Neurological:  Negative for dizziness, tingling, tremors and headaches.  Psychiatric/Behavioral:  Positive for hallucinations. Negative for depression, memory loss, substance abuse and suicidal ideas. The patient is nervous/anxious. The patient does not have insomnia.   All other systems reviewed and are negative.  Blood pressure 126/70, pulse 99, temperature 98.1 F (36.7 C), temperature source Oral, resp. rate 16, height 5' 10 (1.778 m), weight 62.1 kg, SpO2 97%. Body mass index is 19.66 kg/m.   Treatment Plan Summary: Daily contact with patient to assess and evaluate symptoms and progress in treatment and Medication management  ASSESSMENT:   Diagnoses / Active Problems: Schizophrenia, type unspecified versus schizoaffective disorder (by history)    History of methamphetamine use disorder  History of cannabis use disorder Tobacco use     PLAN: Safety and Monitoring:             --  Voluntary admission to inpatient psychiatric unit for safety, stabilization and treatment             -- Daily contact with patient to assess and evaluate symptoms and progress in treatment             -- Patient's case to be discussed in multi-disciplinary team meeting             -- Observation Level : q15 minute checks             -- Vital signs:  q12 hours             -- Precautions: suicide, elopement, and assault   2. Psychiatric Diagnoses and Treatment:               -D/c Invega  due to high cost of LAI (over $1k per month). -Start haldol  5 mg at bedtime for  psychosis. Plan for Awanda which is $8 per month per pharmacy.     --  The risks/benefits/side-effects/alternatives to this medication were discussed in detail with the patient and time was given for questions. The patient consents to medication trial.                -- Metabolic profile and EKG monitoring obtained while on an atypical antipsychotic (BMI: Lipid Panel: HbgA1c: QTc:)              -- Encouraged patient to participate in unit milieu and in scheduled group therapies  3. Medical Issues Being Addressed:              Tobacco Use Disorder             -- Nicotine  patch 21mg /24 hours ordered             -- Smoking cessation encouraged   4. Discharge Planning:              -- Social work and case management to assist with discharge planning and identification of hospital follow-up needs prior to discharge             -- Estimated LOS: 4-5 more days             -- Discharge Concerns: Need to establish a safety plan; Medication compliance and effectiveness             -- Discharge Goals: Return home with outpatient referrals for mental health follow-up including medication management/psychotherapy      Rankin Montes, MD 07/08/2023, 10:25 AM  Total Time Spent in Direct Patient Care:  I personally spent 35 minutes on the unit in direct patient care. The direct patient care time included face-to-face time with the patient, reviewing the patient's chart, communicating with other professionals, and coordinating care. Greater than 50% of this time was spent in counseling or coordinating care with the patient regarding goals of hospitalization, psycho-education, and discharge planning needs.   Rankin Montes, MD Psychiatrist

## 2023-07-08 NOTE — Progress Notes (Signed)
   07/08/23 1100  Psych Admission Type (Psych Patients Only)  Admission Status Voluntary  Psychosocial Assessment  Patient Complaints None  Eye Contact Fair  Facial Expression Animated  Affect Appropriate to circumstance  Speech Logical/coherent  Interaction Assertive  Motor Activity Fidgety  Appearance/Hygiene Unremarkable  Behavior Characteristics Cooperative  Mood Pleasant  Thought Process  Coherency WDL  Content WDL  Delusions None reported or observed  Perception WDL  Hallucination None reported or observed  Judgment Poor  Confusion None  Danger to Self  Current suicidal ideation? Denies  Danger to Others  Danger to Others None reported or observed

## 2023-07-08 NOTE — Plan of Care (Signed)

## 2023-07-08 NOTE — Progress Notes (Signed)
   07/07/23 2200  Psych Admission Type (Psych Patients Only)  Admission Status Voluntary  Psychosocial Assessment  Patient Complaints Depression  Eye Contact Fair  Facial Expression Animated  Affect Appropriate to circumstance  Speech Logical/coherent  Interaction Assertive  Motor Activity Restless  Appearance/Hygiene Unremarkable  Behavior Characteristics Cooperative  Mood Pleasant  Thought Process  Coherency WDL  Content WDL  Delusions None reported or observed  Perception WDL  Hallucination None reported or observed  Judgment Poor  Confusion None  Danger to Self  Current suicidal ideation? Denies  Agreement Not to Harm Self Yes  Description of Agreement Verbal  Danger to Others  Danger to Others None reported or observed

## 2023-07-08 NOTE — [facility] Group Notes (Signed)
 [facility] Group Notes:  (Nursing/MHT/Case Management/Adjunct)  Date:  07/08/2023  Time:  9:22 AM  Type of Therapy:  Group Topic/ Focus: Goals Group: The focus of this group is to help patients establish daily goals to achieve during treatment and discuss how the patient can incorporate goal setting into their daily lives to aide in recovery.   Participation Level:  Active  Participation Quality:  Appropriate  Affect:  Appropriate  Cognitive:  Appropriate  Insight:  Appropriate  Engagement in Group:  Engaged  Modes of Intervention:  Discussion  Summary of Progress/Problems:  Patient attended and participated in goals/ orientation group today. Patient's goal for today is to stay active and attend all groups.   Danette R Shenouda Genova 07/08/2023, 9:22 AM

## 2023-07-08 NOTE — Group Note (Signed)
 LCSW Group Therapy Note   Group Date: 07/08/2023 Start Time: 1100 End Time: 1200  Type of Therapy and Topic:  Group Therapy - Who Am I?  Participation Level:  DID NOT ATTEND  Description of Group The focus of this group was to aid patients in self-exploration and awareness. Patients were guided in exploring various factors of oneself to include interests, readiness to change, management of emotions, and individual perception of self. Patients were provided with complementary worksheets exploring hidden talents, ease of asking other for help, music/media preferences, understanding and responding to feelings/emotions, and hope for the future. At group closing, patients were encouraged to adhere to discharge plan to assist in continued self-exploration and understanding.  Therapeutic Goals Patients learned that self-exploration and awareness is an ongoing process Patients identified their individual skills, preferences, and abilities Patients explored their openness to establish and confide in supports Patients explored their readiness for change and progression of mental health    Therapeutic Modalities Cognitive Behavioral Therapy  Landon Bassford R, LCSWA 07/09/2023  7:30 PM

## 2023-07-09 DIAGNOSIS — F203 Undifferentiated schizophrenia: Secondary | ICD-10-CM

## 2023-07-09 LAB — HEMOGLOBIN A1C
Hgb A1c MFr Bld: 5.1 % (ref 4.8–5.6)
Mean Plasma Glucose: 100 mg/dL

## 2023-07-09 NOTE — Group Note (Signed)
 Date:  07/09/2023 Time:  9:39 PM  Group Topic/Focus:  Wrap-Up Group:   The focus of this group is to help patients review their daily goal of treatment and discuss progress on daily workbooks.    Participation Level:  Active  Participation Quality:  Appropriate  Affect:  Appropriate  Cognitive:  Appropriate  Insight: Improving  Engagement in Group:  Engaged  Modes of Intervention:  Discussion  Additional Comments:  Client attended group which was led by members of Alcoholics Anonymous.   Jerry Garrison 07/09/2023, 9:39 PM

## 2023-07-09 NOTE — Progress Notes (Signed)
 [facility] MD Progress Note  07/09/2023 2:50 PM Jerry Garrison  MRN:  [id number]  Subjective:    Patient is a 23 year old male with a psychiatric history schizophrenia versus schizoaffective disorder and methamphetamine abuse, who was admitted to the psychiatric unit from the [facility] for evaluation and treatment of self-harm in the context of worsening psychosis.   -Discontinued Invega  6 mg nightly due to cost. Started on Haldol  5 mg Q hs for schizophrenia. Goal for LAI by discharge.    Daily notes: Jerry Garrison is seen today, chart reviewed. The chart findings discussed with the treatment. He presents alert, oriented & aware of situation. He is making a good eye contact & verbally responsive. His affect is good & reactive. However, patient appears disheveled. During this evaluation, he reports, ''I came to the hospital on the 31st of December which was my birthday. I was not happy with my life due to societal problems. I listened to some music that made me think I was the reason for the slavery of mankind. I had a lot of guilt & struggled with myself. But I'm feeling a lot better since being here. I'm doing well on my medicines, no side effects. I'm attending group sessions. I slept well last night & my appetite is good. Jerry Garrison currently denies any SIHI, AVH, delusional thoughts or paranoia. He does not appear to be responding to any internal stimuli. There have been some changes made on the patient medications. Patient is planning to get a job at his [employer] here in [place] after discharge. See the notes below.  Per previous notes: Per Pharmacy, invega  sustenna is over 1k per month in cost with copay. I discussed this with the pt, switching to haldol , which the decanoate is about $8 per month, and pt is agreeable. Will see if pt responds to oral trial of haldol , then administer decanoate. Pt is agreeable.   Principal Problem: Schizophrenia, unspecified (HCC) Diagnosis: Principal Problem:    Schizophrenia, unspecified (HCC) Active Problems:   Amphetamine use disorder, severe (HCC)  Total Time spent with patient: 20 minutes  Past Psychiatric History:  Previous diagnosis of schizoaffective disorder versus schizophrenia.  History of narcotic overdose, requiring medical admission for aspiration pneumonia after vomiting while he was obtunded. Patient reports a history of about 4 psychiatric hospitalizations in the past, his most recent admission to this hospital was in July 2023.  He also reports that he was admitted to a psychiatric hospital in [place]  around February 2024 because he was stalking his child's mother, and the police were called and took him to the hospital.  He reports not taking medication for very long after discharge from the hospital.  Patient reports 1 suicide attempt in the past by overdose in July 2023. Past psychiatric medications: Zyprexa  and Invega .  Lexapro .   Past Medical History:  Past Medical History:  Diagnosis Date   Schizophrenia (HCC)    Substance abuse (HCC)     Past Surgical History:  Procedure Laterality Date   NO PAST SURGERIES     Family History: History reviewed. No pertinent family history.  Family Psychiatric  History: See H&P  Social History:  Social History   Substance and Sexual Activity  Alcohol Use No     Social History   Substance and Sexual Activity  Drug Use Yes   Types: Marijuana, Oxycodone    Social History   Socioeconomic History   Marital status: Single    Spouse name: Not on file   Number of children: Not  on file   Years of education: Not on file   Highest education level: Not on file  Occupational History   Not on file  Tobacco Use   Smoking status: Never   Smokeless tobacco: Never  Vaping Use   Vaping status: Never Used  Substance and Sexual Activity   Alcohol use: No   Drug use: Yes    Types: Marijuana, Oxycodone   Sexual activity: Not Currently  Other Topics Concern   Not on file  Social  History Narrative   Not on file   Social Drivers of Health   Financial Resource Strain: Not on file  Food Insecurity: No Food Insecurity (07/06/2023)   Hunger Vital Sign    Worried About Running Out of Food in the Last Year: Never true    Ran Out of Food in the Last Year: Never true  Transportation Needs: No Transportation Needs (07/06/2023)   PRAPARE - Administrator, Civil Service (Medical): No    Lack of Transportation (Non-Medical): No  Physical Activity: Not on file  Stress: Not on file  Social Connections: Not on file   Additional Social History:   Current Medications: Current Facility-Administered Medications  Medication Dose Route Frequency Provider Last Rate Last Admin   acetaminophen  (TYLENOL ) tablet 650 mg  650 mg Oral Q6H PRN Carrion-Carrero, Margely, MD       alum & mag hydroxide-simeth (MAALOX/MYLANTA) 200-200-20 MG/5ML suspension 30 mL  30 mL Oral Q4H PRN Carrion-Carrero, Margely, MD       diphenhydrAMINE  (BENADRYL ) capsule 25 mg  25 mg Oral Q6H PRN Carrion-Carrero, Margely, MD       haloperidol  (HALDOL ) tablet 5 mg  5 mg Oral TID PRN Carrion-Carrero, Margely, MD       And   diphenhydrAMINE  (BENADRYL ) capsule 50 mg  50 mg Oral TID PRN Carrion-Carrero, Margely, MD       haloperidol  lactate (HALDOL ) injection 5 mg  5 mg Intramuscular TID PRN Carrion-Carrero, Margely, MD       And   diphenhydrAMINE  (BENADRYL ) injection 50 mg  50 mg Intramuscular TID PRN Carrion-Carrero, Margely, MD       And   LORazepam  (ATIVAN ) injection 2 mg  2 mg Intramuscular TID PRN Carrion-Carrero, Margely, MD       haloperidol  lactate (HALDOL ) injection 10 mg  10 mg Intramuscular TID PRN Carrion-Carrero, Margely, MD       And   diphenhydrAMINE  (BENADRYL ) injection 50 mg  50 mg Intramuscular TID PRN Carrion-Carrero, Margely, MD       And   LORazepam  (ATIVAN ) injection 2 mg  2 mg Intramuscular TID PRN Carrion-Carrero, Marlo, MD       haloperidol  (HALDOL ) tablet 5 mg  5 mg Oral QHS  Massengill, Nathan, MD   5 mg at 07/08/23 2108   hydrOXYzine  (ATARAX ) tablet 25 mg  25 mg Oral TID PRN Homer Marlo, MD   25 mg at 07/07/23 2117   magnesium  hydroxide (MILK OF MAGNESIA) suspension 30 mL  30 mL Oral Daily PRN Carrion-Carrero, Marlo, MD       neomycin -bacitracin -polymyxin (NEOSPORIN) ointment   Topical BID Massengill, Nathan, MD       nicotine  (NICODERM CQ  - dosed in mg/24 hours) patch 14 mg  14 mg Transdermal Daily Massengill, Nathan, MD   14 mg at 07/08/23 9094   nicotine  polacrilex (NICORETTE ) gum 2 mg  2 mg Oral PRN Massengill, Rankin, MD       propranolol  (INDERAL ) tablet 10 mg  10 mg  Oral Q12H Massengill, Rankin, MD   10 mg at 07/09/23 1001   traZODone  (DESYREL ) tablet 50 mg  50 mg Oral QHS PRN Homer Shams, MD   50 mg at 07/08/23 2108   Lab Results:  Results for orders placed or performed during the hospital encounter of 07/07/23 (from the past 48 hours)  Lipid panel     Status: None   Collection Time: 07/07/23  6:15 PM  Result Value Ref Range   Cholesterol 117 0 - 200 mg/dL   Triglycerides 22 <849 mg/dL   HDL 56 >59 mg/dL   Total CHOL/HDL Ratio 2.1 RATIO   VLDL 4 0 - 40 mg/dL   LDL Cholesterol 57 0 - 99 mg/dL    Comment:        Total Cholesterol/HDL:CHD Risk Coronary Heart Disease Risk Table                     Men   Women  1/2 Average Risk   3.4   3.3  Average Risk       5.0   4.4  2 X Average Risk   9.6   7.1  3 X Average Risk  23.4   11.0        Use the calculated Patient Ratio above and the CHD Risk Table to determine the patient's CHD Risk.        ATP III CLASSIFICATION (LDL):  <100     mg/dL   Optimal  899-870  mg/dL   Near or Above                    Optimal  130-159  mg/dL   Borderline  839-810  mg/dL   High  >809     mg/dL   Very High Performed at [facility] [address] [place], [place] [address]   Hemoglobin A1c     Status: None   Collection Time: 07/07/23  6:15 PM  Result Value Ref Range    Hgb A1c MFr Bld 5.1 4.8 - 5.6 %    Comment: (NOTE)         Prediabetes: 5.7 - 6.4         Diabetes: >6.4         Glycemic control for adults with diabetes: <7.0    Mean Plasma Glucose 100 mg/dL    Comment: (NOTE) Performed At: [facility] [address] [place], [place] [id number] Jennette Shorter MD Ey:[id number]   TSH     Status: None   Collection Time: 07/07/23  6:15 PM  Result Value Ref Range   TSH 2.114 0.350 - 4.500 uIU/mL    Comment: Performed by a 3rd Generation assay with a functional sensitivity of <=0.01 uIU/mL. Performed at [facility], [address] [place], [place] [address]    Blood Alcohol level:  Lab Results  Component Value Date   [facility] <10 07/06/2023   ETH <10 01/24/2022   Metabolic Disorder Labs: Lab Results  Component Value Date   HGBA1C 5.1 07/07/2023   MPG 100 07/07/2023   MPG 79.58 08/07/2021   No results found for: PROLACTIN Lab Results  Component Value Date   CHOL 117 07/07/2023   TRIG 22 07/07/2023   HDL 56 07/07/2023   CHOLHDL 2.1 07/07/2023   VLDL 4 07/07/2023   LDLCALC 57 07/07/2023   LDLCALC 78 08/07/2021   Physical Findings: AIMS:  , ,  ,  ,    CIWA:  COWS:     Musculoskeletal: Strength & Muscle Tone: within normal limits Gait & Station: normal Patient leans: N/A  Psychiatric Specialty Exam:  Presentation  General Appearance:  Casual; Disheveled  Eye Contact: Good  Speech: Normal Rate; Clear and Coherent  Speech Volume: Normal  Handedness: Right   Mood and Affect  Mood: -- (Reports some improvement.)  Affect: Appropriate; Congruent   Thought Process  Thought Processes: Linear; Coherent  Descriptions of Associations:Intact  Orientation:Full (Time, Place and Person)  Thought Content:Logical  History of Schizophrenia/Schizoaffective disorder:Yes  Duration of Psychotic Symptoms:Greater than six months  Hallucinations:Hallucinations: None  Ideas of  Reference:None (+ thought insertion, thought control)  Suicidal Thoughts:Suicidal Thoughts: No  Homicidal Thoughts:Homicidal Thoughts: No   Sensorium  Memory: Immediate Fair; Recent Fair; Remote Fair  Judgment: Fair  Insight: Fair   Art Therapist  Concentration: Fair  Attention Span: Fair  Recall: Fair  Fund of Knowledge: Fair  Language: Good   Psychomotor Activity  Psychomotor Activity: Psychomotor Activity: Normal  Assets  Assets: Communication Skills; Desire for Improvement; Financial Resources/Insurance; Housing; Physical Health; Resilience; Social Support  Sleep  Sleep: Sleep: Good Number of Hours of Sleep: 10.5  Physical Exam: Physical Exam Vitals reviewed.  Constitutional:      General: He is not in acute distress.    Appearance: He is normal weight. He is not toxic-appearing.  Pulmonary:     Effort: Pulmonary effort is normal. No respiratory distress.  Neurological:     Mental Status: He is alert.     Motor: No weakness.     Gait: Gait normal.  Psychiatric:        Behavior: Behavior normal.    Review of Systems  Constitutional:  Negative for chills and fever.  Cardiovascular:  Negative for chest pain and palpitations.  Neurological:  Negative for dizziness, tingling, tremors and headaches.  Psychiatric/Behavioral:  Positive for hallucinations. Negative for depression, memory loss, substance abuse and suicidal ideas. The patient is nervous/anxious. The patient does not have insomnia.   All other systems reviewed and are negative.  Blood pressure 112/71, pulse 85, temperature 97.8 F (36.6 C), temperature source Oral, resp. rate 18, height 5' 10 (1.778 m), weight 62.1 kg, SpO2 99%. Body mass index is 19.66 kg/m.   Treatment Plan Summary: Daily contact with patient to assess and evaluate symptoms and progress in treatment and Medication management  ASSESSMENT:   Diagnoses / Active Problems: Schizophrenia, type unspecified  versus schizoaffective disorder (by history)    History of methamphetamine use disorder  History of cannabis use disorder Tobacco use    PLAN: Safety and Monitoring:             --  Voluntary admission to inpatient psychiatric unit for safety, stabilization and treatment             -- Daily contact with patient to assess and evaluate symptoms and progress in treatment             -- Patient's case to be discussed in multi-disciplinary team meeting             -- Observation Level : q15 minute checks             -- Vital signs:  q12 hours             -- Precautions: suicide, elopement, and assault   2. Psychiatric Diagnoses and Treatment:               -D/c Invega  due to  high cost of LAI (over $1k per month). -Continue haldol  5 mg at bedtime for psychosis. Plan for Awanda which is $8 per month per pharmacy.  -Continue Propranolol  10 mg po bid for anxiety.  -Continue Trazodone  50 mg po Q bedtime prn for insomnia.  -Continue Hydroxyzine  25 mg po tid prn for anxiety.  --  The risks/benefits/side-effects/alternatives to this medication were discussed in detail with the patient and time was given for questions. The patient consents to medication trial.                -- Metabolic profile and EKG monitoring obtained while on an atypical antipsychotic (BMI: Lipid Panel: HbgA1c: QTc:)              -- Encouraged patient to participate in unit milieu and in scheduled group therapies                   3. Medical Issues Being Addressed:              Tobacco Use Disorder             -- Nicotine  patch 21mg /24 hours ordered             -- Smoking cessation encouraged   4. Discharge Planning:              -- Social work and case management to assist with discharge planning and identification of hospital follow-up needs prior to discharge             -- Estimated LOS: 4-5 more days             -- Discharge Concerns: Need to establish a safety plan; Medication compliance and effectiveness              -- Discharge Goals: Return home with outpatient referrals for mental health follow-up including medication management/psychotherapy   Mac Bolster, NP, pmhnp, fnp-bc. 07/09/2023, 2:50 PM Patient ID: Jerry Garrison, male   DOB: 15-Jul-2000, 23 y.o.   MRN: [id number]

## 2023-07-09 NOTE — Plan of Care (Signed)
  Problem: Education: Goal: Emotional status will improve Outcome: Progressing Goal: Verbalization of understanding the information provided will improve Outcome: Progressing   Problem: Activity: Goal: Interest or engagement in activities will improve Outcome: Progressing   Problem: Coping: Goal: Ability to verbalize frustrations and anger appropriately will improve Outcome: Progressing Goal: Ability to demonstrate self-control will improve Outcome: Progressing   Problem: Health Behavior/Discharge Planning: Goal: Identification of resources available to assist in meeting health care needs will improve Outcome: Progressing

## 2023-07-09 NOTE — Group Note (Signed)
 Date:  07/09/2023 Time:  10:58 AM  Group Topic/Focus:  Goals Group:   The focus of this group is to help patients establish daily goals to achieve during treatment and discuss how the patient can incorporate goal setting into their daily lives to aide in recovery. Orientation:   The focus of this group is to educate the patient on the purpose and policies of crisis stabilization and provide a format to answer questions about their admission.  The group details unit policies and expectations of patients while admitted.    Participation Level:  Did Not Attend  Participation Quality:   n/a  Affect:   n/a  Cognitive:   n/a  Insight: None  Engagement in Group:   n/a  Modes of Intervention:   n/a  Additional Comments:   Pt did not attend the group.  Addison HERO Syanna Remmert 07/09/2023, 10:58 AM

## 2023-07-09 NOTE — Progress Notes (Addendum)
 Patient denies SI, HI and AVH. Patient was very difficult to wake up for morning medications. Patient was pleasant and cooperative later and throughout the shift, attending group and spending time in the day room. Patient adherent to all scheduled medications wwith the exception of neosporin patient states he no longer needs it and scratches are superficial and healed. No adverse effects were noted. No PRNs were administered this shift.Patient remains safe on the unit Q 15 minute safety checks in place.   07/09/23 1000  Psych Admission Type (Psych Patients Only)  Admission Status Voluntary  Psychosocial Assessment  Patient Complaints None  Eye Contact Fair  Facial Expression Animated  Affect Appropriate to circumstance  Speech Logical/coherent  Interaction Assertive  Motor Activity Other (Comment) (WNL)  Appearance/Hygiene Unremarkable  Behavior Characteristics Cooperative  Mood Pleasant  Thought Process  Coherency WDL  Content WDL  Delusions None reported or observed  Perception WDL  Hallucination None reported or observed  Judgment Limited  Confusion None  Danger to Self  Current suicidal ideation? Denies  Agreement Not to Harm Self Yes  Description of Agreement verbal  Danger to Others  Danger to Others None reported or observed

## 2023-07-09 NOTE — Group Note (Signed)
 Recreation Therapy Group Note   Group Topic:Team Building  Group Date: 07/09/2023 Start Time: 0930 End Time: 9046 Facilitators: Marlaysia Lenig-McCall, LRT,CTRS Location: [address] Dayroom   Group Topic: Communication, Team Building, Problem Solving  Goal Area(s) Addresses:  Patient will effectively work with peer towards shared goal.  Patient will identify skills used to make activity successful.  Patient will identify how skills used during activity can be used to reach post d/c goals.   Intervention: STEM Activity  Group Description: Straw Bridge. In teams of 3-5, patients were given 15 plastic drinking straws and an equal length of masking tape. Using the materials provided, patients were instructed to build a free standing bridge-like structure to suspend an everyday item (ex: puzzle box) off of the floor or table surface. All materials were required to be used by the team in their design. LRT facilitated post-activity discussion reviewing team process. Patients were encouraged to reflect how the skills used in this activity can be generalized to daily life post discharge.   Education: Pharmacist, Community, Scientist, Physiological, Discharge Planning   Education Outcome: Acknowledges education/In group clarification offered/Needs additional education.    Affect/Mood: N/A   Participation Level: Did not attend    Clinical Observations/Individualized Feedback:      Plan: Continue to engage patient in RT group sessions 2-3x/week.   Yuka Lallier-McCall, LRT,CTRS 07/09/2023 11:49 AM

## 2023-07-09 NOTE — Plan of Care (Signed)

## 2023-07-09 NOTE — Progress Notes (Signed)
   07/09/23 0100  Psych Admission Type (Psych Patients Only)  Admission Status Voluntary  Psychosocial Assessment  Patient Complaints Anxiety  Eye Contact Brief  Facial Expression Fixed smile  Affect Appropriate to circumstance  Speech Logical/coherent  Interaction Assertive  Motor Activity Other (Comment) (WNL)  Appearance/Hygiene Unremarkable  Behavior Characteristics Cooperative  Mood Pleasant;Euthymic  Thought Process  Coherency WDL  Content WDL  Delusions None reported or observed  Perception WDL  Hallucination None reported or observed  Judgment Limited  Confusion None  Danger to Self  Current suicidal ideation? Denies  Description of Suicide Plan None  Agreement Not to Harm Self Yes  Description of Agreement Verbal contract for safety  Danger to Others  Danger to Others None reported or observed

## 2023-07-10 DIAGNOSIS — F203 Undifferentiated schizophrenia: Secondary | ICD-10-CM | POA: Diagnosis not present

## 2023-07-10 MED ORDER — PROPRANOLOL HCL 10 MG PO TABS
5.0000 mg | ORAL_TABLET | Freq: Two times a day (BID) | ORAL | Status: DC
  Administered 2023-07-10 – 2023-07-11 (×2): 5 mg via ORAL
  Filled 2023-07-10 (×4): qty 0.5

## 2023-07-10 NOTE — Plan of Care (Signed)
  Problem: Activity: Goal: Interest or engagement in activities will improve Outcome: Progressing   Problem: Safety: Goal: Periods of time without injury will increase Outcome: Progressing

## 2023-07-10 NOTE — Progress Notes (Signed)
 Nursing Note: 0700-1900    Goal for today:  Make it through the day and be as independent as needed.   Pt reports that he slept well last night, appetite is good and is tolerating prescribed medication without side effects.  Rates both anxiety and depression 0/10 this am, hopelessness 1/10.  Denies A/V hallucinations and is able to verbally contract for safety. Pt active in milieu, pleasant with peers and staff.  Pt. encouraged to verbalize needs and concerns, active listening and support provided.  Continued Q 15 minute safety checks.  Observed active participation in group settings.   07/10/23 0900  Psych Admission Type (Psych Patients Only)  Admission Status Voluntary  Psychosocial Assessment  Patient Complaints None  Eye Contact Fair  Facial Expression Animated  Affect Appropriate to circumstance  Speech Logical/coherent  Interaction Assertive  Motor Activity Other (Comment) (Unremarkable.)  Appearance/Hygiene Unremarkable  Behavior Characteristics Cooperative  Mood Pleasant  Thought Process  Coherency WDL  Content WDL  Delusions None reported or observed  Perception WDL  Hallucination None reported or observed  Judgment Impaired  Confusion None  Danger to Self  Current suicidal ideation? Denies  Agreement Not to Harm Self Yes  Description of Agreement Verbal  Danger to Others  Danger to Others None reported or observed

## 2023-07-10 NOTE — Progress Notes (Signed)
 D: Pt alert and oriented. Pt rates depression 3/10 and anxiety 4/10.  Pt denies experiencing any SI/HI, or AVH at this time.   A: Scheduled medications administered to pt, per MD orders. Support and encouragement provided. Frequent verbal contact made. Routine safety checks conducted q15 minutes.   R: No adverse drug reactions noted. Pt verbally contracts for safety at this time. Pt complaint with medications and treatment plan. Pt interacts well with others on the unit. Pt remains safe at this time. Will continue to monitor.

## 2023-07-10 NOTE — Plan of Care (Signed)
  Problem: Education: Goal: Knowledge of [facility] Education information/materials will improve Outcome: Progressing Goal: Emotional status will improve Outcome: Progressing Goal: Mental status will improve Outcome: Progressing Goal: Verbalization of understanding the information provided will improve Outcome: Progressing   Problem: Activity: Goal: Interest or engagement in activities will improve Outcome: Progressing   Problem: Coping: Goal: Ability to verbalize frustrations and anger appropriately will improve Outcome: Progressing Goal: Ability to demonstrate self-control will improve Outcome: Progressing   Problem: Coping: Goal: Ability to verbalize frustrations and anger appropriately will improve Outcome: Progressing Goal: Ability to demonstrate self-control will improve Outcome: Progressing

## 2023-07-10 NOTE — Progress Notes (Signed)
   07/10/23 2130  Psych Admission Type (Psych Patients Only)  Admission Status Voluntary  Psychosocial Assessment  Patient Complaints None  Eye Contact Fair  Facial Expression Animated  Affect Appropriate to circumstance  Speech Logical/coherent  Interaction Assertive  Motor Activity Other (Comment) (WDL)  Appearance/Hygiene Unremarkable  Behavior Characteristics Cooperative  Mood Pleasant  Thought Process  Coherency WDL  Content WDL  Delusions None reported or observed  Perception WDL  Hallucination None reported or observed  Judgment Impaired  Confusion None  Danger to Self  Current suicidal ideation? Passive  Description of Suicide Plan No plan  Agreement Not to Harm Self Yes  Description of Agreement verbal  Danger to Others  Danger to Others None reported or observed

## 2023-07-10 NOTE — Plan of Care (Signed)
   Problem: Education: Goal: Knowledge of [facility] Education information/materials will improve Outcome: Progressing

## 2023-07-10 NOTE — Progress Notes (Signed)
 [facility] MD Progress Note  07/10/2023 9:21 AM Jerry Garrison  MRN:  [id number]  Subjective:    Patient is a 23 year old male with a psychiatric history schizophrenia versus schizoaffective disorder and methamphetamine abuse, who was admitted to the psychiatric unit from the [facility] for evaluation and treatment of self-harm in the context of worsening psychosis.   On assessment today, patient continues to complain of daytime fatigue.  We discussed that it is difficult to determine the cause of this, it could be the Haldol , but also could be the propranolol  that was started 2 days ago, the trazodone  as needed that he is getting at night, or the hydroxyzine  as needed he is getting at night.  We discussed that for now we will discontinue the trazodone , change the order for the hydroxyzine  so is not offered this at night, we will decrease the propranolol  now that his tachycardia has improved.  Will continue with the Haldol  at current dose for now.  It seems the disorganization of thinking is improved with antipsychotic treatment. We discussed that we will consider administering the LAI tomorrow, if we can address the daytime sedation.  Patient states that no one has visited him because he thought he was not allowed to have visitors, we discussed that we do actually in fact encourage people to have visitors.  We discussed him to ask his mother to visit tonight, so that we can follow up with her on his progress tomorrow.  Otherwise the patient denies any AH or VH.  Reports that sleep is stable.  Reports appetite is okay.  Concentration is better.  Denies depression or sadness.  Reports anxiety is less.  Outside of the daytime sedation he denies side effects to current psychiatric medications.  We discussed discharge planning as early as Monday, if we are confident in administering the LAI tomorrow.  Patient is agreeable.    Principal Problem: Schizophrenia, unspecified (HCC) Diagnosis: Principal Problem:    Schizophrenia, unspecified (HCC) Active Problems:   Amphetamine use disorder, severe (HCC)  Total Time spent with patient: 15 minutes  Past Psychiatric History:  Previous diagnosis of schizoaffective disorder versus schizophrenia.  History of narcotic overdose, requiring medical admission for aspiration pneumonia after vomiting while he was obtunded. Patient reports a history of about 4 psychiatric hospitalizations in the past, his most recent admission to this hospital was in July 2023.  He also reports that he was admitted to a psychiatric hospital in [place]  around February 2024 because he was stalking his child's mother, and the police were called and took him to the hospital.  He reports not taking medication for very long after discharge from the hospital.  Patient reports 1 suicide attempt in the past by overdose in July 2023. Past psychiatric medications: Zyprexa  and Invega .  Lexapro .   Past Medical History:  Past Medical History:  Diagnosis Date   Schizophrenia (HCC)    Substance abuse (HCC)     Past Surgical History:  Procedure Laterality Date   NO PAST SURGERIES     Family History: History reviewed. No pertinent family history. Family Psychiatric  History: See H&P Social History:  Social History   Substance and Sexual Activity  Alcohol Use No     Social History   Substance and Sexual Activity  Drug Use Yes   Types: Marijuana, Oxycodone    Social History   Socioeconomic History   Marital status: Single    Spouse name: Not on file   Number of children: Not on file  Years of education: Not on file   Highest education level: Not on file  Occupational History   Not on file  Tobacco Use   Smoking status: Never   Smokeless tobacco: Never  Vaping Use   Vaping status: Never Used  Substance and Sexual Activity   Alcohol use: No   Drug use: Yes    Types: Marijuana, Oxycodone   Sexual activity: Not Currently  Other Topics Concern   Not on file  Social  History Narrative   Not on file   Social Drivers of Health   Financial Resource Strain: Not on file  Food Insecurity: No Food Insecurity (07/06/2023)   Hunger Vital Sign    Worried About Running Out of Food in the Last Year: Never true    Ran Out of Food in the Last Year: Never true  Transportation Needs: No Transportation Needs (07/06/2023)   PRAPARE - Administrator, Civil Service (Medical): No    Lack of Transportation (Non-Medical): No  Physical Activity: Not on file  Stress: Not on file  Social Connections: Not on file   Additional Social History:                          Current Medications: Current Facility-Administered Medications  Medication Dose Route Frequency Provider Last Rate Last Admin   acetaminophen  (TYLENOL ) tablet 650 mg  650 mg Oral Q6H PRN Carrion-Carrero, Margely, MD       alum & mag hydroxide-simeth (MAALOX/MYLANTA) 200-200-20 MG/5ML suspension 30 mL  30 mL Oral Q4H PRN Carrion-Carrero, Margely, MD       diphenhydrAMINE  (BENADRYL ) capsule 25 mg  25 mg Oral Q6H PRN Carrion-Carrero, Margely, MD       haloperidol  (HALDOL ) tablet 5 mg  5 mg Oral TID PRN Carrion-Carrero, Margely, MD       And   diphenhydrAMINE  (BENADRYL ) capsule 50 mg  50 mg Oral TID PRN Carrion-Carrero, Margely, MD       haloperidol  lactate (HALDOL ) injection 5 mg  5 mg Intramuscular TID PRN Carrion-Carrero, Margely, MD       And   diphenhydrAMINE  (BENADRYL ) injection 50 mg  50 mg Intramuscular TID PRN Carrion-Carrero, Margely, MD       And   LORazepam  (ATIVAN ) injection 2 mg  2 mg Intramuscular TID PRN Carrion-Carrero, Margely, MD       haloperidol  lactate (HALDOL ) injection 10 mg  10 mg Intramuscular TID PRN Carrion-Carrero, Margely, MD       And   diphenhydrAMINE  (BENADRYL ) injection 50 mg  50 mg Intramuscular TID PRN Carrion-Carrero, Margely, MD       And   LORazepam  (ATIVAN ) injection 2 mg  2 mg Intramuscular TID PRN Carrion-Carrero, Marlo, MD       haloperidol   (HALDOL ) tablet 5 mg  5 mg Oral QHS Yasiel Goyne, MD   5 mg at 07/09/23 2149   hydrOXYzine  (ATARAX ) tablet 25 mg  25 mg Oral TID PRN Johny Lot, MD   25 mg at 07/07/23 2117   magnesium  hydroxide (MILK OF MAGNESIA) suspension 30 mL  30 mL Oral Daily PRN Carrion-Carrero, Marlo, MD       neomycin -bacitracin -polymyxin (NEOSPORIN) ointment   Topical BID Prakriti Carignan, MD       nicotine  (NICODERM CQ  - dosed in mg/24 hours) patch 14 mg  14 mg Transdermal Daily Meisha Salone, MD   14 mg at 07/10/23 0815   nicotine  polacrilex (NICORETTE ) gum 2 mg  2 mg  Oral PRN Denica Web, Rankin, MD       propranolol  (INDERAL ) tablet 5 mg  5 mg Oral Q12H Tycen Dockter, MD        Lab Results: No results found for this or any previous visit (from the past 48 hours).  Blood Alcohol level:  Lab Results  Component Value Date   ETH <10 07/06/2023   ETH <10 01/24/2022    Metabolic Disorder Labs: Lab Results  Component Value Date   HGBA1C 5.1 07/07/2023   MPG 100 07/07/2023   MPG 79.58 08/07/2021   No results found for: PROLACTIN Lab Results  Component Value Date   CHOL 117 07/07/2023   TRIG 22 07/07/2023   HDL 56 07/07/2023   CHOLHDL 2.1 07/07/2023   VLDL 4 07/07/2023   LDLCALC 57 07/07/2023   LDLCALC 78 08/07/2021    Physical Findings: AIMS:  , ,  ,  ,    CIWA:    COWS:     Aims score zero on my exam. No eps on my exam.   Musculoskeletal: Strength & Muscle Tone: within normal limits Gait & Station: normal Patient leans: N/A  Psychiatric Specialty Exam:  Presentation  General Appearance:  Casual  Eye Contact: Fair  Speech: Normal Rate; Clear and Coherent  Speech Volume: Normal  Handedness: Right   Mood and Affect  Mood: Euthymic  Affect: Congruent; Full Range   Thought Process  Thought Processes: Linear  Descriptions of Associations:Intact  Orientation:Full (Time, Place and Person)  Thought Content:Logical  History of  Schizophrenia/Schizoaffective disorder:Yes  Duration of Psychotic Symptoms:Greater than six months  Hallucinations:Hallucinations: None  Ideas of Reference:None  Suicidal Thoughts:Suicidal Thoughts: No  Homicidal Thoughts:Homicidal Thoughts: No   Sensorium  Memory: Immediate Fair; Recent Fair; Remote Fair  Judgment: Fair  Insight: Fair   Art Therapist  Concentration: Poor  Attention Span: Poor  Recall: [employer] of Knowledge: Fair  Language: Good   Psychomotor Activity  Psychomotor Activity: Psychomotor Activity: Normal   Assets  Assets: Communication Skills; Desire for Improvement; Financial Resources/Insurance; Housing; Physical Health; Resilience; Social Support   Sleep  Sleep: Sleep: Fair    Physical Exam: Physical Exam Vitals reviewed.  Constitutional:      General: He is not in acute distress.    Appearance: He is normal weight. He is not toxic-appearing.  Pulmonary:     Effort: Pulmonary effort is normal. No respiratory distress.  Neurological:     Mental Status: He is alert.     Motor: No weakness.     Gait: Gait normal.  Psychiatric:        Mood and Affect: Mood normal.        Behavior: Behavior normal.        Thought Content: Thought content normal.        Judgment: Judgment normal.    Review of Systems  Constitutional:  Negative for chills and fever.  Cardiovascular:  Negative for chest pain and palpitations.  Neurological:  Negative for dizziness, tingling, tremors and headaches.  Psychiatric/Behavioral:  Positive for substance abuse. Negative for depression, hallucinations, memory loss and suicidal ideas. The patient is not nervous/anxious and does not have insomnia.   All other systems reviewed and are negative.  Blood pressure 115/72, pulse 85, temperature 97.7 F (36.5 C), temperature source Oral, resp. rate 17, height 5' 10 (1.778 m), weight 62.1 kg, SpO2 100%. Body mass index is 19.66 kg/m.   Treatment  Plan Summary: Daily contact with patient to assess and evaluate symptoms  and progress in treatment and Medication management  Daily contact with patient to assess and evaluate symptoms and progress in treatment and Medication management   ASSESSMENT:   Diagnoses / Active Problems: Schizophrenia, type unspecified versus schizoaffective disorder (by history)    History of methamphetamine use disorder  History of cannabis use disorder Tobacco use    PLAN: Safety and Monitoring:             --  Voluntary admission to inpatient psychiatric unit for safety, stabilization and treatment             -- Daily contact with patient to assess and evaluate symptoms and progress in treatment             -- Patient's case to be discussed in multi-disciplinary team meeting             -- Observation Level : q15 minute checks             -- Vital signs:  q12 hours             -- Precautions: suicide, elopement, and assault   2. Psychiatric Diagnoses and Treatment:               -Previously discontinued Invega  due to high cost of LAI (over $1k per month for pharmacy). -Continue haldol  5 mg at bedtime for psychosis. Plan for Awanda which is $8 per month per pharmacy.  -Decrease propranolol  from 10 mg bid -> to 5 mg bid - decrease due to daytime sedation.  Prescribed for tachycardia and anxiety.  -Discontinue trazodone  50 mg po Q bedtime prn for insomnia.  -Continue Hydroxyzine  25 mg po tid prn for anxiety.  Do not give at bedtime, due to daytime sedation.   --  The risks/benefits/side-effects/alternatives to this medication were discussed in detail with the patient and time was given for questions. The patient consents to medication trial.                -- Metabolic profile and EKG monitoring obtained while on an atypical antipsychotic (BMI: Lipid Panel: HbgA1c: QTc:)              -- Encouraged patient to participate in unit milieu and in scheduled group therapies                   3. Medical Issues  Being Addressed:              Tobacco Use Disorder             -- Nicotine  patch 21mg /24 hours ordered             -- Smoking cessation encouraged   4. Discharge Planning:              -- Social work and case management to assist with discharge planning and identification of hospital follow-up needs prior to discharge             -- Estimated LOS: 2-3 more days             -- Discharge Concerns: Need to establish a safety plan; Medication compliance and effectiveness             -- Discharge Goals: Return home with outpatient referrals for mental health follow-up including medication management/psychotherapy   Rankin Montes, MD 07/10/2023, 9:21 AM  Total Time Spent in Direct Patient Care:  I personally spent 35 minutes on the unit in direct patient care. The direct  patient care time included face-to-face time with the patient, reviewing the patient's chart, communicating with other professionals, and coordinating care. Greater than 50% of this time was spent in counseling or coordinating care with the patient regarding goals of hospitalization, psycho-education, and discharge planning needs.   Rankin Montes, MD Psychiatrist

## 2023-07-10 NOTE — Group Note (Signed)
 Date:  07/10/2023 Time:  1:11 PM  Group Topic/Focus:  Goals Group:   The focus of this group is to help patients establish daily goals to achieve during treatment and discuss how the patient can incorporate goal setting into their daily lives to aide in recovery. Orientation:   The focus of this group is to educate the patient on the purpose and policies of crisis stabilization and provide a format to answer questions about their admission.  The group details unit policies and expectations of patients while admitted.    Participation Level:  Active  Participation Quality:  Appropriate  Affect:  Appropriate  Cognitive:  Appropriate  Insight: Appropriate  Engagement in Group:  Engaged  Modes of Intervention:  Discussion and Orientation  Additional Comments:   Pt attended and participated in the Orientation/Goals group. Pt was attentive during Orientation. Pt personal goal for today is to continue learning and staying open minded.  Addison HERO Mayda Shippee 07/10/2023, 1:11 PM

## 2023-07-10 NOTE — [facility] Group Notes (Signed)
 [facility] Group Notes:  (Nursing/MHT/Case Management/Adjunct)  Date:  07/10/2023  Time:  Aug 14, 2000  Type of Therapy:   Wrap up group  Participation Level:  Active  Participation Quality:  Appropriate, Attentive, Sharing, and Supportive  Affect:  Appropriate  Cognitive:  Alert  Insight:  Improving  Engagement in Group:  Engaged  Modes of Intervention:  Clarification, Education, and Socialization  Summary of Progress/Problems: Positive thinking and self-care were discussed.   Lenora Manuelita RAMAN 07/10/2023, 9:47 PM

## 2023-07-11 DIAGNOSIS — F203 Undifferentiated schizophrenia: Secondary | ICD-10-CM | POA: Diagnosis not present

## 2023-07-11 IMAGING — CT CT RENAL STONE PROTOCOL
2 of 4 series · 15 of 46 positions shown, 17 images · non-contrast
Comparison: Abdominal radiograph dated 09/22/2011.

CLINICAL DATA: Flank pain.



[Series 2: axial st · axial · 0.69mm/px · z∈[-445,-25]mm · 12 of 97 slices shown, 14 images]
[im 7/97  soft-tissue]
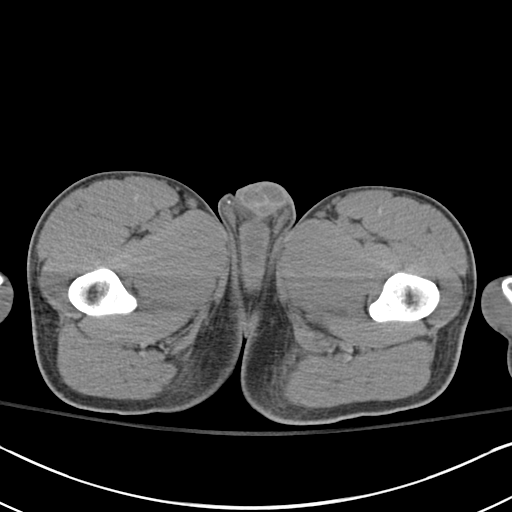
[im 7/97  bone]
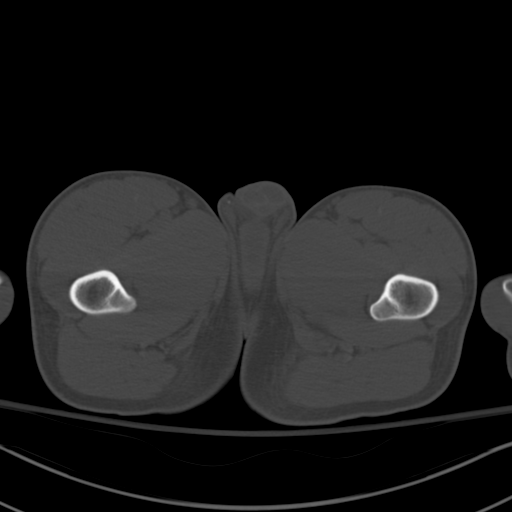
[im 13/97  soft-tissue]
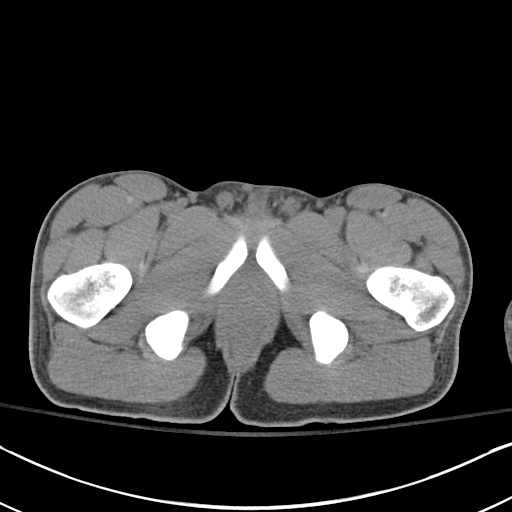
[im 25/97  soft-tissue]
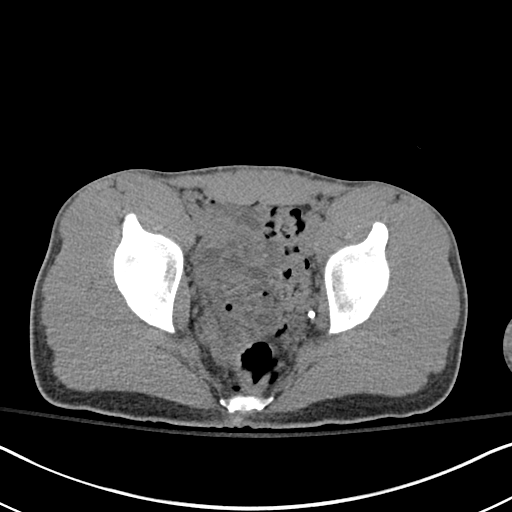
[im 31/97  soft-tissue]
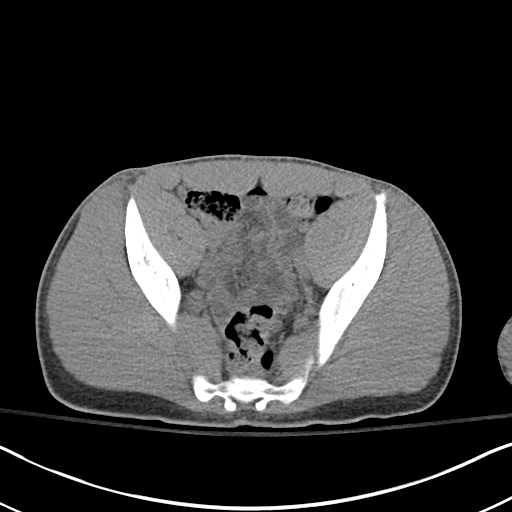
[im 37/97  soft-tissue]
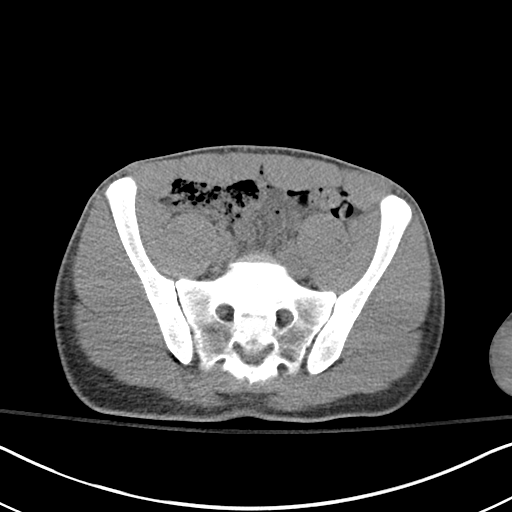
[im 43/97  soft-tissue]
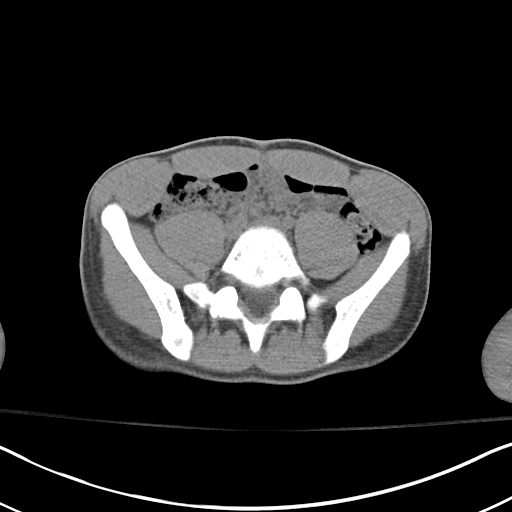
[im 55/97  soft-tissue]
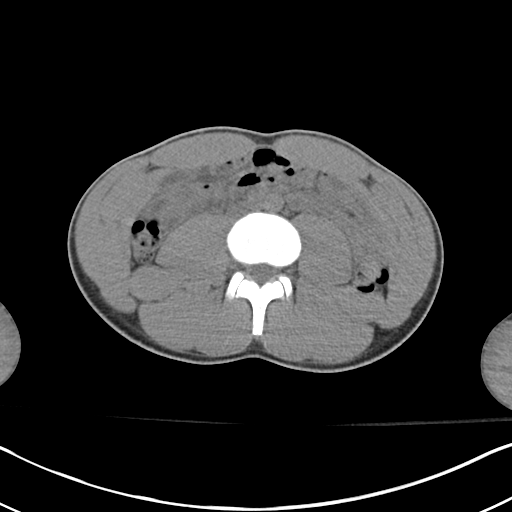
[im 61/97  soft-tissue]
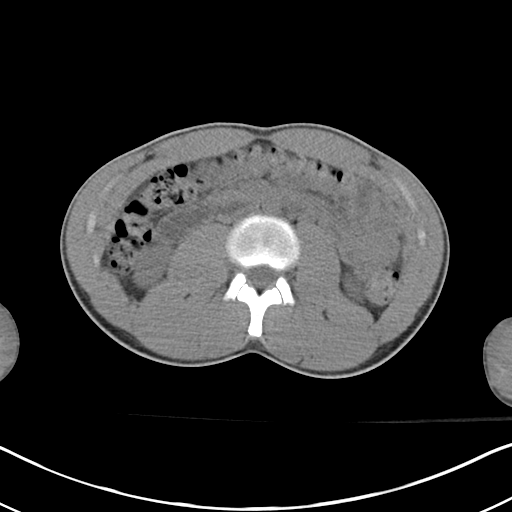
[im 67/97  soft-tissue]
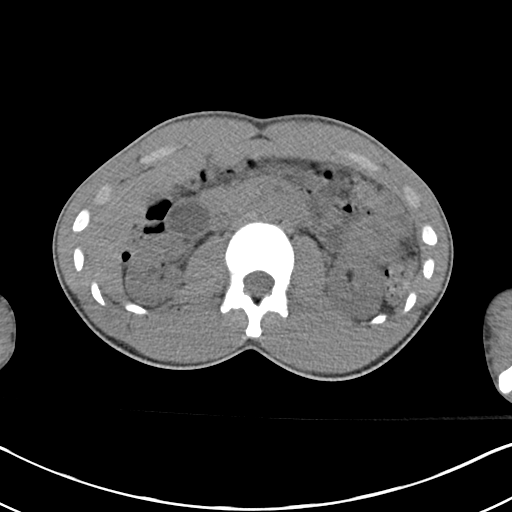
[im 67/97  bone]
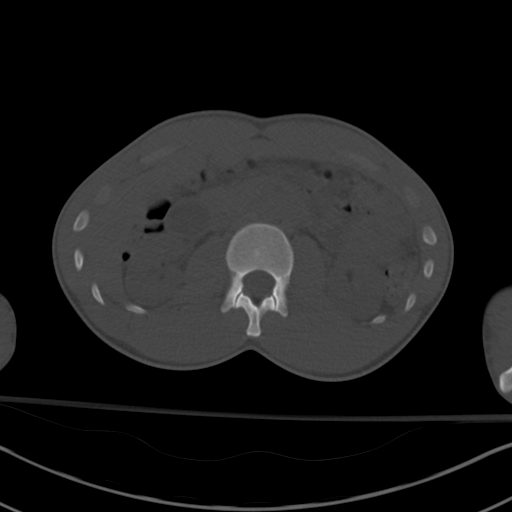
[im 73/97  soft-tissue]
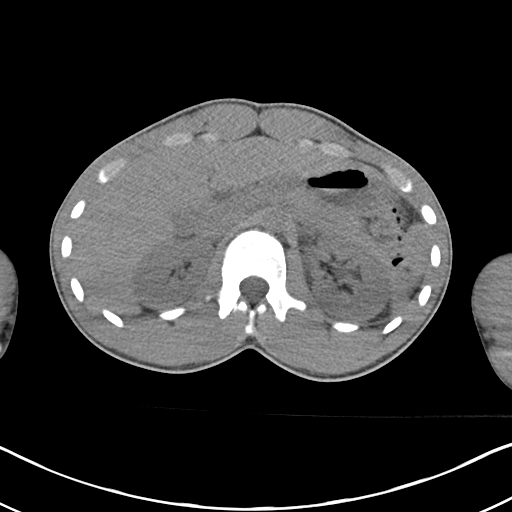
[im 85/97  soft-tissue]
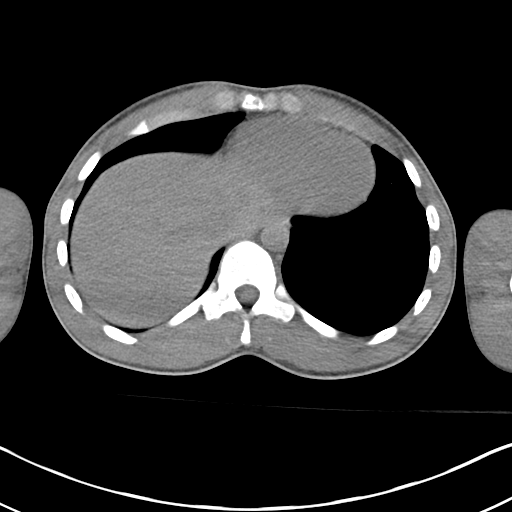
[im 91/97  soft-tissue]
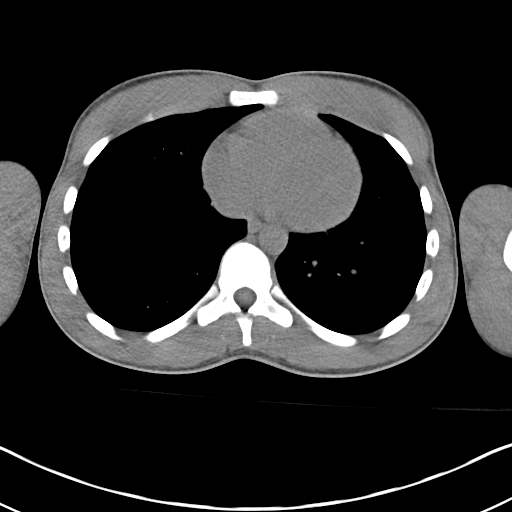

[Series 4: coronal · coronal · 0.68mm/px · 3 of 125 slices shown]
[im 42/125  soft-tissue]
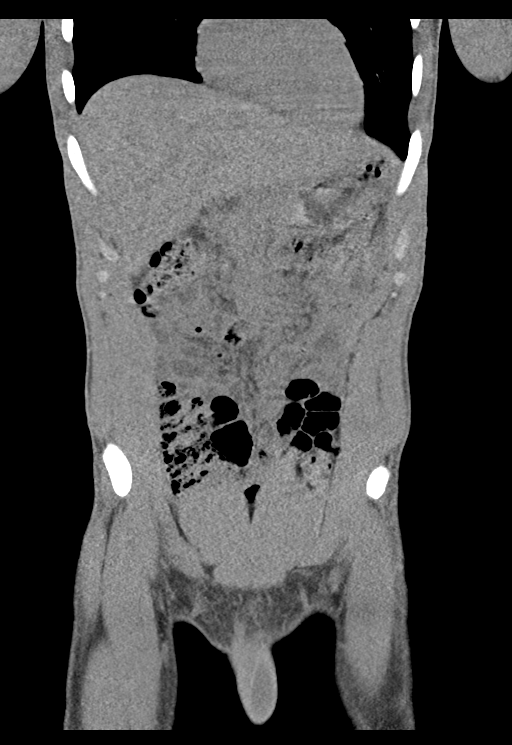
[im 56/125  soft-tissue]
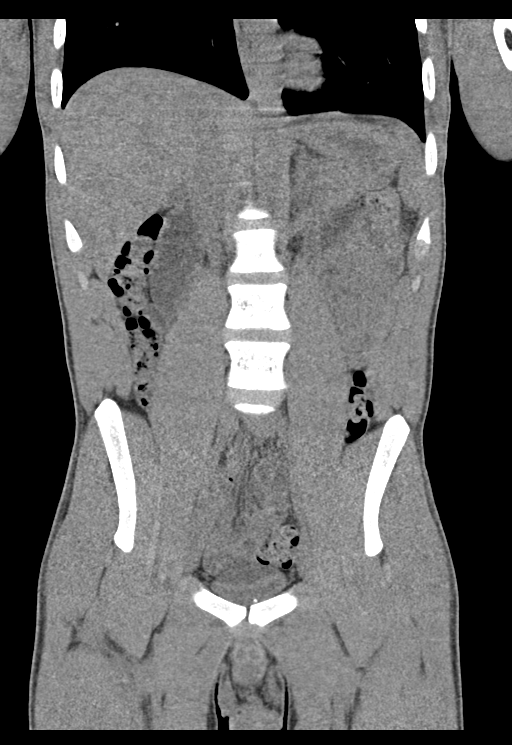
[im 69/125  soft-tissue]
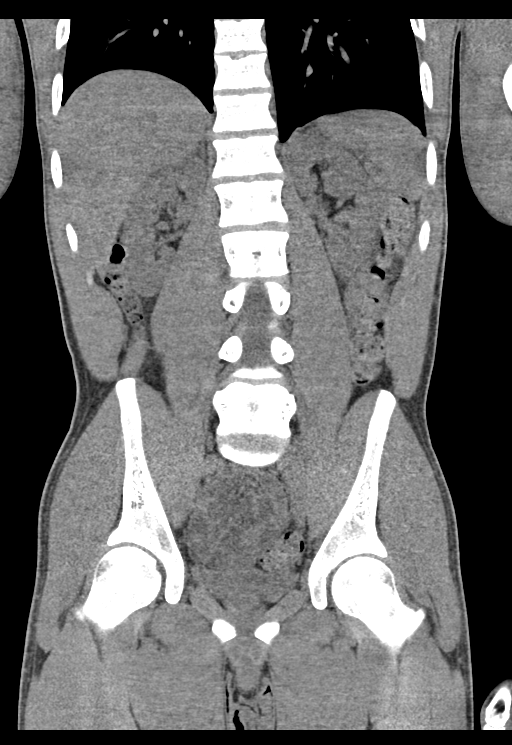

[15 of 46 positions shown; findings below may reference images not displayed]

FINDINGS: Evaluation of this exam is very limited in the absence of
intravenous contrast as well as paucity of abdominal fat and due to
streak artifact caused by patient's arms.

Lower chest: The visualized lung bases are clear.

No intra-abdominal free air or free fluid.

Hepatobiliary: No focal liver abnormality is seen. No gallstones,
gallbladder wall thickening, or biliary dilatation.

Pancreas: Unremarkable. No pancreatic ductal dilatation or
surrounding inflammatory changes.

Spleen: Normal in size without focal abnormality.

Adrenals/Urinary Tract: Adrenal glands are unremarkable. Kidneys are
normal, without renal calculi, focal lesion, or hydronephrosis.
Bladder is unremarkable.

Stomach/Bowel: There is no bowel obstruction or active inflammation.
The appendix is not visualized with certainty. No inflammatory
changes identified in the right lower quadrant.

Vascular/Lymphatic: The abdominal aorta and IVC are grossly
unremarkable on this noncontrast CT. No portal venous gas. There is
no adenopathy.

Reproductive: The prostate and seminal vesicles are grossly
unremarkable no pelvic mass.

Other: None

Musculoskeletal: No acute or significant osseous findings.
IMPRESSION: No acute intra-abdominal or pelvic pathology. No hydronephrosis or
nephrolithiasis.

## 2023-07-11 MED ORDER — HALOPERIDOL DECANOATE 100 MG/ML IM SOLN
50.0000 mg | INTRAMUSCULAR | Status: DC
  Administered 2023-07-11: 50 mg via INTRAMUSCULAR
  Filled 2023-07-11: qty 0.5
  Filled 2023-07-11: qty 1

## 2023-07-11 NOTE — Group Note (Signed)
 LCSW Group Therapy Note  Group Date: 07/11/2023 Start Time: 1000 End Time: 1100   Type of Therapy and Topic:  Group Therapy: Positive Affirmations  Participation Level:  Active   Description of Group:   This group addressed positive affirmation towards self and others.  Patients went around the room and identified two positive things about themselves and two positive things about a peer in the room.  Patients reflected on how it felt to share something positive with others, to identify positive things about themselves, and to hear positive things from others/ Patients were encouraged to have a daily reflection of positive characteristics or circumstances.   Therapeutic Goals: Patients will verbalize two of their positive qualities Patients will demonstrate empathy for others by stating two positive qualities about a peer in the group Patients will verbalize their feelings when voicing positive self affirmations and when voicing positive affirmations of others Patients will discuss the potential positive impact on their wellness/recovery of focusing on positive traits of self and others.  Summary of Patient Progress:  He actively engaged in the discussion and . He was able to identify a word that describes where he currently is in his life healing Positive affirmations about hisself as well as other group members. Patient demonstrated his insight into the subject matter, was respectful of peers, participated throughout the entire session.He reported having awareness and everything around him would help him during his recovery.   Therapeutic Modalities:   Cognitive Behavioral Therapy Motivational Interviewing    Golda Louder, ISRA[place] 07/11/2023  3:31 PM

## 2023-07-11 NOTE — Group Note (Signed)
 Date:  07/11/2023 Time:  6:45 PM  Group Topic/Focus:  Goals Group:   The focus of this group is to help patients establish daily goals to achieve during treatment and discuss how the patient can incorporate goal setting into their daily lives to aide in recovery. Orientation:   The focus of this group is to educate the patient on the purpose and policies of crisis stabilization and provide a format to answer questions about their admission.  The group details unit policies and expectations of patients while admitted.    Participation Level:  Did Not Attend   Jerry Garrison 07/11/2023, 6:45 PM

## 2023-07-11 NOTE — Progress Notes (Signed)
   07/11/23 0557  15 Minute Checks  Location Bedroom  Visual Appearance Calm  Behavior Sleeping  Sleep (Behavioral Health Patients Only)  Calculate sleep? (Click Yes once per 24 hr at 0600 safety check) Yes  Documented sleep last 24 hours 8.75

## 2023-07-11 NOTE — Progress Notes (Signed)
 [facility] MD Progress Note  07/11/2023 9:13 AM Jerry Garrison  MRN:  [id number]  Subjective:    Patient is a 23 year old male with a psychiatric history schizophrenia versus schizoaffective disorder and methamphetamine abuse, who was admitted to the psychiatric unit from the [facility] for evaluation and treatment of self-harm in the context of worsening psychosis.   On assessment today, pt reports daytime sedation is less. Reports sleep is okay w/o trazodone . Reports mood is good, not depressed. Reports having passive SI last night when thinking about the situation with his ex, but was able to use coping skills and go to bed. Denies any SI including passive SI today. Denies HI. Reports that appetite is okay. Concentration is better.   Denies AH, VH, paranoia. Thoughts are logical, linear, coherent.  Discussed again importance of abstinence from drugs.  No eps today. Discussed starting LAI and pt agreeable.  Discussed dc planning for tomorrow, after monitoring for s/e to LAI.     Principal Problem: Schizophrenia, unspecified (HCC) Diagnosis: Principal Problem:   Schizophrenia, unspecified (HCC) Active Problems:   Amphetamine use disorder, severe (HCC)  Total Time spent with patient: 15 minutes  Past Psychiatric History:  Previous diagnosis of schizoaffective disorder versus schizophrenia.  History of narcotic overdose, requiring medical admission for aspiration pneumonia after vomiting while he was obtunded. Patient reports a history of about 4 psychiatric hospitalizations in the past, his most recent admission to this hospital was in July 2023.  He also reports that he was admitted to a psychiatric hospital in [place]  around February 2024 because he was stalking his child's mother, and the police were called and took him to the hospital.  He reports not taking medication for very long after discharge from the hospital.  Patient reports 1 suicide attempt in the past by overdose in July 2023. Past  psychiatric medications: Zyprexa  and Invega .  Lexapro .   Past Medical History:  Past Medical History:  Diagnosis Date   Schizophrenia (HCC)    Substance abuse (HCC)     Past Surgical History:  Procedure Laterality Date   NO PAST SURGERIES     Family History: History reviewed. No pertinent family history. Family Psychiatric  History: See H&P Social History:  Social History   Substance and Sexual Activity  Alcohol Use No     Social History   Substance and Sexual Activity  Drug Use Yes   Types: Marijuana, Oxycodone    Social History   Socioeconomic History   Marital status: Single    Spouse name: Not on file   Number of children: Not on file   Years of education: Not on file   Highest education level: Not on file  Occupational History   Not on file  Tobacco Use   Smoking status: Never   Smokeless tobacco: Never  Vaping Use   Vaping status: Never Used  Substance and Sexual Activity   Alcohol use: No   Drug use: Yes    Types: Marijuana, Oxycodone   Sexual activity: Not Currently  Other Topics Concern   Not on file  Social History Narrative   Not on file   Social Drivers of Health   Financial Resource Strain: Not on file  Food Insecurity: No Food Insecurity (07/06/2023)   Hunger Vital Sign    Worried About Running Out of Food in the Last Year: Never true    Ran Out of Food in the Last Year: Never true  Transportation Needs: No Transportation Needs (07/06/2023)   PRAPARE - Transportation  Lack of Transportation (Medical): No    Lack of Transportation (Non-Medical): No  Physical Activity: Not on file  Stress: Not on file  Social Connections: Not on file   Additional Social History:                          Current Medications: Current Facility-Administered Medications  Medication Dose Route Frequency Provider Last Rate Last Admin   acetaminophen  (TYLENOL ) tablet 650 mg  650 mg Oral Q6H PRN Carrion-Carrero, Margely, MD       alum & mag  hydroxide-simeth (MAALOX/MYLANTA) 200-200-20 MG/5ML suspension 30 mL  30 mL Oral Q4H PRN Carrion-Carrero, Margely, MD       diphenhydrAMINE  (BENADRYL ) capsule 25 mg  25 mg Oral Q6H PRN Carrion-Carrero, Margely, MD       haloperidol  (HALDOL ) tablet 5 mg  5 mg Oral TID PRN Carrion-Carrero, Margely, MD       And   diphenhydrAMINE  (BENADRYL ) capsule 50 mg  50 mg Oral TID PRN Carrion-Carrero, Margely, MD       haloperidol  lactate (HALDOL ) injection 5 mg  5 mg Intramuscular TID PRN Carrion-Carrero, Margely, MD       And   diphenhydrAMINE  (BENADRYL ) injection 50 mg  50 mg Intramuscular TID PRN Carrion-Carrero, Margely, MD       And   LORazepam  (ATIVAN ) injection 2 mg  2 mg Intramuscular TID PRN Carrion-Carrero, Margely, MD       haloperidol  lactate (HALDOL ) injection 10 mg  10 mg Intramuscular TID PRN Carrion-Carrero, Margely, MD       And   diphenhydrAMINE  (BENADRYL ) injection 50 mg  50 mg Intramuscular TID PRN Carrion-Carrero, Margely, MD       And   LORazepam  (ATIVAN ) injection 2 mg  2 mg Intramuscular TID PRN Carrion-Carrero, Marlo, MD       haloperidol  (HALDOL ) tablet 5 mg  5 mg Oral QHS Terrance Lanahan, MD   5 mg at 07/10/23 2103   haloperidol  decanoate (HALDOL  DECANOATE) 100 MG/ML injection 50 mg  50 mg Intramuscular Q30 days Vinton Layson, Rankin, MD       hydrOXYzine  (ATARAX ) tablet 25 mg  25 mg Oral TID PRN Johny Rankin, MD   25 mg at 07/07/23 2117   magnesium  hydroxide (MILK OF MAGNESIA) suspension 30 mL  30 mL Oral Daily PRN Carrion-Carrero, Marlo, MD       neomycin -bacitracin -polymyxin (NEOSPORIN) ointment   Topical BID Falynn Ailey, MD       nicotine  (NICODERM CQ  - dosed in mg/24 hours) patch 14 mg  14 mg Transdermal Daily Carly Applegate, MD   14 mg at 07/11/23 0907   nicotine  polacrilex (NICORETTE ) gum 2 mg  2 mg Oral PRN Bela Bonaparte, Rankin, MD        Lab Results: No results found for this or any previous visit (from the past 48 hours).  Blood Alcohol level:   Lab Results  Component Value Date   ETH <10 07/06/2023   ETH <10 01/24/2022    Metabolic Disorder Labs: Lab Results  Component Value Date   HGBA1C 5.1 07/07/2023   MPG 100 07/07/2023   MPG 79.58 08/07/2021   No results found for: PROLACTIN Lab Results  Component Value Date   CHOL 117 07/07/2023   TRIG 22 07/07/2023   HDL 56 07/07/2023   CHOLHDL 2.1 07/07/2023   VLDL 4 07/07/2023   LDLCALC 57 07/07/2023   LDLCALC 78 08/07/2021    Physical Findings: AIMS:  , ,  ,  ,  CIWA:    COWS:     Aims score zero on my exam. No eps on my exam.   Musculoskeletal: Strength & Muscle Tone: within normal limits Gait & Station: normal Patient leans: N/A  Psychiatric Specialty Exam:  Presentation  General Appearance:  Casual  Eye Contact: Fair  Speech: Normal Rate; Clear and Coherent  Speech Volume: Normal  Handedness: Right   Mood and Affect  Mood: Euthymic  Affect: Congruent; Full Range   Thought Process  Thought Processes: Linear  Descriptions of Associations:Intact  Orientation:Full (Time, Place and Person)  Thought Content:Logical  History of Schizophrenia/Schizoaffective disorder:Yes  Duration of Psychotic Symptoms:Greater than six months  Hallucinations:Hallucinations: None  Ideas of Reference:None  Suicidal Thoughts:Suicidal Thoughts: No  Homicidal Thoughts:Homicidal Thoughts: No   Sensorium  Memory: Immediate Fair; Recent Fair; Remote Fair  Judgment: Fair  Insight: Fair   Art Therapist  Concentration: Poor  Attention Span: Poor  Recall: [employer] of Knowledge: Fair  Language: Good   Psychomotor Activity  Psychomotor Activity: Psychomotor Activity: Normal   Assets  Assets: Communication Skills; Desire for Improvement; Financial Resources/Insurance; Housing; Physical Health; Resilience; Social Support   Sleep  Sleep: Sleep: Fair    Physical Exam: Physical Exam Vitals reviewed.   Constitutional:      General: He is not in acute distress.    Appearance: He is normal weight. He is not toxic-appearing.  Pulmonary:     Effort: Pulmonary effort is normal. No respiratory distress.  Neurological:     Mental Status: He is alert.     Motor: No weakness.     Gait: Gait normal.  Psychiatric:        Mood and Affect: Mood normal.        Behavior: Behavior normal.        Thought Content: Thought content normal.        Judgment: Judgment normal.    Review of Systems  Constitutional:  Negative for chills and fever.  Cardiovascular:  Negative for chest pain and palpitations.  Neurological:  Negative for dizziness, tingling, tremors and headaches.  Psychiatric/Behavioral:  Negative for depression, hallucinations, memory loss, substance abuse and suicidal ideas. The patient is not nervous/anxious and does not have insomnia.   All other systems reviewed and are negative.  Blood pressure 115/75, pulse 77, temperature 97.7 F (36.5 C), temperature source Oral, resp. rate 17, height 5' 10 (1.778 m), weight 62.1 kg, SpO2 99%. Body mass index is 19.66 kg/m.   Treatment Plan Summary: Daily contact with patient to assess and evaluate symptoms and progress in treatment and Medication management  Daily contact with patient to assess and evaluate symptoms and progress in treatment and Medication management   ASSESSMENT:   Diagnoses / Active Problems: Schizophrenia, type unspecified versus schizoaffective disorder (by history)    History of methamphetamine use disorder  History of cannabis use disorder Tobacco use    PLAN: Safety and Monitoring:             --  Voluntary admission to inpatient psychiatric unit for safety, stabilization and treatment             -- Daily contact with patient to assess and evaluate symptoms and progress in treatment             -- Patient's case to be discussed in multi-disciplinary team meeting             -- Observation Level : q15 minute  checks             --  Vital signs:  q12 hours             -- Precautions: suicide, elopement, and assault   2. Psychiatric Diagnoses and Treatment:               -Previously discontinued Invega  due to high cost of LAI (over $1k per month for pharmacy). -Continue haldol  5 mg at bedtime for psychosis. Plan for Awanda which is $8 per month per pharmacy.  -Start Haldol  decanoate 50 mg IM q30d LAI - for schizophrenia.  -D/c propranolol  due to daytime sedation.  Prescribed for tachycardia and anxiety.  -Previously d/c trazodone  50 mg po Q bedtime prn for insomnia.  -Continue Hydroxyzine  25 mg po tid prn for anxiety.  Do not give at bedtime, due to daytime sedation.   --  The risks/benefits/side-effects/alternatives to this medication were discussed in detail with the patient and time was given for questions. The patient consents to medication trial.                -- Metabolic profile and EKG monitoring obtained while on an atypical antipsychotic (BMI: Lipid Panel: HbgA1c: QTc:)              -- Encouraged patient to participate in unit milieu and in scheduled group therapies                   3. Medical Issues Being Addressed:              Tobacco Use Disorder             -- Nicotine  patch 21mg /24 hours ordered             -- Smoking cessation encouraged   4. Discharge Planning:              -- Social work and case management to assist with discharge planning and identification of hospital follow-up needs prior to discharge             -- Estimated LOS: plan dc tomorrow 1-6              -- Discharge Concerns: Need to establish a safety plan; Medication compliance and effectiveness             -- Discharge Goals: Return home with outpatient referrals for mental health follow-up including medication management/psychotherapy   Rankin Montes, MD 07/11/2023, 9:13 AM  Total Time Spent in Direct Patient Care:  I personally spent 35 minutes on the unit in direct patient care. The direct patient  care time included face-to-face time with the patient, reviewing the patient's chart, communicating with other professionals, and coordinating care. Greater than 50% of this time was spent in counseling or coordinating care with the patient regarding goals of hospitalization, psycho-education, and discharge planning needs.   Rankin Montes, MD Psychiatrist

## 2023-07-11 NOTE — Plan of Care (Signed)

## 2023-07-11 NOTE — Group Note (Signed)
 Date:  07/12/2023 Time:  4:27 AM  Group Topic/Focus:  Wrap-Up Group:   The focus of this group is to help patients review their daily goal of treatment and discuss progress on daily workbooks.    Participation Level:  Active  Participation Quality:  Appropriate and Sharing  Affect:  Appropriate  Cognitive:  Appropriate  Insight: Appropriate  Engagement in Group:  Engaged  Modes of Intervention:  Activity and Socialization  Additional Comments:  Patient stated that he had a great day. Patient shared the highlight of his day was having fish for dinner which he stated he ws looking forward to. Patient rated his day a 10/10. Patient stated that his goal for today was to learn something but did not elaborate. Patient stated that his goal for tomorrow is to prepare for discharge and stated that he doesn't want to blame people and take accountability. Patient participated in activity after sharing.   Eward Mace 07/12/2023, 4:27 AM

## 2023-07-11 NOTE — Progress Notes (Signed)
 D: Patient is alert, oriented, pleasant, and cooperative. Denies SI, HI, AVH, and verbally contracts for safety. Patient reports he slept good last night without sleeping medication. Patient reports his appetite as good, energy level as normal, and concentration as good. Patient rates his depression 0/10, hopelessness 1/10, and anxiety 0/10. Patient denies physical symptoms/pain.   A: Scheduled medications administered per MD order. LAI administered. Support provided. Patient educated on safety on the unit and medications. Routine safety checks every 15 minutes. Patient stated understanding to tell nurse about any new physical symptoms. Patient understands to tell staff of any needs.     R: No adverse drug reactions noted. Patient remains safe at this time and will continue to monitor.    07/11/23 0900  Psych Admission Type (Psych Patients Only)  Admission Status Voluntary  Psychosocial Assessment  Patient Complaints None  Eye Contact Fair  Facial Expression Animated  Affect Appropriate to circumstance  Speech Logical/coherent  Interaction Assertive  Motor Activity Other (Comment) (WNL)  Appearance/Hygiene Unremarkable  Behavior Characteristics Cooperative  Mood Pleasant  Thought Process  Coherency WDL  Content WDL  Delusions None reported or observed  Perception WDL  Hallucination None reported or observed  Judgment Impaired  Confusion None  Danger to Self  Current suicidal ideation? Denies  Self-Injurious Behavior No self-injurious ideation or behavior indicators observed or expressed   Agreement Not to Harm Self Yes  Description of Agreement verbal  Danger to Others  Danger to Others None reported or observed

## 2023-07-11 NOTE — [facility] Group Notes (Signed)
 [facility] Group Notes:  (Nursing)  Date:  07/11/2023  Time:  1400  Type of Therapy:  Psychoeducational Skills  Participation Level:  Active  Participation Quality:  Appropriate and Attentive  Affect:  Appropriate  Cognitive:  Alert and Appropriate  Insight:  Good  Engagement in Group:  Engaged and Supportive  Modes of Intervention:  Discussion, Exploration, Rapport Building, Socialization, and Support  Summary of Progress/Problems:  Jerry Garrison 07/11/2023, 4:01 PM

## 2023-07-12 DIAGNOSIS — F203 Undifferentiated schizophrenia: Secondary | ICD-10-CM | POA: Diagnosis not present

## 2023-07-12 MED ORDER — HALOPERIDOL 5 MG PO TABS: 5.0000 mg | ORAL_TABLET | Freq: Every day | ORAL | 0 refills | Status: DC

## 2023-07-12 MED ORDER — NICOTINE 14 MG/24HR TD PT24: 14.0000 mg | MEDICATED_PATCH | Freq: Every day | TRANSDERMAL | 0 refills | Status: DC

## 2023-07-12 MED ORDER — NICOTINE POLACRILEX 2 MG MT GUM: 2.0000 mg | CHEWING_GUM | OROMUCOSAL | 0 refills | Status: DC | PRN

## 2023-07-12 MED ORDER — HALOPERIDOL DECANOATE 100 MG/ML IM SOLN: 50.0000 mg | INTRAMUSCULAR | 0 refills | Status: DC

## 2023-07-12 NOTE — Progress Notes (Signed)
   07/12/23 0529  15 Minute Checks  Location Bedroom  Visual Appearance Calm  Behavior Composed  Sleep (Behavioral Health Patients Only)  Calculate sleep? (Click Yes once per 24 hr at 0600 safety check) Yes  Documented sleep last 24 hours 8

## 2023-07-12 NOTE — Discharge Instructions (Signed)
-  Follow-up with your outpatient psychiatric provider -instructions on appointment date, time, and address (location) are provided to you in discharge paperwork.  -Take your psychiatric medications as prescribed at discharge - instructions are provided to you in the discharge paperwork -As we discussed, we will provide printed prescriptions at discharge - take these to the pharmacy of your choosing to have your medications filled.  -You received the LAI of haldol  on 07-11-23. Your next dose is due in 30 days on 08-10-23. Pick-up this prescription before your next injection is due, and take it with you to your injection appt.  -As you are now on the LAI, you will only continue your oral haldol  for 10 days after discharge from this hospital.  -Follow-up with outpatient primary care doctor and other specialists -for management of preventative medicine and any chronic medical disease.  -Recommend abstinence from alcohol, tobacco, and other illicit drug use at discharge.   -If your psychiatric symptoms recur, worsen, or if you have side effects to your psychiatric medications, call your outpatient psychiatric provider, 911, [phone] or go to the nearest emergency department.  -If suicidal thoughts occur, call your outpatient psychiatric provider, 911, [phone] or go to the nearest emergency department.  Naloxone (Narcan) can help reverse an overdose when given to the victim quickly.  [facility] offers free naloxone kits and instructions/training on its use.  Add naloxone to your first aid kit and you can help save a life.   Pick up your free kit at the following locations:   [place]:  [facility] of [facility], [address] [place] [address] [phone]) [facility] [address] [place] [id number] [phone]) [facility] [address] [place] [address]  [place]: [facility] Division of  [facility] [address] [place] [address] ([phone]) [facility] [address] [place] [address] [phone])

## 2023-07-12 NOTE — Discharge Summary (Signed)
 Physician Discharge Summary Note  Patient:  Jerry Garrison is an 23 y.o., male MRN:  [id number] DOB:  10/27/2000 Patient phone:  [phone] (home)  Patient address:   [address] [place] [address],  Total Time spent with patient: 20 minutes  Date of Admission:  07/07/2023 Date of Discharge: 07-12-2023  Reason for Admission:   Patient is a 23 year old male with a psychiatric history schizophrenia versus schizoaffective disorder and methamphetamine abuse, who was admitted to the psychiatric unit from the [facility] for evaluation and treatment of self-harm in the context of worsening psychosis.    Principal Problem: Schizophrenia, unspecified (HCC) Discharge Diagnoses: Principal Problem:   Schizophrenia, unspecified (HCC) Active Problems:   Amphetamine use disorder, severe (HCC)   Past Psychiatric History:  Previous diagnosis of schizoaffective disorder versus schizophrenia.  History of narcotic overdose, requiring medical admission for aspiration pneumonia after vomiting while he was obtunded. Patient reports a history of about 4 psychiatric hospitalizations in the past, his most recent admission to this hospital was in July 2023.  He also reports that he was admitted to a psychiatric hospital in [place]  around February 2024 because he was stalking his child's mother, and the police were called and took him to the hospital.  He reports not taking medication for very long after discharge from the hospital.  Patient reports 1 suicide attempt in the past by overdose in July 2023. Past psychiatric medications: Zyprexa  and Invega .  Lexapro .   Past Medical History:  Past Medical History:  Diagnosis Date   Schizophrenia (HCC)    Substance abuse (HCC)     Past Surgical History:  Procedure Laterality Date   NO PAST SURGERIES     Family History: History reviewed. No pertinent family history. Family Psychiatric  History: See H&P  Social History:  Social History    Substance and Sexual Activity  Alcohol Use No     Social History   Substance and Sexual Activity  Drug Use Yes   Types: Marijuana, Oxycodone    Social History   Socioeconomic History   Marital status: Single    Spouse name: Not on file   Number of children: Not on file   Years of education: Not on file   Highest education level: Not on file  Occupational History   Not on file  Tobacco Use   Smoking status: Never   Smokeless tobacco: Never  Vaping Use   Vaping status: Never Used  Substance and Sexual Activity   Alcohol use: No   Drug use: Yes    Types: Marijuana, Oxycodone   Sexual activity: Not Currently  Other Topics Concern   Not on file  Social History Narrative   Not on file   Social Drivers of Health   Financial Resource Strain: Not on file  Food Insecurity: No Food Insecurity (07/06/2023)   Hunger Vital Sign    Worried About Running Out of Food in the Last Year: Never true    Ran Out of Food in the Last Year: Never true  Transportation Needs: No Transportation Needs (07/06/2023)   PRAPARE - Administrator, Civil Service (Medical): No    Lack of Transportation (Non-Medical): No  Physical Activity: Not on file  Stress: Not on file  Social Connections: Not on file    Hospital Course:    During the patient's hospitalization, patient had extensive initial psychiatric evaluation, and follow-up psychiatric evaluations every day.  Psychiatric diagnoses provided upon initial assessment:  Schizophrenia, type unspecified  versus schizoaffective disorder (by history)  History of methamphetamine use disorder  History of cannabis use disorder Tobacco use  Patient's psychiatric medications were adjusted on admission:  -Increase Invega  from 3 mg once daily to 6 mg nightly (will give 3 mg dose tonight in addition to the 3 mg dose he received this morning) for schizophrenia.  Goal for LAI.    During the hospitalization, other adjustments were made to  the patient's psychiatric medication regimen:  -Invega  was dc due to high cost of LAI. Switched to haldol  -Haldol  was started at 5 mg every day.  -Haldol  dec LAI 50 mg was given on 07-11-23. Next dose is due on 08-10-23.  -propranolol  was started for anxiety and tachycardia, and was tapered off prior to dc.   Patient's care was discussed during the interdisciplinary team meeting every day during the hospitalization.  The patient reported sedation that lessened. He otherwise denied having other side effects to prescribed psychiatric medication.  Gradually, patient started adjusting to milieu. The patient was evaluated each day by a clinical provider to ascertain response to treatment. Improvement was noted by the patient's report of decreasing symptoms, improved sleep and appetite, affect, medication tolerance, behavior, and participation in unit programming.  Patient was asked each day to complete a self inventory noting mood, mental status, pain, new symptoms, anxiety and concerns.    Symptoms were reported as significantly decreased or resolved completely by discharge.   On day of discharge, the patient reports that their mood is stable. The patient denied having suicidal thoughts for more than 48 hours prior to discharge.  Patient denies having homicidal thoughts.  Patient denies having auditory hallucinations.  Patient denies any visual hallucinations or other symptoms of psychosis. The patient was motivated to continue taking medication with a goal of continued improvement in mental health.   The patient reports their target psychiatric symptoms of psychosis and SI, all responded well to the psychiatric medications, and the patient reports overall benefit other psychiatric hospitalization. Supportive psychotherapy was provided to the patient. The patient also participated in regular group therapy while hospitalized. Coping skills, problem solving as well as relaxation therapies were also part of the  unit programming.  Labs were reviewed with the patient, and abnormal results were discussed with the patient.  The patient is able to verbalize their individual safety plan to this provider.  # It is recommended to the patient to continue psychiatric medications as prescribed, after discharge from the hospital.    # It is recommended to the patient to follow up with your outpatient psychiatric provider and PCP.  # It was discussed with the patient, the impact of alcohol, drugs, tobacco have been there overall psychiatric and medical wellbeing, and total abstinence from substance use was recommended the patient.ed.  # Prescriptions provided or sent directly to preferred pharmacy at discharge. Patient agreeable to plan. Given opportunity to ask questions. Appears to feel comfortable with discharge.    # In the event of worsening symptoms, the patient is instructed to call the crisis hotline, 911 and or go to the nearest ED for appropriate evaluation and treatment of symptoms. To follow-up with primary care provider for other medical issues, concerns and or health care needs  # Patient was discharged home to care of his mother, with a plan to follow up as noted below.   Physical Findings: AIMS:  , ,  ,  ,    CIWA:    COWS:     Aims score zero  on my exam. No eps on my exam.   Musculoskeletal: Strength & Muscle Tone: within normal limits Gait & Station: normal Patient leans: N/A   Psychiatric Specialty Exam:  Presentation  General Appearance:  Appropriate for Environment; Casual; Fairly Groomed  Eye Contact: Good  Speech: Normal Rate; Clear and Coherent  Speech Volume: Normal  Handedness: Right   Mood and Affect  Mood: Euthymic  Affect: Appropriate; Congruent; Full Range   Thought Process  Thought Processes: Linear  Descriptions of Associations:Intact  Orientation:Full (Time, Place and Person)  Thought Content:Logical  History of  Schizophrenia/Schizoaffective disorder:Yes  Duration of Psychotic Symptoms:Greater than six months  Hallucinations:Hallucinations: None  Ideas of Reference:None  Suicidal Thoughts:Suicidal Thoughts: No  Homicidal Thoughts:Homicidal Thoughts: No   Sensorium  Memory: Immediate Good; Recent Good; Remote Good  Judgment: Good  Insight: Good   Executive Functions  Concentration: Good  Attention Span: Good  Recall: Good  Fund of Knowledge: Good  Language: Good   Psychomotor Activity  Psychomotor Activity: Psychomotor Activity: Normal   Assets  Assets: Communication Skills; Desire for Improvement; Financial Resources/Insurance; Housing; Physical Health; Resilience; Social Support   Sleep  Sleep: Sleep: Fair    Physical Exam: Physical Exam Vitals reviewed.  Constitutional:      General: He is not in acute distress.    Appearance: He is normal weight. He is not toxic-appearing.  Pulmonary:     Effort: Pulmonary effort is normal. No respiratory distress.  Neurological:     Mental Status: He is alert.     Motor: No weakness.     Gait: Gait normal.  Psychiatric:        Mood and Affect: Mood normal.        Behavior: Behavior normal.        Thought Content: Thought content normal.        Judgment: Judgment normal.    Review of Systems  Constitutional:  Negative for chills and fever.  Cardiovascular:  Negative for chest pain and palpitations.  Neurological:  Negative for dizziness, tingling, tremors and headaches.  Psychiatric/Behavioral:  Negative for depression, hallucinations, memory loss, substance abuse and suicidal ideas. The patient is not nervous/anxious and does not have insomnia.   All other systems reviewed and are negative.  Blood pressure 123/74, pulse 64, temperature 97.8 F (36.6 C), temperature source Oral, resp. rate 17, height 5' 10 (1.778 m), weight 62.1 kg, SpO2 100%. Body mass index is 19.66 kg/m.   Social History    Tobacco Use  Smoking Status Never  Smokeless Tobacco Never   Tobacco Cessation:  A prescription for an FDA-approved tobacco cessation medication provided at discharge   Blood Alcohol level:  Lab Results  Component Value Date   [facility] <10 07/06/2023   ETH <10 01/24/2022    Metabolic Disorder Labs:  Lab Results  Component Value Date   HGBA1C 5.1 07/07/2023   MPG 100 07/07/2023   MPG 79.58 08/07/2021   No results found for: PROLACTIN Lab Results  Component Value Date   CHOL 117 07/07/2023   TRIG 22 07/07/2023   HDL 56 07/07/2023   CHOLHDL 2.1 07/07/2023   VLDL 4 07/07/2023   LDLCALC 57 07/07/2023   LDLCALC 78 08/07/2021    See Psychiatric Specialty Exam and Suicide Risk Assessment completed by Attending Physician prior to discharge.  Discharge destination:  Home  Is patient on multiple antipsychotic therapies at discharge:  No   Has Patient had three or more failed trials of antipsychotic monotherapy by history:  No  Recommended Plan for Multiple Antipsychotic Therapies: NA  Discharge Instructions     Diet - low sodium heart healthy   Complete by: As directed    Increase activity slowly   Complete by: As directed       Allergies as of 07/12/2023       Reactions   Latex Itching   Grass Pollen(k-o-[id number]-swt Vern) Rash        Medication List     STOP taking these medications    escitalopram  5 MG tablet Commonly known as: LEXAPRO    hydrOXYzine  25 MG tablet Commonly known as: ATARAX    paliperidone  3 MG 24 hr tablet Commonly known as: INVEGA    traZODone  100 MG tablet Commonly known as: DESYREL        TAKE these medications      Indication  haloperidol  5 MG tablet Commonly known as: HALDOL  Take 1 tablet (5 mg total) by mouth at bedtime for 10 days.  Indication: Psychosis   haloperidol  decanoate 100 MG/ML injection Commonly known as: HALDOL  DECANOATE Inject 0.5 mLs (50 mg total) into the muscle every 30 (thirty) days for 1 dose. Next dose  is due to be administerred on 08-10-23. Start taking on: August 10, 2023  Indication: Schizophrenia   nicotine  14 mg/24hr patch Commonly known as: NICODERM CQ  - dosed in mg/24 hours Place 1 patch (14 mg total) onto the skin daily. Start taking on: July 13, 2023 What changed:  additional instructions Another medication with the same name was removed. Continue taking this medication, and follow the directions you see here.  Indication: Nicotine  Addiction   nicotine  polacrilex 2 MG gum Commonly known as: NICORETTE  Take 1 each (2 mg total) by mouth as needed for smoking cessation.  Indication: Nicotine  Addiction        Follow-up Information     [facility]. Schedule an appointment as soon as possible for a visit.   Why: Please call this provider personally to schedule an appointment for therapy services. Contact information: [address] [place], [place] [address] [phone]         [facility] Go on 07/26/2023.   Why: You have an appointment for medication management services on 07/26/23 at 6:00 pm.  The appointment will be Virtual. Contact information: [address] [place] [address] [phone]                 Follow-up recommendations:    Activity: as tolerated  Diet: heart healthy  Other: -Follow-up with your outpatient psychiatric provider -instructions on appointment date, time, and address (location) are provided to you in discharge paperwork.  -Take your psychiatric medications as prescribed at discharge - instructions are provided to you in the discharge paperwork  -As we discussed, we will provide printed prescriptions at discharge - take these to the pharmacy of your choosing to have your medications filled.  -You received the LAI of haldol  on 07-11-23. Your next dose is due in 30 days on 08-10-23. Pick-up this prescription before your next injection is due, and take it  with you to your injection appt.  -As you are now on the LAI, you will only continue your oral haldol  for 10 days after discharge from this hospital.  -Follow-up with outpatient primary care doctor and other specialists -for management of preventative medicine and chronic medical disease  -If you are prescribed an atypical antipsychotic medication, we recommend that your outpatient psychiatrist follow routine screening for  side effects within 3 months of discharge, including monitoring: AIMS scale, height, weight, blood pressure, fasting lipid panel, HbA1c, and fasting blood sugar.   -Recommend total abstinence from alcohol, tobacco, and other illicit drug use at discharge.   -If your psychiatric symptoms recur, worsen, or if you have side effects to your psychiatric medications, call your outpatient psychiatric provider, 911, [phone] or go to the nearest emergency department.  -If suicidal thoughts occur, immediately call your outpatient psychiatric provider, 911, [phone] or go to the nearest emergency department.   Signed: Rankin Montes, MD 07/12/2023, 9:40 AM  Total Time Spent in Direct Patient Care:  I personally spent 35 minutes on the unit in direct patient care. The direct patient care time included face-to-face time with the patient, reviewing the patient's chart, communicating with other professionals, and coordinating care. Greater than 50% of this time was spent in counseling or coordinating care with the patient regarding goals of hospitalization, psycho-education, and discharge planning needs.   Rankin Montes, MD Psychiatrist

## 2023-07-12 NOTE — Progress Notes (Signed)
  [facility] Adult Case Management Discharge Plan :  Will you be returning to the same living situation after discharge:  Yes,    At discharge, do you have transportation home?: Yes,  mother Jashun Puertas, [phone] will pick pt up at 11am  Do you have the ability to pay for your medications: Yes,  BLUE CROSS BLUE SHIELD / [employer] NON-PARTICIPATING [employer]  Release of information consent forms completed and in the chart;  Patient's signature needed at discharge.  Patient to Follow up at:  Follow-up Information     [facility]. Schedule an appointment as soon as possible for a visit.   Why: Please call this provider personally to schedule an appointment for therapy services. Contact information: [address] [place], [place] [address] [phone]         [facility] Go on 07/26/2023.   Why: You have an appointment for medication management services on 07/26/23 at 6:00 pm.  The appointment will be Virtual. Contact information: [address] [place] [address] [phone]                 Next level of care provider has access to [facility] Link:no  Safety Planning and Suicide Prevention discussed: Yes,  w/mother Suzen Rattler, [phone]  Has patient been referred to the Quitline?: Patient does not use tobacco/nicotine  products  Patient has been referred for addiction treatment: Patient refused referral for treatment.  Golda Louder, LCSWA 07/12/2023, 9:11 AM

## 2023-07-12 NOTE — [facility] Suicide Risk Assessment (Signed)
 [facility] Discharge Suicide Risk Assessment   Principal Problem: Schizophrenia, unspecified (HCC) Discharge Diagnoses: Principal Problem:   Schizophrenia, unspecified (HCC) Active Problems:   Amphetamine use disorder, severe (HCC)   Total Time spent with patient: 20 minutes  Patient is a 23 year old male with a psychiatric history schizophrenia versus schizoaffective disorder and methamphetamine abuse, who was admitted to the psychiatric unit from the [facility] for evaluation and treatment of self-harm in the context of worsening psychosis.   During the patient's hospitalization, patient had extensive initial psychiatric evaluation, and follow-up psychiatric evaluations every day.   Psychiatric diagnoses provided upon initial assessment:  Schizophrenia, type unspecified versus schizoaffective disorder (by history)  History of methamphetamine use disorder  History of cannabis use disorder Tobacco use   Patient's psychiatric medications were adjusted on admission:  -Increase Invega  from 3 mg once daily to 6 mg nightly (will give 3 mg dose tonight in addition to the 3 mg dose he received this morning) for schizophrenia.  Goal for LAI.     During the hospitalization, other adjustments were made to the patient's psychiatric medication regimen:  -Invega  was dc due to high cost of LAI. Switched to haldol  -Haldol  was started at 5 mg every day.  -Haldol  dec LAI 50 mg was given on 07-11-23. Next dose is due on 08-10-23.  -propranolol  was started for anxiety and tachycardia, and was tapered off prior to dc.    Patient's care was discussed during the interdisciplinary team meeting every day during the hospitalization.   The patient reported sedation that lessened. He otherwise denied having other side effects to prescribed psychiatric medication.   Gradually, patient started adjusting to milieu. The patient was evaluated each day by a clinical provider to ascertain response to treatment. Improvement was  noted by the patient's report of decreasing symptoms, improved sleep and appetite, affect, medication tolerance, behavior, and participation in unit programming.  Patient was asked each day to complete a self inventory noting mood, mental status, pain, new symptoms, anxiety and concerns.     Symptoms were reported as significantly decreased or resolved completely by discharge.    On day of discharge, the patient reports that their mood is stable. The patient denied having suicidal thoughts for more than 48 hours prior to discharge.  Patient denies having homicidal thoughts.  Patient denies having auditory hallucinations.  Patient denies any visual hallucinations or other symptoms of psychosis. The patient was motivated to continue taking medication with a goal of continued improvement in mental health.    The patient reports their target psychiatric symptoms of psychosis and SI, all responded well to the psychiatric medications, and the patient reports overall benefit other psychiatric hospitalization. Supportive psychotherapy was provided to the patient. The patient also participated in regular group therapy while hospitalized. Coping skills, problem solving as well as relaxation therapies were also part of the unit programming.   Labs were reviewed with the patient, and abnormal results were discussed with the patient.   The patient is able to verbalize their individual safety plan to this provider.   # It is recommended to the patient to continue psychiatric medications as prescribed, after discharge from the hospital.     # It is recommended to the patient to follow up with your outpatient psychiatric provider and PCP.   # It was discussed with the patient, the impact of alcohol, drugs, tobacco have been there overall psychiatric and medical wellbeing, and total abstinence from substance use was recommended the patient.ed.   # Prescriptions  provided or sent directly to preferred pharmacy at  discharge. Patient agreeable to plan. Given opportunity to ask questions. Appears to feel comfortable with discharge.    # In the event of worsening symptoms, the patient is instructed to call the crisis hotline, 911 and or go to the nearest ED for appropriate evaluation and treatment of symptoms. To follow-up with primary care provider for other medical issues, concerns and or health care needs   # Patient was discharged home to care of his mother, with a plan to follow up as noted below.     Psychiatric Specialty Exam  Presentation  General Appearance:  Appropriate for Environment; Casual; Fairly Groomed  Eye Contact: Good  Speech: Normal Rate; Clear and Coherent  Speech Volume: Normal  Handedness: Right   Mood and Affect  Mood: Euthymic  Duration of Depression Symptoms: Greater than two weeks  Affect: Appropriate; Congruent; Full Range   Thought Process  Thought Processes: Linear  Descriptions of Associations:Intact  Orientation:Full (Time, Place and Person)  Thought Content:Logical  History of Schizophrenia/Schizoaffective disorder:Yes  Duration of Psychotic Symptoms:Greater than six months  Hallucinations:Hallucinations: None  Ideas of Reference:None  Suicidal Thoughts:Suicidal Thoughts: No  Homicidal Thoughts:Homicidal Thoughts: No   Sensorium  Memory: Immediate Good; Recent Good; Remote Good  Judgment: Good  Insight: Good   Executive Functions  Concentration: Good  Attention Span: Good  Recall: Good  Fund of Knowledge: Good  Language: Good   Psychomotor Activity  Psychomotor Activity: Psychomotor Activity: Normal   Assets  Assets: Communication Skills; Desire for Improvement; Financial Resources/Insurance; Housing; Physical Health; Resilience; Social Support   Sleep  Sleep: Sleep: Fair   Physical Exam: Physical Exam Vitals reviewed.  Constitutional:      General: He is not in acute distress.     Appearance: He is normal weight. He is not toxic-appearing.  Pulmonary:     Effort: Pulmonary effort is normal. No respiratory distress.  Neurological:     Mental Status: He is alert.     Motor: No weakness.     Gait: Gait normal.  Psychiatric:        Mood and Affect: Mood normal.        Behavior: Behavior normal.        Thought Content: Thought content normal.        Judgment: Judgment normal.    Review of Systems  Constitutional:  Negative for chills and fever.  Cardiovascular:  Negative for chest pain and palpitations.  Neurological:  Negative for dizziness, tingling, tremors and headaches.  Psychiatric/Behavioral:  Negative for depression, hallucinations, memory loss, substance abuse and suicidal ideas. The patient is not nervous/anxious and does not have insomnia.   All other systems reviewed and are negative.  Blood pressure 123/74, pulse 64, temperature 97.8 F (36.6 C), temperature source Oral, resp. rate 17, height 5' 10 (1.778 m), weight 62.1 kg, SpO2 100%. Body mass index is 19.66 kg/m.  Mental Status Per Nursing Assessment::   On Admission:  Suicidal ideation indicated by patient  Demographic factors:  Male Loss Factors:  NA Historical Factors:  Prior suicide attempts Risk Reduction Factors:  NA  Continued Clinical Symptoms:  Mood is stable. Anxiety at a manageable level. Denying any SI including passive SI.    Cognitive Features That Contribute To Risk:  None    Suicide Risk:  Mild:  There are no identifiable suicide plans, no associated intent, mild dysphoria and related symptoms, good self-control (both objective and subjective assessment), few other risk factors, and  identifiable protective factors, including available and accessible social support.    Follow-up Information     Center, [facility]. Schedule an appointment as soon as possible for a visit.   Why: Please call this provider personally to schedule an appointment for  therapy services. Contact information: [address] [place], [place] [address] [phone]         [facility] Go on 07/26/2023.   Why: You have an appointment for medication management services on 07/26/23 at 6:00 pm.  The appointment will be Virtual. Contact information: [address] [place] [address] [phone]                 Plan Of Care/Follow-up recommendations:   -Follow-up with your outpatient psychiatric provider -instructions on appointment date, time, and address (location) are provided to you in discharge paperwork.   -Take your psychiatric medications as prescribed at discharge - instructions are provided to you in the discharge paperwork   -As we discussed, we will provide printed prescriptions at discharge - take these to the pharmacy of your choosing to have your medications filled.  -You received the LAI of haldol  on 07-11-23. Your next dose is due in 30 days on 08-10-23. Pick-up this prescription before your next injection is due, and take it with you to your injection appt.  -As you are now on the LAI, you will only continue your oral haldol  for 10 days after discharge from this hospital.   -Follow-up with outpatient primary care doctor and other specialists -for management of preventative medicine and chronic medical disease   -If you are prescribed an atypical antipsychotic medication, we recommend that your outpatient psychiatrist follow routine screening for side effects within 3 months of discharge, including monitoring: AIMS scale, height, weight, blood pressure, fasting lipid panel, HbA1c, and fasting blood sugar.    -Recommend total abstinence from alcohol, tobacco, and other illicit drug use at discharge.    -If your psychiatric symptoms recur, worsen, or if you have side effects to your psychiatric medications, call your outpatient psychiatric provider, 911, [phone] or go to the nearest emergency  department.   -If suicidal thoughts occur, immediately call your outpatient psychiatric provider, 911, [phone] or go to the nearest emergency department.    Rankin Montes, MD 07/12/2023, 9:44 AM

## 2023-07-12 NOTE — Progress Notes (Signed)
 Patient verbalizes readiness for discharge. All patient belongings returned to patient. Discharge instructions read and discussed with patient (appointments, medications, resources). Patient expressed gratitude for care provided. Patient discharged to lobby at 1140 where ride was waiting.

## 2023-07-12 NOTE — Progress Notes (Signed)
 Pt shared feeling a little anxious about being discharged tomorrow. He does report planning to continue taking his medications as prescribed upon discharge. He said that he has taken some relationship advice from his peers and plans on being in his son's life now. He denies experiencing any side effects from his medications. He has been calm and cooperative on the unit. Pt denies SI/HI and AVH. Active listening, reassurance, and support provided. Every 15 minutes safety checks continue. Pt's safety has been maintained.   07/11/23 2100  Psych Admission Type (Psych Patients Only)  Admission Status Voluntary  Psychosocial Assessment  Patient Complaints None  Eye Contact Fair  Facial Expression Animated;Anxious  Affect Appropriate to circumstance;Anxious  Speech Logical/coherent  Interaction Assertive  Motor Activity Other (Comment) (WDL)  Appearance/Hygiene Unremarkable  Behavior Characteristics Cooperative;Appropriate to situation;Anxious  Mood Anxious;Pleasant  Thought Process  Coherency WDL  Content WDL  Delusions None reported or observed  Perception WDL  Hallucination None reported or observed  Judgment WDL  Confusion None  Danger to Self  Current suicidal ideation? Denies  Self-Injurious Behavior No self-injurious ideation or behavior indicators observed or expressed   Agreement Not to Harm Self Yes  Description of Agreement verbally contracts for safety  Danger to Others  Danger to Others None reported or observed

## 2023-09-01 ENCOUNTER — Other Ambulatory Visit: Payer: Self-pay

## 2023-09-01 ENCOUNTER — Emergency Department (HOSPITAL_COMMUNITY)
Admission: EM | Admit: 2023-09-01 | Discharge: 2023-09-02 | Disposition: A | Discharge status: Home / Self Care | Payer: BLUE CROSS/BLUE SHIELD | Attending: Emergency Medicine | Admitting: Emergency Medicine

## 2023-09-01 ENCOUNTER — Encounter (HOSPITAL_COMMUNITY): Payer: Self-pay

## 2023-09-01 DIAGNOSIS — F1510 Other stimulant abuse, uncomplicated: Secondary | ICD-10-CM | POA: Insufficient documentation

## 2023-09-01 DIAGNOSIS — Z91040 Latex allergy status: Secondary | ICD-10-CM | POA: Diagnosis not present

## 2023-09-01 DIAGNOSIS — F29 Unspecified psychosis not due to a substance or known physiological condition: Secondary | ICD-10-CM | POA: Diagnosis not present

## 2023-09-01 DIAGNOSIS — R4689 Other symptoms and signs involving appearance and behavior: Secondary | ICD-10-CM | POA: Diagnosis present

## 2023-09-01 LAB — CBC WITH DIFFERENTIAL/PLATELET
Abs Immature Granulocytes: 0.02 10*3/uL (ref 0.00–0.07)
Basophils Absolute: 0 10*3/uL (ref 0.0–0.1)
Basophils Relative: 1 %
Eosinophils Absolute: 0.1 10*3/uL (ref 0.0–0.5)
Eosinophils Relative: 1 %
HCT: 39.3 % (ref 39.0–52.0)
Hemoglobin: 13 g/dL (ref 13.0–17.0)
Immature Granulocytes: 0 %
Lymphocytes Relative: 14 %
Lymphs Abs: 0.8 10*3/uL (ref 0.7–4.0)
MCH: 29.2 pg (ref 26.0–34.0)
MCHC: 33.1 g/dL (ref 30.0–36.0)
MCV: 88.3 fL (ref 80.0–100.0)
Monocytes Absolute: 1.1 10*3/uL — ABNORMAL HIGH (ref 0.1–1.0)
Monocytes Relative: 18 %
Neutro Abs: 4 10*3/uL (ref 1.7–7.7)
Neutrophils Relative %: 66 %
Platelets: 207 10*3/uL (ref 150–400)
RBC: 4.45 MIL/uL (ref 4.22–5.81)
RDW: 12.1 % (ref 11.5–15.5)
WBC: 6 10*3/uL (ref 4.0–10.5)
nRBC: 0 % (ref 0.0–0.2)

## 2023-09-01 LAB — RAPID URINE DRUG SCREEN, HOSP PERFORMED
Amphetamines: NOT DETECTED
Barbiturates: NOT DETECTED
Benzodiazepines: NOT DETECTED
Cocaine: NOT DETECTED
Opiates: NOT DETECTED
Tetrahydrocannabinol: POSITIVE — AB

## 2023-09-01 LAB — COMPREHENSIVE METABOLIC PANEL
ALT: 14 U/L (ref 0–44)
AST: 19 U/L (ref 15–41)
Albumin: 4.3 g/dL (ref 3.5–5.0)
Alkaline Phosphatase: 49 U/L (ref 38–126)
Anion gap: 11 (ref 5–15)
BUN: 16 mg/dL (ref 6–20)
CO2: 22 mmol/L (ref 22–32)
Calcium: 8.8 mg/dL — ABNORMAL LOW (ref 8.9–10.3)
Chloride: 101 mmol/L (ref 98–111)
Creatinine, Ser: 0.92 mg/dL (ref 0.61–1.24)
GFR, Estimated: >60 mL/min (ref >60)
Glucose, Bld: 102 mg/dL — ABNORMAL HIGH (ref 70–99)
Potassium: 3.5 mmol/L (ref 3.5–5.1)
Sodium: 134 mmol/L — ABNORMAL LOW (ref 135–145)
Total Bilirubin: 2.2 mg/dL — ABNORMAL HIGH (ref 0.0–1.2)
Total Protein: 7.2 g/dL (ref 6.5–8.1)

## 2023-09-01 LAB — ETHANOL: Alcohol, Ethyl (B): <10 mg/dL (ref <10)

## 2023-09-01 LAB — SALICYLATE LEVEL: Salicylate Lvl: <7 mg/dL — ABNORMAL LOW (ref 7.0–30.0)

## 2023-09-01 LAB — ACETAMINOPHEN LEVEL: Acetaminophen (Tylenol), Serum: <10 ug/mL — ABNORMAL LOW (ref 10–30)

## 2023-09-01 NOTE — ED Provider Notes (Addendum)
 [place] EMERGENCY DEPARTMENT AT [facility] Provider Note   CSN: [id number] Arrival date & time: 09/01/23  2053     History  Chief Complaint  Patient presents with   Paranoid    Jerry Garrison is a 23 y.o. male.  HPI    23 year old male with history of schizoaffective disorder, methamphetamine use comes in with chief complaint of feeling hopeless and paranoid.  Patient accompanied by mother.  Patient states that his mother asked if he wanted to come and get help, and he said yes.  Patient admits to using meth and marijuana.  He uses marijuana every day and meth regularly.  He feels hopeless.  He states that he sometimes feels like he is part of God.  He does not have any SI, HI at this time.  Per mom, patient told him recently that he would like to get better and stop using drugs and wanted to go to rehab.  Patient is not taking his medications.  Per mother, patient hardly sleeps.  Home Medications Prior to Admission medications   Medication Sig Start Date End Date Taking? Authorizing Provider  haloperidol (HALDOL) 5 MG tablet Take 1 tablet (5 mg total) by mouth at bedtime for 10 days. 07/12/23 07/22/23  Massengill, Harrold Donath, MD  haloperidol decanoate (HALDOL DECANOATE) 100 MG/ML injection Inject 0.5 mLs (50 mg total) into the muscle every 30 (thirty) days for 1 dose. Next dose is due to be administerred on 08-10-23. 08/10/23 08/11/23  Massengill, Harrold Donath, MD  nicotine (NICODERM CQ - DOSED IN MG/24 HOURS) 14 mg/24hr patch Place 1 patch (14 mg total) onto the skin daily. 07/13/23   Massengill, Harrold Donath, MD  nicotine polacrilex (NICORETTE) 2 MG gum Take 1 each (2 mg total) by mouth as needed for smoking cessation. 07/12/23   Massengill, Harrold Donath, MD      Allergies    Latex and Grass pollen([id number] vern)    Review of Systems   Review of Systems  All other systems reviewed and are negative.   Physical Exam Updated Vital Signs BP 129/83 (BP Location: Right Arm)   Pulse 97    Temp 99.2 F (37.3 C) (Oral)   Resp 18   Ht 5\' 10"  (1.778 m)   Wt 62.1 kg   SpO2 97%   BMI 19.64 kg/m  Physical Exam Vitals and nursing note reviewed.  Constitutional:      Appearance: He is well-developed.  HENT:     Head: Atraumatic.  Cardiovascular:     Rate and Rhythm: Normal rate.  Pulmonary:     Effort: Pulmonary effort is normal.  Musculoskeletal:     Cervical back: Neck supple.  Skin:    General: Skin is warm.  Neurological:     Mental Status: He is alert and oriented to person, place, and time.  Psychiatric:        Attention and Perception: Attention normal.        Mood and Affect: Mood is depressed.        Speech: Speech is delayed.        Behavior: Behavior is cooperative.        Thought Content: Thought content is paranoid and delusional.     ED Results / Procedures / Treatments   Labs (all labs ordered are listed, but only abnormal results are displayed) Labs Reviewed  CBC WITH DIFFERENTIAL/PLATELET - Abnormal; Notable for the following components:      Result Value   Monocytes Absolute 1.1 (*)    All  other components within normal limits  COMPREHENSIVE METABOLIC PANEL - Abnormal; Notable for the following components:   Sodium 134 (*)    Glucose, Bld 102 (*)    Calcium 8.8 (*)    Total Bilirubin 2.2 (*)    All other components within normal limits  ACETAMINOPHEN LEVEL - Abnormal; Notable for the following components:   Acetaminophen (Tylenol), Serum <10 (*)    All other components within normal limits  SALICYLATE LEVEL - Abnormal; Notable for the following components:   Salicylate Lvl <7.0 (*)    All other components within normal limits  ETHANOL  RAPID URINE DRUG SCREEN, HOSP PERFORMED    EKG None  Date: 09/01/2023  Rate: 73  Rhythm: normal sinus rhythm  QRS Axis: normal  Intervals: normal  ST/T Wave abnormalities: normal  Conduction Disutrbances: none  Narrative Interpretation: unremarkable     Radiology No results  found.  Procedures Procedures    Medications Ordered in ED Medications - No data to display  ED Course/ Medical Decision Making/ A&P                                 Medical Decision Making Amount and/or Complexity of Data Reviewed Labs: ordered.  Patient comes to the ER with cc of delusional thought, paranoid thought, substance use disorder, wanting rehab Patient has pertinent past medical history of schizoaffective disorder, substance use disorder Currently patient is calm, cooperative.  He denies SI, HI but admits to psychosis.  Mother reports that patient has not been sleeping well.  Patient admits to meth amphetamine use.  Pt denies nausea, emesis, fevers, chills, chest pains, shortness of breath, headaches, abdominal pain, uti like symptoms and patient has no active medical complaints. I have reviewed previous encounters for this patient and reviewed their primary medications.  Differential diagnosis considered for this patient includes: Depression Bipolar disorder Schizophrenia Substance abuse Suicidal ideation Acute withdrawal  Appropriate labs have been ordered. Patient is medically cleared for psychiatric evaluation.   I discussed the case with Turkey Chinwendu, to see if patient is appropriate for admission to facility based care or assessment at [facility].  She has accepted to assess the patient for admission at [facility].  Final Clinical Impression(s) / ED Diagnoses Final diagnoses:  Psychosis, unspecified psychosis type (HCC)  Methamphetamine abuse [facility])    Rx / DC Orders ED Discharge Orders     None         Derwood Kaplan, MD 09/01/23 2313    Derwood Kaplan, MD 09/01/23 2346

## 2023-09-01 NOTE — ED Notes (Signed)
Safe transport called 

## 2023-09-01 NOTE — ED Triage Notes (Signed)
 Pt states that he feels hopeless and paranoid.

## 2023-09-01 NOTE — ED Notes (Signed)
Save red tube in main lab 

## 2023-09-01 NOTE — ED Notes (Signed)
 EKG was normal per MD, system did not export over.

## 2023-09-02 ENCOUNTER — Ambulatory Visit (HOSPITAL_COMMUNITY)
Admission: EM | Admit: 2023-09-02 | Discharge: 2023-09-02 | Disposition: A | Discharge status: Home / Self Care | Payer: BLUE CROSS/BLUE SHIELD

## 2023-09-02 ENCOUNTER — Other Ambulatory Visit (HOSPITAL_COMMUNITY)
Admission: EM | Admit: 2023-09-02 | Discharge: 2023-09-10 | Payer: BLUE CROSS/BLUE SHIELD | Attending: Internal Medicine | Admitting: Internal Medicine

## 2023-09-02 ENCOUNTER — Other Ambulatory Visit: Payer: Self-pay

## 2023-09-02 ENCOUNTER — Encounter (HOSPITAL_COMMUNITY): Payer: Self-pay | Admitting: Nurse Practitioner

## 2023-09-02 DIAGNOSIS — F1510 Other stimulant abuse, uncomplicated: Secondary | ICD-10-CM

## 2023-09-02 DIAGNOSIS — F1520 Other stimulant dependence, uncomplicated: Secondary | ICD-10-CM | POA: Diagnosis present

## 2023-09-02 DIAGNOSIS — F1994 Other psychoactive substance use, unspecified with psychoactive substance-induced mood disorder: Secondary | ICD-10-CM

## 2023-09-02 DIAGNOSIS — F1910 Other psychoactive substance abuse, uncomplicated: Secondary | ICD-10-CM

## 2023-09-02 DIAGNOSIS — Z91148 Patient's other noncompliance with medication regimen for other reason: Secondary | ICD-10-CM

## 2023-09-02 DIAGNOSIS — F1914 Other psychoactive substance abuse with psychoactive substance-induced mood disorder: Secondary | ICD-10-CM | POA: Diagnosis not present

## 2023-09-02 MED ORDER — MAGNESIUM HYDROXIDE 400 MG/5ML PO SUSP: 30.0000 mL | Freq: Every day | ORAL | Status: DC | PRN

## 2023-09-02 MED ORDER — METHOCARBAMOL 500 MG PO TABS
500.0000 mg | ORAL_TABLET | Freq: Three times a day (TID) | ORAL | Status: AC | PRN
Start: 2023-09-02 — End: 2023-09-07

## 2023-09-02 MED ORDER — HALOPERIDOL LACTATE 5 MG/ML IJ SOLN: 5.0000 mg | Freq: Three times a day (TID) | INTRAMUSCULAR | Status: DC | PRN

## 2023-09-02 MED ORDER — OXYMETAZOLINE HCL 0.05 % NA SOLN: 1.0000 | Freq: Two times a day (BID) | NASAL | Status: AC | PRN

## 2023-09-02 MED ORDER — LORAZEPAM 2 MG/ML IJ SOLN: 2.0000 mg | Freq: Three times a day (TID) | INTRAMUSCULAR | Status: DC | PRN

## 2023-09-02 MED ORDER — LOPERAMIDE HCL 2 MG PO CAPS
2.0000 mg | ORAL_CAPSULE | ORAL | Status: AC | PRN
Start: 2023-09-02 — End: 2023-09-07

## 2023-09-02 MED ORDER — ACETAMINOPHEN 325 MG PO TABS: 650.0000 mg | ORAL_TABLET | Freq: Four times a day (QID) | ORAL | Status: DC | PRN

## 2023-09-02 MED ORDER — HYDROXYZINE HCL 25 MG PO TABS
25.0000 mg | ORAL_TABLET | Freq: Four times a day (QID) | ORAL | Status: AC | PRN
  Administered 2023-09-04: 25 mg via ORAL
  Filled 2023-09-02: qty 1

## 2023-09-02 MED ORDER — ONDANSETRON 4 MG PO TBDP: 4.0000 mg | ORAL_TABLET | Freq: Four times a day (QID) | ORAL | Status: AC | PRN

## 2023-09-02 MED ORDER — ALUM & MAG HYDROXIDE-SIMETH 200-200-20 MG/5ML PO SUSP: 30.0000 mL | ORAL | Status: DC | PRN

## 2023-09-02 MED ORDER — CLONIDINE HCL 0.1 MG PO TABS
0.1000 mg | ORAL_TABLET | Freq: Four times a day (QID) | ORAL | Status: AC
  Administered 2023-09-02 – 2023-09-03 (×7): 0.1 mg via ORAL
  Filled 2023-09-02 (×8): qty 1

## 2023-09-02 MED ORDER — DICYCLOMINE HCL 20 MG PO TABS: 20.0000 mg | ORAL_TABLET | Freq: Four times a day (QID) | ORAL | Status: AC | PRN

## 2023-09-02 MED ORDER — TRAZODONE HCL 50 MG PO TABS
50.0000 mg | ORAL_TABLET | Freq: Every evening | ORAL | Status: DC | PRN
  Administered 2023-09-04 – 2023-09-09 (×6): 50 mg via ORAL
  Filled 2023-09-02 (×6): qty 1

## 2023-09-02 MED ORDER — CLONIDINE HCL 0.1 MG PO TABS: 0.1000 mg | ORAL_TABLET | Freq: Every day | ORAL | Status: DC

## 2023-09-02 MED ORDER — CLONIDINE HCL 0.1 MG PO TABS
0.1000 mg | ORAL_TABLET | ORAL | Status: DC
  Administered 2023-09-04 – 2023-09-05 (×3): 0.1 mg via ORAL
  Filled 2023-09-02 (×3): qty 1

## 2023-09-02 MED ORDER — HALOPERIDOL LACTATE 5 MG/ML IJ SOLN: 10.0000 mg | Freq: Three times a day (TID) | INTRAMUSCULAR | Status: DC | PRN

## 2023-09-02 MED ORDER — DIPHENHYDRAMINE HCL 50 MG PO CAPS
50.0000 mg | ORAL_CAPSULE | Freq: Three times a day (TID) | ORAL | Status: DC | PRN
  Administered 2023-09-02: 50 mg via ORAL
  Filled 2023-09-02: qty 1

## 2023-09-02 MED ORDER — HALOPERIDOL 5 MG PO TABS
5.0000 mg | ORAL_TABLET | Freq: Two times a day (BID) | ORAL | Status: DC
  Administered 2023-09-02 – 2023-09-04 (×5): 5 mg via ORAL
  Filled 2023-09-02 (×5): qty 1

## 2023-09-02 MED ORDER — DIPHENHYDRAMINE HCL 50 MG/ML IJ SOLN: 50.0000 mg | Freq: Three times a day (TID) | INTRAMUSCULAR | Status: DC | PRN

## 2023-09-02 MED ORDER — QUETIAPINE FUMARATE 25 MG PO TABS
25.0000 mg | ORAL_TABLET | Freq: Every day | ORAL | Status: DC
Start: 2023-09-02 — End: 2023-09-02
  Administered 2023-09-02: 25 mg via ORAL
  Filled 2023-09-02: qty 1

## 2023-09-02 MED ORDER — NAPROXEN 500 MG PO TABS
500.0000 mg | ORAL_TABLET | Freq: Two times a day (BID) | ORAL | Status: DC | PRN
Start: 2023-09-02 — End: 2023-09-07

## 2023-09-02 MED ORDER — HALOPERIDOL 5 MG PO TABS: 5.0000 mg | ORAL_TABLET | Freq: Three times a day (TID) | ORAL | Status: DC | PRN

## 2023-09-02 NOTE — ED Notes (Signed)
 Pt was dc via safe transport to [facility]

## 2023-09-02 NOTE — ED Provider Notes (Signed)
 Facility Based Crisis Admission H&P  Date: 09/02/23 Patient Name: Jerry Garrison MRN: [id number] Chief Complaint: " I just feel like I'm a problem to myself".  Diagnoses:  Final diagnoses:  Methamphetamine abuse (HCC)  Polysubstance abuse (HCC)  Substance induced mood disorder (HCC)  Non compliance w medication regimen    HPI: Patient is a 23 year old male with a psychiatric history schizophrenia versus schizoaffective disorder and methamphetamine abuse, who was arrived the [facility] as a direct admit to [facility] from [facility] with complaints of paranoia and methamphetamine abuse.  Patient is seeking detox.  Patient was seen face-to-face by this provider and chart reviewed.. Per chart review, patient has had 5 ED visits and 1 inpatient psychiatric hospitalization in the past 6 months. Patient was recently admitted to the [facility] between 07/07/23-07/12/23 for worsening psychosis.  On evaluation, patient is alert, oriented x 3, and cooperative. Speech is clear, and coherent. Pt appears casual. Eye contact is poor. Mood is anxious and depressed, affect is congruent with mood. Thought process is coherent and thought content is WDL.Marland Kitchen Pt denies SI/HI/AVH. There is no objective indication that the patient is responding to internal stimuli. No delusions elicited during this assessment.    Patient reports "I was having some paranoia and my mom told me I should go to the doctors, it was about my health, that like I'm my enemy, like smoking and stuff".  Patient reports he smokes marijuana daily and methamphetamine "if I could every day".  He denies other illicit substance use. Patient reports history of smoking meth since 2023 and states "I did some meth a week ago, but last time was in the morning before I went to the hospital, I just feel like I'm a problem to myself, I think I'm depressed I lack the drive and I think I'm codependent with any and everybody".  Patient denies current withdrawal symptoms.  However he  presents with nasal congestion/drip.  Patient lives with his mother and denies access to a gun or weapon.  Patient endorses a history of suicidal ideation which began in 2023.  Patient also endorses a history of self-harm by cutting.  Patient reports poor sleep and poor appetite. Patient is unemployed. Patient reports he is not established with an outpatient psychiatric provider.  and currently not taking any psychiatric medications.  Support, encouragement, reassurance provided about ongoing stressors.  Patient is provided with opportunity for questions.  Patient completed the PHQ-9 questionnaire and obtained a total score of 18, indicating moderately severe depression.  Discussed recommendation for admission to the [facility] for substance abuse treatment.  Patient is provided with opportunities for question.  He verbalizes his understanding and is in agreement.  PHQ 2-9:  Flowsheet Row ED from 09/02/2023 in [facility] ED from 06/24/2022 in [facility] ED from 10/22/2021 in [facility] Emergency Department at [facility]  Thoughts that you would be better off dead, or of hurting yourself in some way Several days Several days Not at all  PHQ-9 Total Score 18 11 17        Flowsheet Row ED from 09/02/2023 in [facility] ED from 09/01/2023 in [facility] Emergency Department at [facility] Admission (Discharged[facility]  C-SSRS RISK CATEGORY Error: Q3, 4, or 5 should not be populated when Q2 is No No Risk High Risk         Total Time spent with patient: 30 minutes  Musculoskeletal  Strength & Muscle Tone: within normal limits Gait & Station: normal Patient leans: N/A  Psychiatric Specialty Exam  Presentation General Appearance:  Casual  Eye Contact: Poor  Speech: Clear and Coherent; Slow  Speech  Volume: Decreased  Handedness: Right   Mood and Affect  Mood: Depressed; Anxious  Affect: Congruent   Thought Process  Thought Processes: Coherent  Descriptions of Associations:Loose  Orientation:Full (Time, Place and Person)  Thought Content:Paranoid Ideation  Diagnosis of Schizophrenia or Schizoaffective disorder in past: Yes  Duration of Psychotic Symptoms: Greater than six months  Hallucinations:Hallucinations: None  Ideas of Reference:Paranoia  Suicidal Thoughts:Suicidal Thoughts: No  Homicidal Thoughts:Homicidal Thoughts: No   Sensorium  Memory: Immediate Fair  Judgment: Poor  Insight: Poor   Executive Functions  Concentration: Fair  Attention Span: Fair  Recall: Fair  Fund of Knowledge: Fair  Language: Fair   Psychomotor Activity  Psychomotor Activity: Psychomotor Activity: Normal   Assets  Assets: Communication Skills; Desire for Improvement   Sleep  Sleep: Sleep: Poor   Nutritional Assessment (For OBS and FBC admissions only) Has the patient had a weight loss or gain of 10 pounds or more in the last 3 months?: No Has the patient had a decrease in food intake/or appetite?: No Does the patient have dental problems?: No Does the patient have eating habits or behaviors that may be indicators of an eating disorder including binging or inducing vomiting?: No Has the patient recently lost weight without trying?: 0 Has the patient been eating poorly because of a decreased appetite?: 0 Malnutrition Screening Tool Score: 0    Physical Exam Constitutional:      General: He is not in acute distress.    Appearance: He is not diaphoretic.  HENT:     Right Ear: External ear normal.     Left Ear: External ear normal.     Nose: Rhinorrhea present.  Pulmonary:     Effort: No respiratory distress.  Chest:     Chest wall: No tenderness.  Neurological:     Mental Status: He is alert and oriented to person, place, and time.   Psychiatric:        Attention and Perception: Attention and perception normal.        Mood and Affect: Mood is anxious and depressed.        Speech: Speech normal.        Behavior: Behavior is cooperative.        Thought Content: Thought content is paranoid.    Review of Systems  Constitutional:  Negative for chills, diaphoresis and fever.  HENT:  Positive for congestion.   Respiratory:  Negative for cough, shortness of breath and wheezing.   Cardiovascular:  Negative for chest pain and palpitations.  Gastrointestinal:  Negative for diarrhea, nausea and vomiting.  Neurological:  Negative for dizziness, seizures and headaches.  Psychiatric/Behavioral:  Positive for depression and substance abuse. The patient is nervous/anxious.      Past Psychiatric History: see H & P   Is the patient at risk to self? Y Has the patient been a risk to self in the past 6 months? Yes .    Has the patient been a risk to self within the distant past? Yes   Is the patient a risk to others? No   Has the patient been a risk to others in the past 6 months? No   Has the patient been a risk to others within the distant past? No   Past Medical History:  See Chart Family History: N/A Social History: N/A  Last Labs:  Admission on 09/01/2023, Discharged on 09/02/2023  Component Date Value Ref Range Status   WBC 09/01/2023 6.0  4.0 - 10.5 K/uL Final   RBC 09/01/2023 4.45  4.22 - 5.81 MIL/uL Final   Hemoglobin 09/01/2023 13.0  13.0 - 17.0 g/dL Final   HCT 69/62/9528 39.3  39.0 - 52.0 % Final   MCV 09/01/2023 88.3  80.0 - 100.0 fL Final   MCH 09/01/2023 29.2  26.0 - 34.0 pg Final   MCHC 09/01/2023 33.1  30.0 - 36.0 g/dL Final   RDW 41/32/4401 12.1  11.5 - 15.5 % Final   Platelets 09/01/2023 207  150 - 400 K/uL Final   nRBC 09/01/2023 0.0  0.0 - 0.2 % Final   Neutrophils Relative % 09/01/2023 66  % Final   Neutro Abs 09/01/2023 4.0  1.7 - 7.7 K/uL Final   Lymphocytes Relative 09/01/2023 14  % Final    Lymphs Abs 09/01/2023 0.8  0.7 - 4.0 K/uL Final   Monocytes Relative 09/01/2023 18  % Final   Monocytes Absolute 09/01/2023 1.1 (H)  0.1 - 1.0 K/uL Final   Eosinophils Relative 09/01/2023 1  % Final   Eosinophils Absolute 09/01/2023 0.1  0.0 - 0.5 K/uL Final   Basophils Relative 09/01/2023 1  % Final   Basophils Absolute 09/01/2023 0.0  0.0 - 0.1 K/uL Final   Immature Granulocytes 09/01/2023 0  % Final   Abs Immature Granulocytes 09/01/2023 0.02  0.00 - 0.07 K/uL Final   Performed at [facility], [address] [place], [place] [address]   Sodium 09/01/2023 134 (L)  135 - 145 mmol/L Final   Potassium 09/01/2023 3.5  3.5 - 5.1 mmol/L Final   Chloride 09/01/2023 101  98 - 111 mmol/L Final   CO2 09/01/2023 22  22 - 32 mmol/L Final   Glucose, Bld 09/01/2023 102 (H)  70 - 99 mg/dL Final   Glucose reference range applies only to samples taken after fasting for at least 8 hours.   BUN 09/01/2023 16  6 - 20 mg/dL Final   Creatinine, Ser 09/01/2023 0.92  0.61 - 1.24 mg/dL Final   Calcium 36/64/4034 8.8 (L)  8.9 - 10.3 mg/dL Final   Total Protein 74/25/9563 7.2  6.5 - 8.1 g/dL Final   Albumin 87/56/4332 4.3  3.5 - 5.0 g/dL Final   AST 95/18/8416 19  15 - 41 U/L Final   ALT 09/01/2023 14  0 - 44 U/L Final   Alkaline Phosphatase 09/01/2023 49  38 - 126 U/L Final   Total Bilirubin 09/01/2023 2.2 (H)  0.0 - 1.2 mg/dL Final   GFR, Estimated 09/01/2023 >60  >60 mL/min Final   Comment: (NOTE) Calculated using the CKD-EPI Creatinine Equation (2021)    Anion gap 09/01/2023 11  5 - 15 Final   Performed at [facility] [address], [place], [place] [address]   Alcohol, Ethyl (B) 09/01/2023 <10  <10 mg/dL Final   Comment: (NOTE) Lowest detectable limit for serum alcohol is 10 mg/dL.  For medical purposes only. Performed at [facility], [address], [place], [place] [address]    Acetaminophen (Tylenol), Serum 09/01/2023 <10 (L)  10 -  30 ug/mL Final   Comment: (NOTE) Therapeutic concentrations vary significantly. A range of 10-30 ug/mL  may be an effective concentration for many patients. However, some  are best treated at concentrations outside of this range.  Acetaminophen concentrations >150 ug/mL at 4 hours after ingestion  and >50 ug/mL at 12 hours after ingestion are often associated with  toxic reactions.  Performed at [facility], [address], [place], [place] [address]    Salicylate Lvl 09/01/2023 <7.0 (L)  7.0 - 30.0 mg/dL Final   Performed at [facility], [address], [place], [place] [address]   Opiates 09/01/2023 NONE DETECTED  NONE DETECTED Final   Cocaine 09/01/2023 NONE DETECTED  NONE DETECTED Final   Benzodiazepines 09/01/2023 NONE DETECTED  NONE DETECTED Final   Amphetamines 09/01/2023 NONE DETECTED  NONE DETECTED Final   Tetrahydrocannabinol 09/01/2023 POSITIVE (A)  NONE DETECTED Final   Barbiturates 09/01/2023 NONE DETECTED  NONE DETECTED Final   Comment: (NOTE) DRUG SCREEN FOR MEDICAL PURPOSES ONLY.  IF CONFIRMATION IS NEEDED FOR ANY PURPOSE, NOTIFY LAB WITHIN 5 DAYS.  LOWEST DETECTABLE LIMITS FOR URINE DRUG SCREEN Drug Class                     Cutoff (ng/mL) Amphetamine and metabolites    1000 Barbiturate and metabolites    200 Benzodiazepine                 200 Opiates and metabolites        300 Cocaine and metabolites        300 THC                            50 Performed at [facility], [address], [place], [place] [address]   Admission on 07/07/2023, Discharged on 07/12/2023  Component Date Value Ref Range Status   Cholesterol 07/07/2023 117  0 - 200 mg/dL Final   Triglycerides 91/47/8295 22  <150 mg/dL Final   HDL 62/13/0865 56  >40 mg/dL Final   Total CHOL/HDL Ratio 07/07/2023 2.1  RATIO Final   VLDL 07/07/2023 4  0 - 40 mg/dL Final   LDL Cholesterol 07/07/2023 57  0 - 99 mg/dL Final   Comment:         Total Cholesterol/HDL:CHD Risk Coronary Heart Disease Risk Table                     Men   Women  1/2 Average Risk   3.4   3.3  Average Risk       5.0   4.4  2 X Average Risk   9.6   7.1  3 X Average Risk  23.4   11.0        Use the calculated Patient Ratio above and the CHD Risk Table to determine the patient's CHD Risk.        ATP III CLASSIFICATION (LDL):  <100     mg/dL   Optimal  784-696  mg/dL   Near or Above                    Optimal  130-159  mg/dL   Borderline  295-284  mg/dL   High  >132     mg/dL   Very High Performed at [facility], [address], [place], [place] [address]    Hgb A1c MFr Bld 07/07/2023 5.1  4.8 - 5.6 % Final   Comment: (NOTE)         Prediabetes: 5.7 - 6.4         Diabetes: >6.4  Glycemic control for adults with diabetes: <7.0    Mean Plasma Glucose 07/07/2023 100  mg/dL Final   Comment: (NOTE) Performed At: [facility] [address] [place], [place] [id number] Jolene Schimke MD QI:[id number]    TSH 07/07/2023 2.114  0.350 - 4.500 uIU/mL Final   Comment: Performed by a 3rd Generation assay with a functional sensitivity of <=0.01 uIU/mL. Performed at [facility], [address] [place], [place] [address]   Admission on 07/06/2023, Discharged on 07/06/2023  Component Date Value Ref Range Status   WBC 07/06/2023 6.6  4.0 - 10.5 K/uL Final   RBC 07/06/2023 5.23  4.22 - 5.81 MIL/uL Final   Hemoglobin 07/06/2023 15.0  13.0 - 17.0 g/dL Final   HCT 05/02/2535 45.4  39.0 - 52.0 % Final   MCV 07/06/2023 86.8  80.0 - 100.0 fL Final   MCH 07/06/2023 28.7  26.0 - 34.0 pg Final   MCHC 07/06/2023 33.0  30.0 - 36.0 g/dL Final   RDW 64/40/3474 11.9  11.5 - 15.5 % Final   Platelets 07/06/2023 236  150 - 400 K/uL Final   nRBC 07/06/2023 0.0  0.0 - 0.2 % Final   Neutrophils Relative % 07/06/2023 59  % Final   Neutro Abs 07/06/2023 3.9  1.7 - 7.7 K/uL Final   Lymphocytes Relative 07/06/2023 27  %  Final   Lymphs Abs 07/06/2023 1.8  0.7 - 4.0 K/uL Final   Monocytes Relative 07/06/2023 14  % Final   Monocytes Absolute 07/06/2023 0.9  0.1 - 1.0 K/uL Final   Eosinophils Relative 07/06/2023 0  % Final   Eosinophils Absolute 07/06/2023 0.0  0.0 - 0.5 K/uL Final   Basophils Relative 07/06/2023 0  % Final   Basophils Absolute 07/06/2023 0.0  0.0 - 0.1 K/uL Final   Immature Granulocytes 07/06/2023 0  % Final   Abs Immature Granulocytes 07/06/2023 0.01  0.00 - 0.07 K/uL Final   Performed at [facility], [address], [place], [place] [address]   Sodium 07/06/2023 136  135 - 145 mmol/L Final   Potassium 07/06/2023 3.3 (L)  3.5 - 5.1 mmol/L Final   Chloride 07/06/2023 101  98 - 111 mmol/L Final   CO2 07/06/2023 25  22 - 32 mmol/L Final   Glucose, Bld 07/06/2023 85  70 - 99 mg/dL Final   Glucose reference range applies only to samples taken after fasting for at least 8 hours.   BUN 07/06/2023 7  6 - 20 mg/dL Final   Creatinine, Ser 07/06/2023 1.03  0.61 - 1.24 mg/dL Final   Calcium 38/75/6433 9.6  8.9 - 10.3 mg/dL Final   Total Protein 29/51/8841 7.4  6.5 - 8.1 g/dL Final   Albumin 66/12/3014 4.6  3.5 - 5.0 g/dL Final   AST 07/14/3233 20  15 - 41 U/L Final   ALT 07/06/2023 18  0 - 44 U/L Final   Alkaline Phosphatase 07/06/2023 53  38 - 126 U/L Final   Total Bilirubin 07/06/2023 2.9 (H)  0.0 - 1.2 mg/dL Final   GFR, Estimated 07/06/2023 >60  >60 mL/min Final   Comment: (NOTE) Calculated using the CKD-EPI Creatinine Equation (2021)    Anion gap 07/06/2023 10  5 - 15 Final   Performed at [facility], [address], [place], [place] [address]   Alcohol, Ethyl (B) 07/06/2023 <10  <10 mg/dL Final   Comment: (NOTE) Lowest detectable limit for serum alcohol is 10 mg/dL.  For medical  purposes only. Performed at [facility], [address] [place], [place] [address]    TSH 07/06/2023 3.214  0.350 - 4.500 uIU/mL Final   Comment: Performed by a 3rd Generation assay  with a functional sensitivity of <=0.01 uIU/mL. Performed at [facility], [address], [place], [place] [address]    POC Amphetamine UR 07/06/2023 None Detected  NONE DETECTED (Cut Off Level 1000 ng/mL) Final   POC Secobarbital (BAR) 07/06/2023 None Detected  NONE DETECTED (Cut Off Level 300 ng/mL) Final   POC Buprenorphine (BUP) 07/06/2023 None Detected  NONE DETECTED (Cut Off Level 10 ng/mL) Final   POC Oxazepam (BZO) 07/06/2023 None Detected  NONE DETECTED (Cut Off Level 300 ng/mL) Final   POC Cocaine UR 07/06/2023 None Detected  NONE DETECTED (Cut Off Level 300 ng/mL) Final   POC Methamphetamine UR 07/06/2023 None Detected  NONE DETECTED (Cut Off Level 1000 ng/mL) Final   POC Morphine 07/06/2023 None Detected  NONE DETECTED (Cut Off Level 300 ng/mL) Final   POC Methadone UR 07/06/2023 None Detected  NONE DETECTED (Cut Off Level 300 ng/mL) Final   POC Oxycodone UR 07/06/2023 None Detected  NONE DETECTED (Cut Off Level 100 ng/mL) Final   POC Marijuana UR 07/06/2023 None Detected  NONE DETECTED (Cut Off Level 50 ng/mL) Final    Allergies: Latex and Grass pollen([id number] vern)  Medications:  Facility Ordered Medications  Medication   acetaminophen (TYLENOL) tablet 650 mg   alum & mag hydroxide-simeth (MAALOX/MYLANTA) 200-200-20 MG/5ML suspension 30 mL   magnesium hydroxide (MILK OF MAGNESIA) suspension 30 mL   dicyclomine (BENTYL) tablet 20 mg   hydrOXYzine (ATARAX) tablet 25 mg   loperamide (IMODIUM) capsule 2-4 mg   methocarbamol (ROBAXIN) tablet 500 mg   naproxen (NAPROSYN) tablet 500 mg   ondansetron (ZOFRAN-ODT) disintegrating tablet 4 mg   haloperidol (HALDOL) tablet 5 mg   And   diphenhydrAMINE (BENADRYL) capsule 50 mg   haloperidol lactate (HALDOL) injection 5 mg   And   diphenhydrAMINE (BENADRYL) injection 50 mg   And   LORazepam (ATIVAN) injection 2 mg   haloperidol lactate (HALDOL) injection 10 mg   And   diphenhydrAMINE (BENADRYL) injection 50 mg   And    LORazepam (ATIVAN) injection 2 mg   traZODone (DESYREL) tablet 50 mg   cloNIDine (CATAPRES) tablet 0.1 mg   Followed by   Melene Muller ON 09/04/2023] cloNIDine (CATAPRES) tablet 0.1 mg   Followed by   Melene Muller ON 09/06/2023] cloNIDine (CATAPRES) tablet 0.1 mg   QUEtiapine (SEROQUEL) tablet 25 mg   oxymetazoline (AFRIN) 0.05 % nasal spray 1 spray   PTA Medications  Medication Sig   haloperidol (HALDOL) 5 MG tablet Take 1 tablet (5 mg total) by mouth at bedtime for 10 days.   haloperidol decanoate (HALDOL DECANOATE) 100 MG/ML injection Inject 0.5 mLs (50 mg total) into the muscle every 30 (thirty) days for 1 dose. Next dose is due to be administerred on 08-10-23.    Long Term Goals: Improvement in symptoms so as ready for discharge  Short Term Goals: Patient will verbalize feelings in meetings with treatment team members., Patient will attend at least of 50% of the groups daily., Pt will complete the PHQ9 on admission, day 3 and discharge., Patient will participate in completing the [place] Suicide Severity Rating Scale, Patient will score a low risk of violence for 24 hours prior to discharge, and Patient will take medications as prescribed daily.  Medical Decision Making  Recommend admission  to the [facility] for substance abuse treatment.   I have reviewed the labs completed at [facility]: BAL  WNL, UDS positive for THC CBC, CMP unremarkable.  Recommend COWS protocol meds. Recommend EKG  Recommend agitation protocol medications.  Medications started -Seroquel 25 mg p.o. nightly for psychosis/mood  Other PRNs -Trazodone 50 mg p.o. nightly as needed insomnia -Afrin nasal spray, twice daily 3 days -MOM 30 mL p.o. daily as needed constipation -Maalox 30 mL p.o. every 4 hours as needed indigestion -Tylenol 650 mg p.o. every 6 hours as needed pain  Recommendations  Based on my evaluation the patient does not appear to have an emergency medical condition.  Recommend admission to the [facility] for substance  abuse treatment.  Mancel Bale, NP 09/02/23  3:49 AM    Isa Rankin, MD 09/07/23 1758

## 2023-09-02 NOTE — ED Notes (Signed)
 Patient in the bedroom calm and sleeping. NAD. Respirations are even and unlabored. Will continue to monitor for safety.

## 2023-09-02 NOTE — Group Note (Signed)
 Group Topic: Relapse and Recovery  Group Date: 09/02/2023 Start Time: 2000 End Time: 2100 Facilitators: Rae Lips B  Department: [facility]  Number of Participants: 5  Group Focus: abuse issues, acceptance, activities of daily living skills, and chemical dependency education Treatment Modality:  Leisure Development Interventions utilized were leisure development, patient education, and problem solving Purpose: enhance coping skills, express feelings, increase insight, regain self-worth, reinforce self-care, and relapse prevention strategies  Name: Jerry Garrison Date of Birth: January 18, 2001  MR: [id number]    Level of Participation: PT DID NOT ATTEND GROUPS Quality of Participation: cooperative and isolative Interactions with others: gave feedback Mood/Affect: appropriate and brightens with interaction Triggers (if applicable): NA Cognition: coherent/clear Progress: None Response: Pt was in the bed I finally got him out of the bed to come out. Plan: patient will be encouraged to go to groups.   Patients Problems:  Patient Active Problem List   Diagnosis Date Noted   Methamphetamine use disorder, severe, dependence (HCC) 09/02/2023   Schizophrenia, unspecified (HCC) 01/27/2022   Tobacco use disorder 01/27/2022   Cannabis use disorder 01/27/2022   Accidental drug overdose    Overdose of opiate or related narcotic, accidental or unintentional, initial encounter (HCC) 01/25/2022   Acute respiratory failure with hypoxia (HCC) 01/25/2022   Aspiration pneumonia (HCC) 01/25/2022   Transaminitis 01/25/2022   Amphetamine use disorder, severe (HCC)    Schizo affective schizophrenia (HCC) 09/01/2021   Schizophrenia (HCC) 08/07/2021   Brief psychotic disorder (HCC) 01/06/2021   Threatening to others 12/12/2020

## 2023-09-02 NOTE — ED Notes (Signed)
 Patient is calm and cooperative with care.  He met with provider and is now back resting in bed.  No distress.  He continues to thought block and has some confusion and difficulty expressing himself.  Will monitor and maintain safety.

## 2023-09-02 NOTE — Group Note (Signed)
 Group Topic: Healthy Self Image and Positive Change  Group Date: 09/02/2023 Start Time: 1130 End Time: 1207 Facilitators: Vonzell Schlatter B  Department: [facility]  Number of Participants: 5  Group Focus: daily focus Treatment Modality:  Psychoeducation Interventions utilized were problem solving Purpose: regain self-worth  Name: Jerry Garrison Date of Birth: 11-16-2000  MR: [id number]    Level of Participation: Did not attend group Quality of Participation:  Interactions with others:  Mood/Affect:  Triggers (if applicable):  Cognition:  Progress:  Response:  Plan: follow-up needed  Patients Problems:  Patient Active Problem List   Diagnosis Date Noted   Methamphetamine use disorder, severe, dependence (HCC) 09/02/2023   Schizophrenia, unspecified (HCC) 01/27/2022   Tobacco use disorder 01/27/2022   Cannabis use disorder 01/27/2022   Accidental drug overdose    Overdose of opiate or related narcotic, accidental or unintentional, initial encounter (HCC) 01/25/2022   Acute respiratory failure with hypoxia (HCC) 01/25/2022   Aspiration pneumonia (HCC) 01/25/2022   Transaminitis 01/25/2022   Amphetamine use disorder, severe (HCC)    Schizo affective schizophrenia (HCC) 09/01/2021   Schizophrenia (HCC) 08/07/2021   Brief psychotic disorder (HCC) 01/06/2021   Threatening to others 12/12/2020
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
## 2023-09-02 NOTE — ED Notes (Signed)
 Pt sleeping at present, no distress noted.  Monitoring for safety.

## 2023-09-02 NOTE — ED Notes (Signed)
 Pt A&O x 4, calm & cooperative, presents with paranoia and hopelessness.    Pt requesting help for methamphetamine abuse.  Denies SI, HI or AVH.  Comfort measures given.  Monitoring for safety.

## 2023-09-02 NOTE — ED Provider Notes (Signed)
 Behavioral Health Progress Note  Date and Time: 09/02/2023 10:48 AM Name: Jerry Garrison MRN:  [id number]  Subjective: The patient is a 23 year old male the patient is a 23 year old male with type prior psychiatric history of schizophrenia was in schizoaffective disorder and methamphetamine abuse who arrived at the [facility] as a direct admit to [facility] from [facility] to ED with complaints of paranoia and methamphetamine abuse.  Patient is seeking detox. He was seen and evaluated and admitted yesterday.  When seen today the patient reports that he has had at least 5 ED visits and 1 inpatient psychiatric hospitalization in the past 6 months.  He was recently admitted [facility] between 07/07/2023 to 07/12/2023 for worsening psychosis. When seen today the patient was alert oriented x 3 and fairly cooperative.  His speech is clear coherent but hesitant with significant thought blocking.  His eye contact is poor his mood is anxious and he appears somewhat withdrawn and flat in his affect.  He denies any active suicidal or homicidal ideations or hallucinations.  He is not very clear as to why he is here and he claims that all he does is use marijuana and did not report using amphetamines. He states that he was feeling very paranoid at home his mother told him that he should go see the doctors. Patient states that he uses marijuana daily and possibly also uses methamphetamine daily.  He denies any other illicit substance use. He is willing to seek treatment and is contracting for safety.  No active SI/HI/AVH noted today.  He does have significant thought blocking.  Diagnosis:  Final diagnoses:  Methamphetamine abuse (HCC)  Polysubstance abuse (HCC)  Substance induced mood disorder (HCC)  Non compliance w medication regimen    Total Time spent with patient: 30 minutes  Past Psychiatric History: Past psychiatric history significant for history of at least 1 hospitalization to [facility] for psychosis.  He also has a history of  methamphetamine and marijuana abuse. Past Medical History: None noted Family History: Unknown Family Psychiatric  History: Unknown Social History: Patient is single and reports that he completed high school.  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  Additional Social History:                         Sleep: Fair  Appetite:  Fair  Current Medications:  Current Facility-Administered Medications  Medication Dose Route Frequency Provider Last Rate Last Admin   acetaminophen (TYLENOL) tablet 650 mg  650 mg Oral Q6H PRN Onuoha, Chinwendu V, NP       alum & mag hydroxide-simeth (MAALOX/MYLANTA) 200-200-20 MG/5ML suspension 30 mL  30 mL Oral Q4H PRN Onuoha, Chinwendu V, NP       cloNIDine (CATAPRES) tablet 0.1 mg  0.1 mg Oral QID Onuoha, Chinwendu V, NP   0.1 mg at 09/02/23 8657   Followed by   Melene Muller ON 09/04/2023] cloNIDine (CATAPRES) tablet 0.1 mg  0.1 mg Oral BH-qamhs Onuoha, Chinwendu V, NP       Followed by   Melene Muller ON 09/06/2023] cloNIDine (CATAPRES) tablet 0.1 mg  0.1 mg Oral QAC breakfast Onuoha, Chinwendu V, NP       dicyclomine (BENTYL) tablet 20 mg  20 mg Oral Q6H PRN Onuoha, Chinwendu V, NP       haloperidol (HALDOL) tablet 5 mg  5 mg Oral TID PRN Welford Roche, Chinwendu V, NP  And   diphenhydrAMINE (BENADRYL) capsule 50 mg  50 mg Oral TID PRN Onuoha, Chinwendu V, NP   50 mg at 09/02/23 0255   haloperidol lactate (HALDOL) injection 5 mg  5 mg Intramuscular TID PRN Onuoha, Chinwendu V, NP       And   diphenhydrAMINE (BENADRYL) injection 50 mg  50 mg Intramuscular TID PRN Onuoha, Chinwendu V, NP       And   LORazepam (ATIVAN) injection 2 mg  2 mg Intramuscular TID PRN Onuoha, Chinwendu V, NP       haloperidol lactate (HALDOL) injection 10 mg  10 mg Intramuscular TID PRN Onuoha, Chinwendu V, NP       And   diphenhydrAMINE (BENADRYL) injection 50 mg  50 mg Intramuscular TID PRN Onuoha, Chinwendu V, NP       And   LORazepam (ATIVAN)  injection 2 mg  2 mg Intramuscular TID PRN Onuoha, Chinwendu V, NP       hydrOXYzine (ATARAX) tablet 25 mg  25 mg Oral Q6H PRN Onuoha, Chinwendu V, NP       loperamide (IMODIUM) capsule 2-4 mg  2-4 mg Oral PRN Onuoha, Chinwendu V, NP       magnesium hydroxide (MILK OF MAGNESIA) suspension 30 mL  30 mL Oral Daily PRN Onuoha, Chinwendu V, NP       methocarbamol (ROBAXIN) tablet 500 mg  500 mg Oral Q8H PRN Onuoha, Chinwendu V, NP       naproxen (NAPROSYN) tablet 500 mg  500 mg Oral BID PRN Onuoha, Chinwendu V, NP       ondansetron (ZOFRAN-ODT) disintegrating tablet 4 mg  4 mg Oral Q6H PRN Onuoha, Chinwendu V, NP       oxymetazoline (AFRIN) 0.05 % nasal spray 1 spray  1 spray Each Nare BID PRN Onuoha, Chinwendu V, NP       QUEtiapine (SEROQUEL) tablet 25 mg  25 mg Oral QHS Onuoha, Chinwendu V, NP   25 mg at 09/02/23 0255   traZODone (DESYREL) tablet 50 mg  50 mg Oral QHS PRN Onuoha, Chinwendu V, NP       Current Outpatient Medications  Medication Sig Dispense Refill   haloperidol (HALDOL) 5 MG tablet Take 1 tablet (5 mg total) by mouth at bedtime for 10 days. 10 tablet 0   haloperidol decanoate (HALDOL DECANOATE) 100 MG/ML injection Inject 0.5 mLs (50 mg total) into the muscle every 30 (thirty) days for 1 dose. Next dose is due to be administerred on 08-10-23. 0.5 mL 0    Labs  Lab Results:  Admission on 09/01/2023, Discharged on 09/02/2023  Component Date Value Ref Range Status   WBC 09/01/2023 6.0  4.0 - 10.5 K/uL Final   RBC 09/01/2023 4.45  4.22 - 5.81 MIL/uL Final   Hemoglobin 09/01/2023 13.0  13.0 - 17.0 g/dL Final   HCT 16/04/9603 39.3  39.0 - 52.0 % Final   MCV 09/01/2023 88.3  80.0 - 100.0 fL Final   MCH 09/01/2023 29.2  26.0 - 34.0 pg Final   MCHC 09/01/2023 33.1  30.0 - 36.0 g/dL Final   RDW 54/03/8118 12.1  11.5 - 15.5 % Final   Platelets 09/01/2023 207  150 - 400 K/uL Final   nRBC 09/01/2023 0.0  0.0 - 0.2 % Final   Neutrophils Relative % 09/01/2023 66  % Final   Neutro Abs  09/01/2023 4.0  1.7 - 7.7 K/uL Final   Lymphocytes Relative 09/01/2023 14  % Final   Lymphs  Abs 09/01/2023 0.8  0.7 - 4.0 K/uL Final   Monocytes Relative 09/01/2023 18  % Final   Monocytes Absolute 09/01/2023 1.1 (H)  0.1 - 1.0 K/uL Final   Eosinophils Relative 09/01/2023 1  % Final   Eosinophils Absolute 09/01/2023 0.1  0.0 - 0.5 K/uL Final   Basophils Relative 09/01/2023 1  % Final   Basophils Absolute 09/01/2023 0.0  0.0 - 0.1 K/uL Final   Immature Granulocytes 09/01/2023 0  % Final   Abs Immature Granulocytes 09/01/2023 0.02  0.00 - 0.07 K/uL Final   Performed at [facility], [address] [place], [place] [address]   Sodium 09/01/2023 134 (L)  135 - 145 mmol/L Final   Potassium 09/01/2023 3.5  3.5 - 5.1 mmol/L Final   Chloride 09/01/2023 101  98 - 111 mmol/L Final   CO2 09/01/2023 22  22 - 32 mmol/L Final   Glucose, Bld 09/01/2023 102 (H)  70 - 99 mg/dL Final   Glucose reference range applies only to samples taken after fasting for at least 8 hours.   BUN 09/01/2023 16  6 - 20 mg/dL Final   Creatinine, Ser 09/01/2023 0.92  0.61 - 1.24 mg/dL Final   Calcium 60/45/4098 8.8 (L)  8.9 - 10.3 mg/dL Final   Total Protein 11/91/4782 7.2  6.5 - 8.1 g/dL Final   Albumin 95/62/1308 4.3  3.5 - 5.0 g/dL Final   AST 65/78/4696 19  15 - 41 U/L Final   ALT 09/01/2023 14  0 - 44 U/L Final   Alkaline Phosphatase 09/01/2023 49  38 - 126 U/L Final   Total Bilirubin 09/01/2023 2.2 (H)  0.0 - 1.2 mg/dL Final   GFR, Estimated 09/01/2023 >60  >60 mL/min Final   Comment: (NOTE) Calculated using the CKD-EPI Creatinine Equation (2021)    Anion gap 09/01/2023 11  5 - 15 Final   Performed at [facility], [address] [place], [place] [address]   Alcohol, Ethyl (B) 09/01/2023 <10  <10 mg/dL Final   Comment: (NOTE) Lowest detectable limit for serum alcohol is 10 mg/dL.  For medical purposes only. Performed at [facility], [address], [place], [place] [address]    Acetaminophen (Tylenol), Serum 09/01/2023 <10 (L)  10 - 30 ug/mL Final   Comment: (NOTE) Therapeutic concentrations vary significantly. A range of 10-30 ug/mL  may be an effective concentration for many patients. However, some  are best treated at concentrations outside of this range. Acetaminophen concentrations >150 ug/mL at 4 hours after ingestion  and >50 ug/mL at 12 hours after ingestion are often associated with  toxic reactions.  Performed at [facility], [address], [place], [place] [address]    Salicylate Lvl 09/01/2023 <7.0 (L)  7.0 - 30.0 mg/dL Final   Performed at [facility], [address], [place], [place] [address]   Opiates 09/01/2023 NONE DETECTED  NONE DETECTED Final   Cocaine 09/01/2023 NONE DETECTED  NONE DETECTED Final   Benzodiazepines 09/01/2023 NONE DETECTED  NONE DETECTED Final   Amphetamines 09/01/2023 NONE DETECTED  NONE DETECTED Final   Tetrahydrocannabinol 09/01/2023 POSITIVE (A)  NONE DETECTED Final   Barbiturates 09/01/2023 NONE DETECTED  NONE DETECTED Final   Comment: (NOTE) DRUG SCREEN FOR MEDICAL PURPOSES ONLY.  IF CONFIRMATION IS NEEDED FOR ANY PURPOSE, NOTIFY LAB WITHIN 5 DAYS.  LOWEST DETECTABLE LIMITS FOR URINE DRUG SCREEN Drug Class  Cutoff (ng/mL) Amphetamine and metabolites    1000 Barbiturate and metabolites    200 Benzodiazepine                 200 Opiates and metabolites        300 Cocaine and metabolites        300 THC                            50 Performed at [facility], [address], [place], [place] [address]   Admission on 07/07/2023, Discharged on 07/12/2023  Component Date Value Ref Range Status   Cholesterol 07/07/2023 117  0 - 200 mg/dL Final   Triglycerides 30/86/5784 22  <150 mg/dL Final   HDL 69/62/9528 56  >40 mg/dL Final   Total CHOL/HDL Ratio 07/07/2023 2.1  RATIO Final   VLDL 07/07/2023 4  0 -  40 mg/dL Final   LDL Cholesterol 07/07/2023 57  0 - 99 mg/dL Final   Comment:        Total Cholesterol/HDL:CHD Risk Coronary Heart Disease Risk Table                     Men   Women  1/2 Average Risk   3.4   3.3  Average Risk       5.0   4.4  2 X Average Risk   9.6   7.1  3 X Average Risk  23.4   11.0        Use the calculated Patient Ratio above and the CHD Risk Table to determine the patient's CHD Risk.        ATP III CLASSIFICATION (LDL):  <100     mg/dL   Optimal  413-244  mg/dL   Near or Above                    Optimal  130-159  mg/dL   Borderline  010-272  mg/dL   High  >536     mg/dL   Very High Performed at [facility], [address] [place], [place] [address]    Hgb A1c MFr Bld 07/07/2023 5.1  4.8 - 5.6 % Final   Comment: (NOTE)         Prediabetes: 5.7 - 6.4         Diabetes: >6.4         Glycemic control for adults with diabetes: <7.0    Mean Plasma Glucose 07/07/2023 100  mg/dL Final   Comment: (NOTE) Performed At: [facility] [address] [place], [place] [id number] Jolene Schimke MD OV:[id number]    TSH 07/07/2023 2.114  0.350 - 4.500 uIU/mL Final   Comment: Performed by a 3rd Generation assay with a functional sensitivity of <=0.01 uIU/mL. Performed at [facility], [address] [place], [place] [address]   Admission on 07/06/2023, Discharged on 07/06/2023  Component Date Value Ref Range Status   WBC 07/06/2023 6.6  4.0 - 10.5 K/uL Final   RBC 07/06/2023 5.23  4.22 - 5.81 MIL/uL Final   Hemoglobin 07/06/2023 15.0  13.0 - 17.0 g/dL Final   HCT 01/03/1600 45.4  39.0 - 52.0 % Final   MCV 07/06/2023 86.8  80.0 - 100.0 fL Final   MCH 07/06/2023 28.7  26.0 - 34.0 pg Final   MCHC 07/06/2023 33.0  30.0 - 36.0 g/dL Final   RDW 09/32/3557 11.9  11.5 -  15.5 % Final   Platelets 07/06/2023 236  150 - 400 K/uL Final   nRBC 07/06/2023 0.0  0.0 - 0.2 % Final   Neutrophils Relative % 07/06/2023 59  % Final    Neutro Abs 07/06/2023 3.9  1.7 - 7.7 K/uL Final   Lymphocytes Relative 07/06/2023 27  % Final   Lymphs Abs 07/06/2023 1.8  0.7 - 4.0 K/uL Final   Monocytes Relative 07/06/2023 14  % Final   Monocytes Absolute 07/06/2023 0.9  0.1 - 1.0 K/uL Final   Eosinophils Relative 07/06/2023 0  % Final   Eosinophils Absolute 07/06/2023 0.0  0.0 - 0.5 K/uL Final   Basophils Relative 07/06/2023 0  % Final   Basophils Absolute 07/06/2023 0.0  0.0 - 0.1 K/uL Final   Immature Granulocytes 07/06/2023 0  % Final   Abs Immature Granulocytes 07/06/2023 0.01  0.00 - 0.07 K/uL Final   Performed at [facility], [address] [place], [place] [address]   Sodium 07/06/2023 136  135 - 145 mmol/L Final   Potassium 07/06/2023 3.3 (L)  3.5 - 5.1 mmol/L Final   Chloride 07/06/2023 101  98 - 111 mmol/L Final   CO2 07/06/2023 25  22 - 32 mmol/L Final   Glucose, Bld 07/06/2023 85  70 - 99 mg/dL Final   Glucose reference range applies only to samples taken after fasting for at least 8 hours.   BUN 07/06/2023 7  6 - 20 mg/dL Final   Creatinine, Ser 07/06/2023 1.03  0.61 - 1.24 mg/dL Final   Calcium 96/29/5284 9.6  8.9 - 10.3 mg/dL Final   Total Protein 13/24/4010 7.4  6.5 - 8.1 g/dL Final   Albumin 27/25/3664 4.6  3.5 - 5.0 g/dL Final   AST 40/34/7425 20  15 - 41 U/L Final   ALT 07/06/2023 18  0 - 44 U/L Final   Alkaline Phosphatase 07/06/2023 53  38 - 126 U/L Final   Total Bilirubin 07/06/2023 2.9 (H)  0.0 - 1.2 mg/dL Final   GFR, Estimated 07/06/2023 >60  >60 mL/min Final   Comment: (NOTE) Calculated using the CKD-EPI Creatinine Equation (2021)    Anion gap 07/06/2023 10  5 - 15 Final   Performed at [facility], [address], [place], [place] [address]   Alcohol, Ethyl (B) 07/06/2023 <10  <10 mg/dL Final   Comment: (NOTE) Lowest detectable limit for serum alcohol is 10 mg/dL.  For medical purposes only. Performed at [facility], [address] [place], [place] [address]    TSH  07/06/2023 3.214  0.350 - 4.500 uIU/mL Final   Comment: Performed by a 3rd Generation assay with a functional sensitivity of <=0.01 uIU/mL. Performed at [facility], [address], [place], [place] [address]    POC Amphetamine UR 07/06/2023 None Detected  NONE DETECTED (Cut Off Level 1000 ng/mL) Final   POC Secobarbital (BAR) 07/06/2023 None Detected  NONE DETECTED (Cut Off Level 300 ng/mL) Final   POC Buprenorphine (BUP) 07/06/2023 None Detected  NONE DETECTED (Cut Off Level 10 ng/mL) Final   POC Oxazepam (BZO) 07/06/2023 None Detected  NONE DETECTED (Cut Off Level 300 ng/mL) Final   POC Cocaine UR 07/06/2023 None Detected  NONE DETECTED (Cut Off Level 300 ng/mL) Final   POC Methamphetamine UR 07/06/2023 None Detected  NONE DETECTED (Cut Off Level 1000 ng/mL) Final   POC Morphine 07/06/2023 None Detected  NONE DETECTED (Cut Off Level 300 ng/mL) Final  POC Methadone UR 07/06/2023 None Detected  NONE DETECTED (Cut Off Level 300 ng/mL) Final   POC Oxycodone UR 07/06/2023 None Detected  NONE DETECTED (Cut Off Level 100 ng/mL) Final   POC Marijuana UR 07/06/2023 None Detected  NONE DETECTED (Cut Off Level 50 ng/mL) Final    Blood Alcohol level:  Lab Results  Component Value Date   ETH <10 09/01/2023   ETH <10 07/06/2023    Metabolic Disorder Labs: Lab Results  Component Value Date   HGBA1C 5.1 07/07/2023   MPG 100 07/07/2023   MPG 79.58 08/07/2021   No results found for: "PROLACTIN" Lab Results  Component Value Date   CHOL 117 07/07/2023   TRIG 22 07/07/2023   HDL 56 07/07/2023   CHOLHDL 2.1 07/07/2023   VLDL 4 07/07/2023   LDLCALC 57 07/07/2023   LDLCALC 78 08/07/2021    Therapeutic Lab Levels: No results found for: "LITHIUM" No results found for: "VALPROATE" No results found for: "CBMZ"  Physical Findings   AIMS    Flowsheet Row [facility]  AIMS Total Score 0      AUDIT     Flowsheet Row Ad[facility] (Dis[facility] Admission (Discharged[facility]  Alcohol Use Disorder Identification Test Final Score (AUDIT) 8 0      GAD-7    Flowsheet Row Clinical Support from 09/01/2021 in [facility]  Total GAD-7 Score 16      PHQ2-9    Flowsheet Row ED from 09/02/2023 in [facility] ED from 06/24/2022 in [facility] ED from 10/22/2021 in [facility] Emergency Department at [facility] Counselor from 09/01/2021 in [facility]  PHQ-2 Total Score 5 4 4 4   PHQ-9 Total Score 18 11 17 11       Flowsheet Row ED from 09/02/2023 in [facility] from 09/01/2023 in [facility] Department at [facility] Admission (Discharged) [facility]  C-SSRS RISK CATEGORY Error: Q3, 4, or 5 should not be populated when Q2 is No No Risk High Risk        Musculoskeletal  Strength & Muscle Tone: within normal limits Gait & Station: normal Patient leans: N/A  Psychiatric Specialty Exam  Presentation  General Appearance:  Casual  Eye Contact: Poor  Speech: Clear and Coherent; Slow  Speech Volume: Decreased  Handedness: Right   Mood and Affect  Mood: Depressed; Anxious  Affect: Congruent   Thought Process  Thought Processes: Coherent  Descriptions of Associations:Loose  Orientation:Full (Time, Place and Person)  Thought Content:Paranoid Ideation  Diagnosis of Schizophrenia or Schizoaffective disorder in past: Yes  Duration of Psychotic Symptoms: Greater than six months   Hallucinations:Hallucinations: None  Ideas of Reference:Paranoia  Suicidal Thoughts:Suicidal Thoughts: No  Homicidal Thoughts:Homicidal Thoughts:  No   Sensorium  Memory: Immediate Fair  Judgment: Poor  Insight: Poor   Executive Functions  Concentration: Fair  Attention Span: Fair  Recall: Fair  Fund of Knowledge: Fair  Language: Fair   Psychomotor Activity  Psychomotor Activity: Psychomotor Activity: Normal   Assets  Assets: Communication Skills; Desire for Improvement   Sleep  Sleep: Sleep: Poor   Nutritional Assessment (For OBS and [facility] admissions only) Has the patient had a weight loss or gain of 10 pounds or  more in the last 3 months?: No Has the patient had a decrease in food intake/or appetite?: No Does the patient have dental problems?: No Does the patient have eating habits or behaviors that may be indicators of an eating disorder including binging or inducing vomiting?: No Has the patient recently lost weight without trying?: 0 Has the patient been eating poorly because of a decreased appetite?: 0 Malnutrition Screening Tool Score: 0    Physical Exam  Physical Exam ROS Blood pressure 126/81, pulse 81, temperature 98.1 F (36.7 C), temperature source Oral, resp. rate 16, SpO2 98%. There is no height or weight on file to calculate BMI.  Treatment Plan Summary: Daily contact with patient to assess and evaluate symptoms and progress in treatment, Medication management, and Plan the plan is to restart his home medications.  Continue with detox as indicated.  We will closely monitor his symptoms and assess his depression and consider outpatient rehab. His medications included Seroquel 25 mg at bedtime Trazodone 50 mg at bedtime as needed We will restart Haldol 5 mg twice daily He is also on Haldol decanoate and he was supposed to receive his next dose on 08/10/2023 but is unclear if he received it.   Rex Kras, MD 09/02/2023 10:48 AM

## 2023-09-02 NOTE — Group Note (Signed)
 Group Topic: Positive Affirmations  Group Date: 09/02/2023 Start Time: 1230 End Time: 1300 Facilitators: Jenean Lindau, RN  Department: [facility]  Number of Participants: 8  Group Focus: acceptance and affirmation Treatment Modality:  Behavior Modification Therapy Interventions utilized were patient education Purpose: express feelings, express irrational fears, improve communication skills, increase insight, regain self-worth, and reinforce self-care  Name: Jerry Garrison Date of Birth: 21-Apr-2001  MR: [id number]    Level of Participation: did not attend Quality of Participation:  Interactions with others:  Mood/Affect:  Triggers (if applicable):  Cognition:  Progress:  Response:  Plan:   Patients Problems:  Patient Active Problem List   Diagnosis Date Noted   Methamphetamine use disorder, severe, dependence (HCC) 09/02/2023   Schizophrenia, unspecified (HCC) 01/27/2022   Tobacco use disorder 01/27/2022   Cannabis use disorder 01/27/2022   Accidental drug overdose    Overdose of opiate or related narcotic, accidental or unintentional, initial encounter (HCC) 01/25/2022   Acute respiratory failure with hypoxia (HCC) 01/25/2022   Aspiration pneumonia (HCC) 01/25/2022   Transaminitis 01/25/2022   Amphetamine use disorder, severe (HCC)    Schizo affective schizophrenia (HCC) 09/01/2021   Schizophrenia (HCC) 08/07/2021   Brief psychotic disorder (HCC) 01/06/2021   Threatening to others 12/12/2020

## 2023-09-02 NOTE — Group Note (Signed)
 Group Topic: Spirituality in Recovery  Group Date: 09/02/2023 Start Time: 0130 End Time: 0300 Facilitators: Loleta Dicker, LCSW  Department: [facility]  Number of Participants: 7  Group Focus: check in, clarity of thought, coping skills, daily focus, feeling awareness/expression, impulsivity, personal responsibility, relapse prevention, self-awareness, and self-esteem Treatment Modality:  Behavior Modification Therapy and Cognitive Behavioral Therapy Interventions utilized were assignment, clarification, exploration, group exercise, mental fitness, story telling, and support Purpose: enhance coping skills, explore maladaptive thinking, express feelings, improve communication skills, increase insight, regain self-worth, reinforce self-care, relapse prevention strategies, and trigger / craving management  Name: Jerry Garrison Date of Birth: [birth year]  MR: [id number]    Level of Participation: active Quality of Participation: attentive and cooperative Interactions with others: gave feedback Mood/Affect: appropriate  Triggers (if applicable): N/A Cognition: coherent/clear Progress: Gaining insight Description of Group:   This group will address the importance of considering the journey and not just the destination. Patients will be encouraged to process areas in their lives where they found it hard to focus on the good rather than the bad, and identify reasons for maintaining such thought pattern. Facilitator will guide patients utilizing problem- solving interventions to address and improve the way each patient views themselves and their situation. Patients will work through understanding and applying humility when it comes to life changes and the interactions we have with others. Patients will be encouraged to explore ways that they will move forward throughout life's journey, and make healthier decisions for themselves.   Therapeutic Goals: Patient will identify  two or more emotions or situations that they observed or felt throughout watching the video clip. Patient will identify signs where they may have responded to someone or situation due to their current circumstance.  Patient will identify two ways to set better habits in order to achieve more peace in their lives. Patient will demonstrate ability to communicate their needs through discussion and/or role plays.  Summary of Patient Progress: Patient actively participated in group on today. Patient shared what resonated with him. Patient reports needing to be aware of how he treats others because he never knows what the other person may be encountering. Patient reports he enjoyed the video and it gave him different perspectives to consider.  Plan: referral / recommendations  Patients Problems:  Patient Active Problem List   Diagnosis Date Noted   Methamphetamine use disorder, severe, dependence (HCC) 09/02/2023   Schizophrenia, unspecified (HCC) 01/27/2022   Tobacco use disorder 01/27/2022   Cannabis use disorder 01/27/2022   Accidental drug overdose    Overdose of opiate or related narcotic, accidental or unintentional, initial encounter (HCC) 01/25/2022   Acute respiratory failure with hypoxia (HCC) 01/25/2022   Aspiration pneumonia (HCC) 01/25/2022   Transaminitis 01/25/2022   Amphetamine use disorder, severe (HCC)    Schizo affective schizophrenia (HCC) 09/01/2021   Schizophrenia (HCC) 08/07/2021   Brief psychotic disorder (HCC) 01/06/2021   Threatening to others 12/12/2020

## 2023-09-03 DIAGNOSIS — F1510 Other stimulant abuse, uncomplicated: Secondary | ICD-10-CM | POA: Diagnosis not present

## 2023-09-03 MED ORDER — SERTRALINE HCL 50 MG PO TABS
50.0000 mg | ORAL_TABLET | Freq: Every day | ORAL | Status: DC
  Administered 2023-09-03 – 2023-09-10 (×8): 50 mg via ORAL
  Filled 2023-09-03 (×8): qty 1

## 2023-09-03 NOTE — ED Notes (Signed)
 Patient calm,guarded on approach. Sad affect keeps his head down. Denies si/hi/avh. He is medication adherent. Denies s/s of withdrawal. Observed attending group. Appetite is fair and he offers no concerns at this time. Will continue to monitor and report changes as noted.

## 2023-09-03 NOTE — ED Provider Notes (Signed)
 Behavioral Health Progress Note  Date and Time: 09/03/2023 9:43 AM Name: Jerry Garrison MRN:  [id number]  Reason for admission   The patient is a 23 year old male the patient is a 23 year old male with type prior psychiatric history of schizophrenia was in schizoaffective disorder and methamphetamine abuse who arrived at the [facility] a [facility] with complaints of paranoia and methamphetamine abuse.  Patient is seeking detox. He was seen and evaluated and admitted yesterday.  When seen today the patient reports that he has had at least 5 ED visits and 1 inpatient psychiatric hospitalization in the past 6 months.  He was recently admitted to [facility] between 07/07/2023 to 07/12/2023 for worsening psychosis.  Chart review from last 24 hours   The patient was seen today and the chart was reviewed and the case was discussed with the treatment team.  According to the staff the patient has been cooperative and compliant with treatment.  He has been withdrawn and still has hesitancy and some thought blocking. Additional psychosocial information was obtained from the patient's mother.  Apparently the patient has been going through a significant auto depression after loss of his girlfriend who left to go to [place] with his daughter.  According to the mother the patient was diagnosed with schizophrenic psychosis about 3 years ago.  Apparently has not been stable and mother is very supportive and would like him to get some treatment for his depression and psychosis and his addiction.  In the past she has been found at a parking deck threatening to jump off prior to the previous admission. He has been using marijuana on a daily basis.  Recent admission to [facility] with psychosis.  He is unclear if patient received Haldol decanoate injection.  Yesterday, the psychiatry team made the following recommendations:  Increase Haldol to 5 mg p.o. twice daily Begin Zoloft 50 mg a day Continue with close  monitoring of his symptoms  Information obtained during interview     Diagnosis: Schizoaffective disorder Methamphetamine abuse Polysubstance abuse Medication noncompliance Final diagnoses:  Methamphetamine abuse (HCC)  Polysubstance abuse (HCC)  Substance induced mood disorder (HCC)  Non compliance w medication regimen    Total Time spent with patient: 30 minutes  Past Psychiatric History: Past psychiatric history significant for history of at least 1 hospitalization to [facility] for psychosis.  He also has a history of methamphetamine and marijuana abuse. Past Medical History: None noted Family History: Unknown Family Psychiatric  History: Unknown Social History: Patient is single and reports that he completed high school.  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  Additional Social History:                         Sleep: Fair  Appetite:  Fair  Current Medications:  Current Facility-Administered Medications  Medication Dose Route Frequency Provider Last Rate Last Admin   acetaminophen (TYLENOL) tablet 650 mg  650 mg Oral Q6H PRN Onuoha, Chinwendu V, NP       alum & mag hydroxide-simeth (MAALOX/MYLANTA) 200-200-20 MG/5ML suspension 30 mL  30 mL Oral Q4H PRN Onuoha, Chinwendu V, NP       cloNIDine (CATAPRES) tablet 0.1 mg  0.1 mg Oral QID Onuoha, Chinwendu V, NP   0.1 mg at 09/03/23 0925   Followed by   Melene Muller ON 09/04/2023] cloNIDine (CATAPRES) tablet 0.1 mg  0.1 mg Oral  BH-qamhs Onuoha, Chinwendu V, NP       Followed by   Melene Muller ON 09/06/2023] cloNIDine (CATAPRES) tablet 0.1 mg  0.1 mg Oral QAC breakfast Onuoha, Chinwendu V, NP       dicyclomine (BENTYL) tablet 20 mg  20 mg Oral Q6H PRN Onuoha, Chinwendu V, NP       haloperidol (HALDOL) tablet 5 mg  5 mg Oral TID PRN Onuoha, Chinwendu V, NP       And   diphenhydrAMINE (BENADRYL) capsule 50 mg  50 mg Oral TID PRN Onuoha, Chinwendu V, NP   50 mg at 09/02/23 0255   haloperidol  lactate (HALDOL) injection 5 mg  5 mg Intramuscular TID PRN Onuoha, Chinwendu V, NP       And   diphenhydrAMINE (BENADRYL) injection 50 mg  50 mg Intramuscular TID PRN Onuoha, Chinwendu V, NP       And   LORazepam (ATIVAN) injection 2 mg  2 mg Intramuscular TID PRN Onuoha, Chinwendu V, NP       haloperidol lactate (HALDOL) injection 10 mg  10 mg Intramuscular TID PRN Onuoha, Chinwendu V, NP       And   diphenhydrAMINE (BENADRYL) injection 50 mg  50 mg Intramuscular TID PRN Onuoha, Chinwendu V, NP       And   LORazepam (ATIVAN) injection 2 mg  2 mg Intramuscular TID PRN Onuoha, Chinwendu V, NP       haloperidol (HALDOL) tablet 5 mg  5 mg Oral BID Rex Kras, MD   5 mg at 09/03/23 0865   hydrOXYzine (ATARAX) tablet 25 mg  25 mg Oral Q6H PRN Onuoha, Chinwendu V, NP       loperamide (IMODIUM) capsule 2-4 mg  2-4 mg Oral PRN Onuoha, Chinwendu V, NP       magnesium hydroxide (MILK OF MAGNESIA) suspension 30 mL  30 mL Oral Daily PRN Onuoha, Chinwendu V, NP       methocarbamol (ROBAXIN) tablet 500 mg  500 mg Oral Q8H PRN Onuoha, Chinwendu V, NP       ondansetron (ZOFRAN-ODT) disintegrating tablet 4 mg  4 mg Oral Q6H PRN Onuoha, Chinwendu V, NP       oxymetazoline (AFRIN) 0.05 % nasal spray 1 spray  1 spray Each Nare BID PRN Onuoha, Chinwendu V, NP       sertraline (ZOLOFT) tablet 50 mg  50 mg Oral Daily Rex Kras, MD       traZODone (DESYREL) tablet 50 mg  50 mg Oral QHS PRN Onuoha, Chinwendu V, NP       Current Outpatient Medications  Medication Sig Dispense Refill   haloperidol (HALDOL) 5 MG tablet Take 1 tablet (5 mg total) by mouth at bedtime for 10 days. 10 tablet 0   haloperidol decanoate (HALDOL DECANOATE) 100 MG/ML injection Inject 0.5 mLs (50 mg total) into the muscle every 30 (thirty) days for 1 dose. Next dose is due to be administerred on 08-10-23. 0.5 mL 0    Labs  Lab Results:  Admission on 09/01/2023, Discharged on 09/02/2023  Component Date Value Ref Range Status    WBC 09/01/2023 6.0  4.0 - 10.5 K/uL Final   RBC 09/01/2023 4.45  4.22 - 5.81 MIL/uL Final   Hemoglobin 09/01/2023 13.0  13.0 - 17.0 g/dL Final   HCT 78/46/9629 39.3  39.0 - 52.0 % Final   MCV 09/01/2023 88.3  80.0 - 100.0 fL Final   MCH 09/01/2023 29.2  26.0 - 34.0 pg Final  MCHC 09/01/2023 33.1  30.0 - 36.0 g/dL Final   RDW 16/04/9603 12.1  11.5 - 15.5 % Final   Platelets 09/01/2023 207  150 - 400 K/uL Final   nRBC 09/01/2023 0.0  0.0 - 0.2 % Final   Neutrophils Relative % 09/01/2023 66  % Final   Neutro Abs 09/01/2023 4.0  1.7 - 7.7 K/uL Final   Lymphocytes Relative 09/01/2023 14  % Final   Lymphs Abs 09/01/2023 0.8  0.7 - 4.0 K/uL Final   Monocytes Relative 09/01/2023 18  % Final   Monocytes Absolute 09/01/2023 1.1 (H)  0.1 - 1.0 K/uL Final   Eosinophils Relative 09/01/2023 1  % Final   Eosinophils Absolute 09/01/2023 0.1  0.0 - 0.5 K/uL Final   Basophils Relative 09/01/2023 1  % Final   Basophils Absolute 09/01/2023 0.0  0.0 - 0.1 K/uL Final   Immature Granulocytes 09/01/2023 0  % Final   Abs Immature Granulocytes 09/01/2023 0.02  0.00 - 0.07 K/uL Final   Performed at [facility], [address], [place], [place] [address]   Sodium 09/01/2023 134 (L)  135 - 145 mmol/L Final   Potassium 09/01/2023 3.5  3.5 - 5.1 mmol/L Final   Chloride 09/01/2023 101  98 - 111 mmol/L Final   CO2 09/01/2023 22  22 - 32 mmol/L Final   Glucose, Bld 09/01/2023 102 (H)  70 - 99 mg/dL Final   Glucose reference range applies only to samples taken after fasting for at least 8 hours.   BUN 09/01/2023 16  6 - 20 mg/dL Final   Creatinine, Ser 09/01/2023 0.92  0.61 - 1.24 mg/dL Final   Calcium 11/91/4782 8.8 (L)  8.9 - 10.3 mg/dL Final   Total Protein 95/62/1308 7.2  6.5 - 8.1 g/dL Final   Albumin 65/78/4696 4.3  3.5 - 5.0 g/dL Final   AST 29/52/8413 19  15 - 41 U/L Final   ALT 09/01/2023 14  0 - 44 U/L Final   Alkaline Phosphatase 09/01/2023 49  38 - 126 U/L Final   Total Bilirubin  09/01/2023 2.2 (H)  0.0 - 1.2 mg/dL Final   GFR, Estimated 09/01/2023 >60  >60 mL/min Final   Comment: (NOTE) Calculated using the CKD-EPI Creatinine Equation (2021)    Anion gap 09/01/2023 11  5 - 15 Final   Performed at [facility], [address] [place], [place] [address]   Alcohol, Ethyl (B) 09/01/2023 <10  <10 mg/dL Final   Comment: (NOTE) Lowest detectable limit for serum alcohol is 10 mg/dL.  For medical purposes only. Performed at [facility], [address], [place], [place] [address]    Acetaminophen (Tylenol), Serum 09/01/2023 <10 (L)  10 - 30 ug/mL Final   Comment: (NOTE) Therapeutic concentrations vary significantly. A range of 10-30 ug/mL  may be an effective concentration for many patients. However, some  are best treated at concentrations outside of this range. Acetaminophen concentrations >150 ug/mL at 4 hours after ingestion  and >50 ug/mL at 12 hours after ingestion are often associated with  toxic reactions.  Performed at [facility], [address], [place], [place] [address]    Salicylate Lvl 09/01/2023 <7.0 (L)  7.0 - 30.0 mg/dL Final   Performed at [facility], [address] [place], [place] [address]   Opiates 09/01/2023 NONE DETECTED  NONE DETECTED Final   Cocaine 09/01/2023 NONE DETECTED  NONE DETECTED Final   Benzodiazepines 09/01/2023 NONE DETECTED  NONE DETECTED Final   Amphetamines 09/01/2023 NONE DETECTED  NONE DETECTED Final   Tetrahydrocannabinol 09/01/2023 POSITIVE (A)  NONE DETECTED Final   Barbiturates 09/01/2023 NONE DETECTED  NONE DETECTED Final   Comment: (NOTE) DRUG SCREEN FOR MEDICAL PURPOSES ONLY.  IF CONFIRMATION IS NEEDED FOR ANY PURPOSE, NOTIFY LAB WITHIN 5 DAYS.  LOWEST DETECTABLE LIMITS FOR URINE DRUG SCREEN Drug Class                     Cutoff (ng/mL) Amphetamine and metabolites    1000 Barbiturate and metabolites    200 Benzodiazepine                  200 Opiates and metabolites        300 Cocaine and metabolites        300 THC                            50 Performed at [facility], [address], [place], [place] [address]   Admission on 07/07/2023, Discharged on 07/12/2023  Component Date Value Ref Range Status   Cholesterol 07/07/2023 117  0 - 200 mg/dL Final   Triglycerides 13/24/4010 22  <150 mg/dL Final   HDL 27/25/3664 56  >40 mg/dL Final   Total CHOL/HDL Ratio 07/07/2023 2.1  RATIO Final   VLDL 07/07/2023 4  0 - 40 mg/dL Final   LDL Cholesterol 07/07/2023 57  0 - 99 mg/dL Final   Comment:        Total Cholesterol/HDL:CHD Risk Coronary Heart Disease Risk Table                     Men   Women  1/2 Average Risk   3.4   3.3  Average Risk       5.0   4.4  2 X Average Risk   9.6   7.1  3 X Average Risk  23.4   11.0        Use the calculated Patient Ratio above and the CHD Risk Table to determine the patient's CHD Risk.        ATP III CLASSIFICATION (LDL):  <100     mg/dL   Optimal  403-474  mg/dL   Near or Above                    Optimal  130-159  mg/dL   Borderline  259-563  mg/dL   High  >875     mg/dL   Very High Performed at [facility], [address] [place], [place] [address]    Hgb A1c MFr Bld 07/07/2023 5.1  4.8 - 5.6 % Final   Comment: (NOTE)         Prediabetes: 5.7 - 6.4         Diabetes: >6.4         Glycemic control for adults with diabetes: <7.0    Mean Plasma Glucose 07/07/2023 100  mg/dL Final   Comment: (NOTE) Performed At: [facility] [address] [place], [place] [id number] Jolene Schimke MD AY:[id number]    TSH 07/07/2023 2.114  0.350 - 4.500 uIU/mL Final   Comment: Performed by a 3rd Generation assay with a functional sensitivity of <=0.01 uIU/mL. Performed at [facility], [address] [place], [place] [address]   Admission on 07/06/2023, Discharged on 07/06/2023  Component Date Value Ref  Range  Status   WBC 07/06/2023 6.6  4.0 - 10.5 K/uL Final   RBC 07/06/2023 5.23  4.22 - 5.81 MIL/uL Final   Hemoglobin 07/06/2023 15.0  13.0 - 17.0 g/dL Final   HCT 16/04/9603 45.4  39.0 - 52.0 % Final   MCV 07/06/2023 86.8  80.0 - 100.0 fL Final   MCH 07/06/2023 28.7  26.0 - 34.0 pg Final   MCHC 07/06/2023 33.0  30.0 - 36.0 g/dL Final   RDW 54/03/8118 11.9  11.5 - 15.5 % Final   Platelets 07/06/2023 236  150 - 400 K/uL Final   nRBC 07/06/2023 0.0  0.0 - 0.2 % Final   Neutrophils Relative % 07/06/2023 59  % Final   Neutro Abs 07/06/2023 3.9  1.7 - 7.7 K/uL Final   Lymphocytes Relative 07/06/2023 27  % Final   Lymphs Abs 07/06/2023 1.8  0.7 - 4.0 K/uL Final   Monocytes Relative 07/06/2023 14  % Final   Monocytes Absolute 07/06/2023 0.9  0.1 - 1.0 K/uL Final   Eosinophils Relative 07/06/2023 0  % Final   Eosinophils Absolute 07/06/2023 0.0  0.0 - 0.5 K/uL Final   Basophils Relative 07/06/2023 0  % Final   Basophils Absolute 07/06/2023 0.0  0.0 - 0.1 K/uL Final   Immature Granulocytes 07/06/2023 0  % Final   Abs Immature Granulocytes 07/06/2023 0.01  0.00 - 0.07 K/uL Final   Performed at [facility], [address], [place], [place] [address]   Sodium 07/06/2023 136  135 - 145 mmol/L Final   Potassium 07/06/2023 3.3 (L)  3.5 - 5.1 mmol/L Final   Chloride 07/06/2023 101  98 - 111 mmol/L Final   CO2 07/06/2023 25  22 - 32 mmol/L Final   Glucose, Bld 07/06/2023 85  70 - 99 mg/dL Final   Glucose reference range applies only to samples taken after fasting for at least 8 hours.   BUN 07/06/2023 7  6 - 20 mg/dL Final   Creatinine, Ser 07/06/2023 1.03  0.61 - 1.24 mg/dL Final   Calcium 95/62/1308 9.6  8.9 - 10.3 mg/dL Final   Total Protein 65/78/4696 7.4  6.5 - 8.1 g/dL Final   Albumin 29/52/8413 4.6  3.5 - 5.0 g/dL Final   AST 24/40/1027 20  15 - 41 U/L Final   ALT 07/06/2023 18  0 - 44 U/L Final   Alkaline Phosphatase 07/06/2023 53  38 - 126 U/L Final   Total Bilirubin 07/06/2023 2.9 (H)   0.0 - 1.2 mg/dL Final   GFR, Estimated 07/06/2023 >60  >60 mL/min Final   Comment: (NOTE) Calculated using the CKD-EPI Creatinine Equation (2021)    Anion gap 07/06/2023 10  5 - 15 Final   Performed at [facility], [address], [place], [place] [address]   Alcohol, Ethyl (B) 07/06/2023 <10  <10 mg/dL Final   Comment: (NOTE) Lowest detectable limit for serum alcohol is 10 mg/dL.  For medical purposes only. Performed at [facility], [address] [place], [place] [address]    TSH 07/06/2023 3.214  0.350 - 4.500 uIU/mL Final   Comment: Performed by a 3rd Generation assay with a functional sensitivity of <=0.01 uIU/mL. Performed at [facility], [address] [place], [place] [address]    POC Amphetamine UR 07/06/2023 None Detected  NONE DETECTED (Cut Off Level 1000 ng/mL) Final   POC Secobarbital (BAR) 07/06/2023 None Detected  NONE DETECTED (Cut Off Level 300 ng/mL)  Final   POC Buprenorphine (BUP) 07/06/2023 None Detected  NONE DETECTED (Cut Off Level 10 ng/mL) Final   POC Oxazepam (BZO) 07/06/2023 None Detected  NONE DETECTED (Cut Off Level 300 ng/mL) Final   POC Cocaine UR 07/06/2023 None Detected  NONE DETECTED (Cut Off Level 300 ng/mL) Final   POC Methamphetamine UR 07/06/2023 None Detected  NONE DETECTED (Cut Off Level 1000 ng/mL) Final   POC Morphine 07/06/2023 None Detected  NONE DETECTED (Cut Off Level 300 ng/mL) Final   POC Methadone UR 07/06/2023 None Detected  NONE DETECTED (Cut Off Level 300 ng/mL) Final   POC Oxycodone UR 07/06/2023 None Detected  NONE DETECTED (Cut Off Level 100 ng/mL) Final   POC Marijuana UR 07/06/2023 None Detected  NONE DETECTED (Cut Off Level 50 ng/mL) Final    Blood Alcohol level:  Lab Results  Component Value Date   ETH <10 09/01/2023   ETH <10 07/06/2023    Metabolic Disorder Labs: Lab Results  Component Value Date   HGBA1C 5.1 07/07/2023   MPG 100 07/07/2023   MPG 79.58 08/07/2021   No results found for:  "PROLACTIN" Lab Results  Component Value Date   CHOL 117 07/07/2023   TRIG 22 07/07/2023   HDL 56 07/07/2023   CHOLHDL 2.1 07/07/2023   VLDL 4 07/07/2023   LDLCALC 57 07/07/2023   LDLCALC 78 08/07/2021    Therapeutic Lab Levels: No results found for: "LITHIUM" No results found for: "VALPROATE" No results found for: "CBMZ"  Physical Findings   AIMS    Flowsheet Row [facility]  AIMS Total Score 0      AUDIT    Flowsheet Row Ad[facility] (Discharged[facility] Admission (Discharged[facility]  Alcohol Use Disorder Identification Test Final Score (AUDIT) 8 0      GAD-7    Flowsheet Row Clinical Support from 09/01/2021 in [facility]  Total GAD-7 Score 16      PHQ2-9    Flowsheet Row ED from 09/02/2023 in [facility] ED from 06/24/2022 in [facility] ED from 10/22/2021 in [facility] Emergency Department at [facility] Counselor from 09/01/2021 in [place]  PHQ-2 Total Score 5 4 4 4   PHQ-9 Total Score 18 11 17 11       Flowsheet Row ED from 09/02/2023 in [facility] ED from 09/01/2023 in [facility] Emergency Department at [facility] Admission (Discharged) [facility]  C-SSRS RISK CATEGORY No Risk No Risk High Risk        Musculoskeletal  Strength & Muscle Tone: within normal limits Gait & Station: normal Patient leans: N/A  Psychiatric Specialty Exam  Presentation  General Appearance:  Casual  Eye Contact: Poor  Speech: Clear and Coherent; Slow  Speech Volume: Decreased  Handedness: Right   Mood and Affect  Mood: Depressed;  Anxious  Affect: Congruent   Thought Process  Thought Processes: Coherent  Descriptions of Associations:Loose  Orientation:Full (Time, Place and Person)  Thought Content:Paranoid Ideation  Diagnosis of Schizophrenia or Schizoaffective disorder in past: Yes  Duration of Psychotic Symptoms: Greater than six months   Hallucinations:Hallucinations: None  Ideas of Reference:Paranoia  Suicidal Thoughts:Suicidal Thoughts: No  Homicidal Thoughts:Homicidal Thoughts: No   Sensorium  Memory: Immediate Fair  Judgment:  Poor  Insight: Poor   Art therapist  Concentration: Fair  Attention Span: Fair  Recall: [employer] of Knowledge: Fair  Language: Fair   Psychomotor Activity  Psychomotor Activity: Psychomotor Activity: Normal   Assets  Assets: Manufacturing systems engineer; Desire for Improvement   Sleep  Sleep: Sleep: Poor   Nutritional Assessment (For OBS and FBC admissions only) Has the patient had a weight loss or gain of 10 pounds or more in the last 3 months?: No Has the patient had a decrease in food intake/or appetite?: No Does the patient have dental problems?: No Does the patient have eating habits or behaviors that may be indicators of an eating disorder including binging or inducing vomiting?: No Has the patient recently lost weight without trying?: 0 Has the patient been eating poorly because of a decreased appetite?: 0 Malnutrition Screening Tool Score: 0    Physical Exam  Physical Exam ROS Blood pressure 111/69, pulse 82, temperature 98.4 F (36.9 C), temperature source Oral, resp. rate 16, SpO2 99%. There is no height or weight on file to calculate BMI.  Treatment Plan Summary: Daily contact with patient to assess and evaluate symptoms and progress in treatment, Medication management, and Plan the plan is to restart his home medications.  Continue with detox as indicated.  We will closely monitor his symptoms and assess his  depression and consider outpatient rehab. His medications included Seroquel 25 mg at bedtime Trazodone 50 mg at bedtime as needed We will restart Haldol 5 mg twice daily He is also on Haldol decanoate and he was supposed to receive his next dose on 08/10/2023 but is unclear if he received it. Begin Zoloft 50 mg a day  Rex Kras, MD 09/03/2023 9:43 AM

## 2023-09-03 NOTE — [facility] Group Notes (Signed)
 SPIRITUALITY GROUP NOTE  Spirituality group facilitated by Wilkie Aye, MDiv, [facility].  Group Description: Group focused on topic of hope. Patients participated in facilitated discussion around topic, connecting with one another around experiences and definitions for hope. Group members engaged with visual explorer photos, reflecting on what hope looks like for them today. Group engaged in discussion around how their definitions of hope are present today in hospital.  Modalities: Psycho-social ed, Adlerian, Narrative, MI  Patient Progress: DID NOT ATTEND

## 2023-09-03 NOTE — ED Notes (Signed)
 Patient is resting in bed with eyes closed without any distress noted with even unlabored breathing. Staff will continue to monitor safety and for changes in condition.

## 2023-09-03 NOTE — ED Notes (Signed)
 Patient seen sitting in the dining area watching TV without any distress. He is calm, cooperative, and very polite and respectful. He denies pain, anxiety/depression, and SIHI and AVH. He denies having withdrawal symptoms and reports sleeping good without having night mares. Staff will continue to monitor safety and for changes in condition.

## 2023-09-03 NOTE — Group Note (Signed)
 Group Topic: Decisional Balance/Substance Abuse  Group Date: 09/03/2023 Start Time: 1200 End Time: 1230 Facilitators: Concha Norway, NT  Department: [facility]  Number of Participants: 5  Group Focus: acceptance Treatment Modality:  Behavior Modification Therapy Interventions utilized were exploration Purpose: enhance coping skills and explore maladaptive thinking  Name: Jerry Garrison Date of Birth: 2001-02-12  MR: [id number]    Level of Participation: moderate Quality of Participation: attentive Interactions with others: gave feedback Mood/Affect: appropriate Triggers (if applicable):  Cognition: concrete Progress: Significant Response:  Plan: follow-up needed  Patients Problems:  Patient Active Problem List   Diagnosis Date Noted   Methamphetamine use disorder, severe, dependence (HCC) 09/02/2023   Schizophrenia, unspecified (HCC) 01/27/2022   Tobacco use disorder 01/27/2022   Cannabis use disorder 01/27/2022   Accidental drug overdose    Overdose of opiate or related narcotic, accidental or unintentional, initial encounter (HCC) 01/25/2022   Acute respiratory failure with hypoxia (HCC) 01/25/2022   Aspiration pneumonia (HCC) 01/25/2022   Transaminitis 01/25/2022   Amphetamine use disorder, severe (HCC)    Schizo affective schizophrenia (HCC) 09/01/2021   Schizophrenia (HCC) 08/07/2021   Brief psychotic disorder (HCC) 01/06/2021   Threatening to others 12/12/2020

## 2023-09-03 NOTE — ED Notes (Signed)
 Patient in the bedroom calm and sleeping. NAD. Respirations are even and unlabored. Will continue to monitor for safety.

## 2023-09-03 NOTE — Group Note (Signed)
 Group Topic: Wellness  Group Date: 09/03/2023 Start Time: 0130 End Time: 1353 Facilitators: Salina April, RN  Department: [facility]  Number of Participants: 6  Group Focus: nursing group Treatment Modality:  Leisure Development Interventions utilized were exploration Purpose: reinforce self-care  Name: Jerry Garrison Date of Birth: 02-15-2001  MR: [id number]    Level of Participation: moderate Quality of Participation: cooperative and engaged Interactions with others: gave feedback Mood/Affect: appropriate and brightens with interaction Triggers (if applicable): na Cognition: coherent/clear and goal directed Progress: Gaining insight Response: " I liked doing this group outside. I am ready to do this for me and my son" Plan: patient will be encouraged to continue to explore positive affirmations to help avoid risky behavior and help guide him to maintain medication adherence.  Patients Problems:  Patient Active Problem List   Diagnosis Date Noted   Methamphetamine use disorder, severe, dependence (HCC) 09/02/2023   Schizophrenia, unspecified (HCC) 01/27/2022   Tobacco use disorder 01/27/2022   Cannabis use disorder 01/27/2022   Accidental drug overdose    Overdose of opiate or related narcotic, accidental or unintentional, initial encounter (HCC) 01/25/2022   Acute respiratory failure with hypoxia (HCC) 01/25/2022   Aspiration pneumonia (HCC) 01/25/2022   Transaminitis 01/25/2022   Amphetamine use disorder, severe (HCC)    Schizo affective schizophrenia (HCC) 09/01/2021   Schizophrenia (HCC) 08/07/2021   Brief psychotic disorder (HCC) 01/06/2021   Threatening to others 12/12/2020

## 2023-09-03 NOTE — Discharge Planning (Signed)
 Referral has been sent to the following facilities for review:  [facility]  Updates will be provided once received.   Fernande Boyden, LCSW Clinical Social Worker [facility] Ph: [phone]

## 2023-09-04 DIAGNOSIS — F1994 Other psychoactive substance use, unspecified with psychoactive substance-induced mood disorder: Secondary | ICD-10-CM | POA: Diagnosis not present

## 2023-09-04 DIAGNOSIS — F1510 Other stimulant abuse, uncomplicated: Secondary | ICD-10-CM | POA: Diagnosis not present

## 2023-09-04 DIAGNOSIS — Z91148 Patient's other noncompliance with medication regimen for other reason: Secondary | ICD-10-CM | POA: Diagnosis not present

## 2023-09-04 MED ORDER — HALOPERIDOL DECANOATE 100 MG/ML IM SOLN
50.0000 mg | INTRAMUSCULAR | Status: DC
  Administered 2023-09-04: 50 mg via INTRAMUSCULAR
  Filled 2023-09-04: qty 1

## 2023-09-04 NOTE — ED Provider Notes (Signed)
 Behavioral Health Progress Note  Date and Time: 09/04/2023 11:53 AM Name: Jerry Garrison MRN:  [id number]  Reason for admission   The patient is a 23 year old male the patient is a 23 year old male with type prior psychiatric history of schizophrenia was in schizoaffective disorder and methamphetamine abuse who arrived [facility] the [facility] a [facility] with complaints of paranoia and methamphetamine abuse.  Patient is seeking detox. He was seen and evaluated and admitted yesterday.  When seen today the patient reports that he has had at least 5 ED visits and 1 inpatient psychiatric hospitalization in the past 6 months.  He was recently admitted to [facility] between 07/07/2023 to 07/12/2023 for worsening psychosis.  Chart review from last 24 hours   The patient was seen today and the chart was reviewed and the case was discussed with nursing staff and the treatment team.  He has been somewhat withdrawn to his room and isolate to but has been attending groups and has also been taking his medications as prescribed.  No behavioral issues were noted today.  Yesterday, the psychiatry team made the following recommendations:  Haldol to 5 mg p.o. twice daily Zoloft 50 mg a day Continue with close monitoring of his symptoms  Information obtained during interview   The patient was examined today and he was noted to be alert oriented and cooperative.  He was a lot more verbal than he was in the last 2 days.  He maintained fair eye contact.  His speech was coherent without any obvious looseness of associations or flight of ideas.  When he was first admitted he had significant hesitancy and mild thought blocking.  He does endorse some depressive symptoms and reports that he has some conflicts with his mother that trigger his frustration but he denies any active suicidal homicidal ideations.  He also misses his son who apparently is now in [place] with his son's mother who moved from [place] to  [place].  He had only seen his son for a few months after he was born and then moved to [place].  He does miss them but states that he has somewhat reconciled with feelings.  Today is contracting for safety and understands that he needs to be on the Haldol decanoate monthly which he has not received the injection this month.  He is willing to take the long-acting injectable and will be prescribed that today.  Diagnosis: Schizoaffective disorder Methamphetamine abuse Polysubstance abuse Medication noncompliance Final diagnoses:  Methamphetamine abuse (HCC)  Polysubstance abuse (HCC)  Substance induced mood disorder (HCC)  Non compliance w medication regimen    Total Time spent with patient: 30 minutes  Past Psychiatric History: Past psychiatric history significant for history of at least 1 hospitalization to [facility] for psychosis.  He also has a history of methamphetamine and marijuana abuse. Past Medical History: None noted Family History: Unknown Family Psychiatric  History: Unknown Social History: Patient is single and reports that he completed high school.  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  Additional Social History:                         Sleep: Fair  Appetite:  Fair  Current Medications:  Current Facility-Administered Medications  Medication Dose Route Frequency Provider Last Rate Last Admin   acetaminophen (TYLENOL) tablet 650 mg  650 mg Oral Q6H PRN Onuoha,  Chinwendu V, NP       alum & mag hydroxide-simeth (MAALOX/MYLANTA) 200-200-20 MG/5ML suspension 30 mL  30 mL Oral Q4H PRN Onuoha, Chinwendu V, NP       cloNIDine (CATAPRES) tablet 0.1 mg  0.1 mg Oral BH-qamhs Onuoha, Chinwendu V, NP   0.1 mg at 09/04/23 1054   Followed by   Melene Muller ON 09/06/2023] cloNIDine (CATAPRES) tablet 0.1 mg  0.1 mg Oral QAC breakfast Onuoha, Chinwendu V, NP       dicyclomine (BENTYL) tablet 20 mg  20 mg Oral Q6H PRN Onuoha, Chinwendu V,  NP       haloperidol (HALDOL) tablet 5 mg  5 mg Oral TID PRN Onuoha, Chinwendu V, NP       And   diphenhydrAMINE (BENADRYL) capsule 50 mg  50 mg Oral TID PRN Onuoha, Chinwendu V, NP   50 mg at 09/02/23 0255   haloperidol lactate (HALDOL) injection 5 mg  5 mg Intramuscular TID PRN Onuoha, Chinwendu V, NP       And   diphenhydrAMINE (BENADRYL) injection 50 mg  50 mg Intramuscular TID PRN Onuoha, Chinwendu V, NP       And   LORazepam (ATIVAN) injection 2 mg  2 mg Intramuscular TID PRN Onuoha, Chinwendu V, NP       haloperidol lactate (HALDOL) injection 10 mg  10 mg Intramuscular TID PRN Onuoha, Chinwendu V, NP       And   diphenhydrAMINE (BENADRYL) injection 50 mg  50 mg Intramuscular TID PRN Onuoha, Chinwendu V, NP       And   LORazepam (ATIVAN) injection 2 mg  2 mg Intramuscular TID PRN Onuoha, Chinwendu V, NP       haloperidol (HALDOL) tablet 5 mg  5 mg Oral BID Rex Kras, MD   5 mg at 09/04/23 1052   hydrOXYzine (ATARAX) tablet 25 mg  25 mg Oral Q6H PRN Onuoha, Chinwendu V, NP       loperamide (IMODIUM) capsule 2-4 mg  2-4 mg Oral PRN Onuoha, Chinwendu V, NP       magnesium hydroxide (MILK OF MAGNESIA) suspension 30 mL  30 mL Oral Daily PRN Onuoha, Chinwendu V, NP       methocarbamol (ROBAXIN) tablet 500 mg  500 mg Oral Q8H PRN Onuoha, Chinwendu V, NP       ondansetron (ZOFRAN-ODT) disintegrating tablet 4 mg  4 mg Oral Q6H PRN Onuoha, Chinwendu V, NP       oxymetazoline (AFRIN) 0.05 % nasal spray 1 spray  1 spray Each Nare BID PRN Onuoha, Chinwendu V, NP       sertraline (ZOLOFT) tablet 50 mg  50 mg Oral Daily Rex Kras, MD   50 mg at 09/04/23 1052   traZODone (DESYREL) tablet 50 mg  50 mg Oral QHS PRN Onuoha, Chinwendu V, NP       Current Outpatient Medications  Medication Sig Dispense Refill   haloperidol (HALDOL) 5 MG tablet Take 1 tablet (5 mg total) by mouth at bedtime for 10 days. 10 tablet 0   haloperidol decanoate (HALDOL DECANOATE) 100 MG/ML injection Inject 0.5 mLs  (50 mg total) into the muscle every 30 (thirty) days for 1 dose. Next dose is due to be administerred on 08-10-23. 0.5 mL 0    Labs  Lab Results:  Admission on 09/01/2023, Discharged on 09/02/2023  Component Date Value Ref Range Status   WBC 09/01/2023 6.0  4.0 - 10.5 K/uL Final   RBC 09/01/2023 4.45  4.22 - 5.81 MIL/uL Final   Hemoglobin 09/01/2023 13.0  13.0 - 17.0 g/dL Final   HCT 78/46/9629 39.3  39.0 - 52.0 % Final   MCV 09/01/2023 88.3  80.0 - 100.0 fL Final   MCH 09/01/2023 29.2  26.0 - 34.0 pg Final   MCHC 09/01/2023 33.1  30.0 - 36.0 g/dL Final   RDW 52/84/1324 12.1  11.5 - 15.5 % Final   Platelets 09/01/2023 207  150 - 400 K/uL Final   nRBC 09/01/2023 0.0  0.0 - 0.2 % Final   Neutrophils Relative % 09/01/2023 66  % Final   Neutro Abs 09/01/2023 4.0  1.7 - 7.7 K/uL Final   Lymphocytes Relative 09/01/2023 14  % Final   Lymphs Abs 09/01/2023 0.8  0.7 - 4.0 K/uL Final   Monocytes Relative 09/01/2023 18  % Final   Monocytes Absolute 09/01/2023 1.1 (H)  0.1 - 1.0 K/uL Final   Eosinophils Relative 09/01/2023 1  % Final   Eosinophils Absolute 09/01/2023 0.1  0.0 - 0.5 K/uL Final   Basophils Relative 09/01/2023 1  % Final   Basophils Absolute 09/01/2023 0.0  0.0 - 0.1 K/uL Final   Immature Granulocytes 09/01/2023 0  % Final   Abs Immature Granulocytes 09/01/2023 0.02  0.00 - 0.07 K/uL Final   Performed at [facility], [address] [place], [place] [address]   Sodium 09/01/2023 134 (L)  135 - 145 mmol/L Final   Potassium 09/01/2023 3.5  3.5 - 5.1 mmol/L Final   Chloride 09/01/2023 101  98 - 111 mmol/L Final   CO2 09/01/2023 22  22 - 32 mmol/L Final   Glucose, Bld 09/01/2023 102 (H)  70 - 99 mg/dL Final   Glucose reference range applies only to samples taken after fasting for at least 8 hours.   BUN 09/01/2023 16  6 - 20 mg/dL Final   Creatinine, Ser 09/01/2023 0.92  0.61 - 1.24 mg/dL Final   Calcium 72/53/6644 8.8 (L)  8.9 - 10.3 mg/dL Final   Total Protein  09/01/2023 7.2  6.5 - 8.1 g/dL Final   Albumin 03/47/4259 4.3  3.5 - 5.0 g/dL Final   AST 56/38/7564 19  15 - 41 U/L Final   ALT 09/01/2023 14  0 - 44 U/L Final   Alkaline Phosphatase 09/01/2023 49  38 - 126 U/L Final   Total Bilirubin 09/01/2023 2.2 (H)  0.0 - 1.2 mg/dL Final   GFR, Estimated 09/01/2023 >60  >60 mL/min Final   Comment: (NOTE) Calculated using the CKD-EPI Creatinine Equation (2021)    Anion gap 09/01/2023 11  5 - 15 Final   Performed at [facility], [address], [place], [place] [address]   Alcohol, Ethyl (B) 09/01/2023 <10  <10 mg/dL Final   Comment: (NOTE) Lowest detectable limit for serum alcohol is 10 mg/dL.  For medical purposes only. Performed at [facility], [address], [place], [place] [address]    Acetaminophen (Tylenol), Serum 09/01/2023 <10 (L)  10 - 30 ug/mL Final   Comment: (NOTE) Therapeutic concentrations vary significantly. A range of 10-30 ug/mL  may be an effective concentration for many patients. However, some  are best treated at concentrations outside of this range. Acetaminophen concentrations >150 ug/mL at 4 hours after ingestion  and >50 ug/mL at 12 hours after ingestion are often associated with  toxic reactions.  Performed at [facility], [address] [place], [place] [address]    Salicylate  Lvl 09/01/2023 <7.0 (L)  7.0 - 30.0 mg/dL Final   Performed at [facility], [address], [place], [place] [address]   Opiates 09/01/2023 NONE DETECTED  NONE DETECTED Final   Cocaine 09/01/2023 NONE DETECTED  NONE DETECTED Final   Benzodiazepines 09/01/2023 NONE DETECTED  NONE DETECTED Final   Amphetamines 09/01/2023 NONE DETECTED  NONE DETECTED Final   Tetrahydrocannabinol 09/01/2023 POSITIVE (A)  NONE DETECTED Final   Barbiturates 09/01/2023 NONE DETECTED  NONE DETECTED Final   Comment: (NOTE) DRUG SCREEN FOR MEDICAL PURPOSES ONLY.  IF CONFIRMATION IS  NEEDED FOR ANY PURPOSE, NOTIFY LAB WITHIN 5 DAYS.  LOWEST DETECTABLE LIMITS FOR URINE DRUG SCREEN Drug Class                     Cutoff (ng/mL) Amphetamine and metabolites    1000 Barbiturate and metabolites    200 Benzodiazepine                 200 Opiates and metabolites        300 Cocaine and metabolites        300 THC                            50 Performed at [facility], [address] [place], [place] [address]   Admission on 07/07/2023, Discharged on 07/12/2023  Component Date Value Ref Range Status   Cholesterol 07/07/2023 117  0 - 200 mg/dL Final   Triglycerides 29/56/2130 22  <150 mg/dL Final   HDL 86/57/8469 56  >40 mg/dL Final   Total CHOL/HDL Ratio 07/07/2023 2.1  RATIO Final   VLDL 07/07/2023 4  0 - 40 mg/dL Final   LDL Cholesterol 07/07/2023 57  0 - 99 mg/dL Final   Comment:        Total Cholesterol/HDL:CHD Risk Coronary Heart Disease Risk Table                     Men   Women  1/2 Average Risk   3.4   3.3  Average Risk       5.0   4.4  2 X Average Risk   9.6   7.1  3 X Average Risk  23.4   11.0        Use the calculated Patient Ratio above and the CHD Risk Table to determine the patient's CHD Risk.        ATP III CLASSIFICATION (LDL):  <100     mg/dL   Optimal  629-528  mg/dL   Near or Above                    Optimal  130-159  mg/dL   Borderline  413-244  mg/dL   High  >010     mg/dL   Very High Performed at [facility], [address], [place], [place] [address]    Hgb A1c MFr Bld 07/07/2023 5.1  4.8 - 5.6 % Final   Comment: (NOTE)         Prediabetes: 5.7 - 6.4         Diabetes: >6.4         Glycemic control for adults with diabetes: <7.0    Mean Plasma Glucose 07/07/2023 100  mg/dL Final   Comment: (NOTE) Performed At: [facility] [address] [place], [place] [id number] Jolene Schimke MD QV:[id number]  TSH 07/07/2023 2.114  0.350 - 4.500 uIU/mL Final   Comment: Performed by a 3rd  Generation assay with a functional sensitivity of <=0.01 uIU/mL. Performed at [facility], [address] [place], [place] [address]   Admission on 07/06/2023, Discharged on 07/06/2023  Component Date Value Ref Range Status   WBC 07/06/2023 6.6  4.0 - 10.5 K/uL Final   RBC 07/06/2023 5.23  4.22 - 5.81 MIL/uL Final   Hemoglobin 07/06/2023 15.0  13.0 - 17.0 g/dL Final   HCT 60/45/4098 45.4  39.0 - 52.0 % Final   MCV 07/06/2023 86.8  80.0 - 100.0 fL Final   MCH 07/06/2023 28.7  26.0 - 34.0 pg Final   MCHC 07/06/2023 33.0  30.0 - 36.0 g/dL Final   RDW 11/91/4782 11.9  11.5 - 15.5 % Final   Platelets 07/06/2023 236  150 - 400 K/uL Final   nRBC 07/06/2023 0.0  0.0 - 0.2 % Final   Neutrophils Relative % 07/06/2023 59  % Final   Neutro Abs 07/06/2023 3.9  1.7 - 7.7 K/uL Final   Lymphocytes Relative 07/06/2023 27  % Final   Lymphs Abs 07/06/2023 1.8  0.7 - 4.0 K/uL Final   Monocytes Relative 07/06/2023 14  % Final   Monocytes Absolute 07/06/2023 0.9  0.1 - 1.0 K/uL Final   Eosinophils Relative 07/06/2023 0  % Final   Eosinophils Absolute 07/06/2023 0.0  0.0 - 0.5 K/uL Final   Basophils Relative 07/06/2023 0  % Final   Basophils Absolute 07/06/2023 0.0  0.0 - 0.1 K/uL Final   Immature Granulocytes 07/06/2023 0  % Final   Abs Immature Granulocytes 07/06/2023 0.01  0.00 - 0.07 K/uL Final   Performed at [facility], [address] [place], [place] [address]   Sodium 07/06/2023 136  135 - 145 mmol/L Final   Potassium 07/06/2023 3.3 (L)  3.5 - 5.1 mmol/L Final   Chloride 07/06/2023 101  98 - 111 mmol/L Final   CO2 07/06/2023 25  22 - 32 mmol/L Final   Glucose, Bld 07/06/2023 85  70 - 99 mg/dL Final   Glucose reference range applies only to samples taken after fasting for at least 8 hours.   BUN 07/06/2023 7  6 - 20 mg/dL Final   Creatinine, Ser 07/06/2023 1.03  0.61 - 1.24 mg/dL Final   Calcium 30/86/5784 9.6  8.9 - 10.3 mg/dL Final   Total Protein 69/62/9528 7.4   6.5 - 8.1 g/dL Final   Albumin 41/32/4401 4.6  3.5 - 5.0 g/dL Final   AST 02/72/5366 20  15 - 41 U/L Final   ALT 07/06/2023 18  0 - 44 U/L Final   Alkaline Phosphatase 07/06/2023 53  38 - 126 U/L Final   Total Bilirubin 07/06/2023 2.9 (H)  0.0 - 1.2 mg/dL Final   GFR, Estimated 07/06/2023 >60  >60 mL/min Final   Comment: (NOTE) Calculated using the CKD-EPI Creatinine Equation (2021)    Anion gap 07/06/2023 10  5 - 15 Final   Performed at [facility], [address] [place], [place] [address]   Alcohol, Ethyl (B) 07/06/2023 <10  <10 mg/dL Final   Comment: (NOTE) Lowest detectable limit for serum alcohol is 10 mg/dL.  For medical purposes only. Performed at [facility], [address], [place], [place] [address]    TSH 07/06/2023 3.214  0.350 - 4.500 uIU/mL Final   Comment: Performed by a 3rd Generation assay with a functional  sensitivity of <=0.01 uIU/mL. Performed at [facility], [address] [place], [place] [address]    POC Amphetamine UR 07/06/2023 None Detected  NONE DETECTED (Cut Off Level 1000 ng/mL) Final   POC Secobarbital (BAR) 07/06/2023 None Detected  NONE DETECTED (Cut Off Level 300 ng/mL) Final   POC Buprenorphine (BUP) 07/06/2023 None Detected  NONE DETECTED (Cut Off Level 10 ng/mL) Final   POC Oxazepam (BZO) 07/06/2023 None Detected  NONE DETECTED (Cut Off Level 300 ng/mL) Final   POC Cocaine UR 07/06/2023 None Detected  NONE DETECTED (Cut Off Level 300 ng/mL) Final   POC Methamphetamine UR 07/06/2023 None Detected  NONE DETECTED (Cut Off Level 1000 ng/mL) Final   POC Morphine 07/06/2023 None Detected  NONE DETECTED (Cut Off Level 300 ng/mL) Final   POC Methadone UR 07/06/2023 None Detected  NONE DETECTED (Cut Off Level 300 ng/mL) Final   POC Oxycodone UR 07/06/2023 None Detected  NONE DETECTED (Cut Off Level 100 ng/mL) Final   POC Marijuana UR 07/06/2023 None Detected  NONE DETECTED (Cut Off Level 50 ng/mL) Final    Blood Alcohol level:   Lab Results  Component Value Date   ETH <10 09/01/2023   ETH <10 07/06/2023    Metabolic Disorder Labs: Lab Results  Component Value Date   HGBA1C 5.1 07/07/2023   MPG 100 07/07/2023   MPG 79.58 08/07/2021   No results found for: "PROLACTIN" Lab Results  Component Value Date   CHOL 117 07/07/2023   TRIG 22 07/07/2023   HDL 56 07/07/2023   CHOLHDL 2.1 07/07/2023   VLDL 4 07/07/2023   LDLCALC 57 07/07/2023   LDLCALC 78 08/07/2021    Therapeutic Lab Levels: No results found for: "LITHIUM" No results found for: "VALPROATE" No results found for: "CBMZ"  Physical Findings   AIMS    Flowsheet Row [facility]  AIMS Total Score 0      AUDIT    Flowsheet Row Admission (Discharged[facility] Admission (Discharged[facility]  Alcohol Use Disorder Identification Test Final Score (AUDIT) 8 0      GAD-7    Flowsheet Row Clinical Support from 09/01/2021 in [facility]  Total GAD-7 Score 16      PHQ2-9    Flowsheet Row ED from 09/02/2023 in [facility] ED from 06/24/2022 in [facility] ED from 10/22/2021 in [facility] Emergency Department at [facility] Counselor from 09/01/2021 in [place]  PHQ-2 Total Score 5 4 4 4   PHQ-9 Total Score 18 11 17 11       Flowsheet Row ED from 09/02/2023 in [facility] ED from 09/01/2023 in [facility] Emergency Department at [facility] Admission (Discharged) [facility]  C-SSRS RISK CATEGORY No Risk No Risk High Risk        Musculoskeletal  Strength & Muscle Tone: within normal limits Gait & Station: normal Patient leans: N/A  Psychiatric  Specialty Exam  Presentation  General Appearance:  Casual  Eye Contact: Poor  Speech: Clear and Coherent; Slow  Speech Volume: Decreased  Handedness: Right   Mood and Affect  Mood: Depressed; Anxious  Affect: Congruent   Thought Process  Thought Processes: Coherent  Descriptions of Associations:Loose  Orientation:Full (Time, Place and  Person)  Thought Content:Paranoid Ideation  Diagnosis of Schizophrenia or Schizoaffective disorder in past: Yes  Duration of Psychotic Symptoms: Greater than six months   Hallucinations:No data recorded  Ideas of Reference:Paranoia  Suicidal Thoughts:No data recorded  Homicidal Thoughts:No data recorded   Sensorium  Memory: Immediate Fair  Judgment: Poor  Insight: Poor   Executive Functions  Concentration: Fair  Attention Span: Fair  Recall: [employer] of Knowledge: Fair  Language: Fair   Psychomotor Activity  Psychomotor Activity: No data recorded   Assets  Assets: Communication Skills; Desire for Improvement   Sleep  Sleep: No data recorded   No data recorded   Physical Exam  Physical Exam Constitutional:      Appearance: Normal appearance. He is normal weight.  HENT:     Head: Normocephalic.  Neurological:     General: No focal deficit present.     Mental Status: He is alert and oriented to person, place, and time. Mental status is at baseline.    ROS Blood pressure 105/66, pulse 70, temperature 98.3 F (36.8 C), temperature source Oral, resp. rate 16, SpO2 98%. There is no height or weight on file to calculate BMI.  Treatment Plan Summary: Daily contact with patient to assess and evaluate symptoms and progress in treatment, Medication management, and Plan the plan is to restart his home medications.  Continue with detox as indicated.  We will closely monitor his symptoms and assess his depression and consider outpatient rehab. His medications included Seroquel 25 mg at  bedtime Trazodone 50 mg at bedtime as needed Haldol Decanoate 50 mg IM today. We will change oral Haldol to as needed only. Continue Zoloft 50 mg a day  Rex Kras, MD 09/04/2023 11:53 AM

## 2023-09-04 NOTE — ED Notes (Signed)
 Patient in the Dayroom calm and composed watching TV with other patients.  NAD,Denies needing anything atm.  Respirations even and unlabored. Will keep monitoring for safety

## 2023-09-04 NOTE — ED Notes (Signed)
 Patient in the bedroom calm and sleeping.  NAD.Respirations even and unlabored.  Will keep monitoring for safety

## 2023-09-04 NOTE — Group Note (Signed)
 Group Topic: Relapse and Recovery  Group Date: 09/04/2023 Start Time: 2000 End Time: 2100 Facilitators: Rae Lips B  Department: [facility]  Number of Participants: 4  Group Focus: abuse issues, acceptance, and activities of daily living skills Treatment Modality:  Leisure Development Interventions utilized were leisure development Purpose: enhance coping skills, express feelings, increase insight, and relapse prevention strategies  Name: Jerry Garrison Date of Birth: Mar 09, 2001  MR: [id number]    Level of Participation: active Quality of Participation: attentive and cooperative Interactions with others: gave feedback Mood/Affect: appropriate Triggers (if applicable): NA Cognition: coherent/clear Progress: Gaining insight Response: NA Plan: patient will be encouraged to go to groups.   Patients Problems:  Patient Active Problem List   Diagnosis Date Noted   Methamphetamine use disorder, severe, dependence (HCC) 09/02/2023   Schizophrenia, unspecified (HCC) 01/27/2022   Tobacco use disorder 01/27/2022   Cannabis use disorder 01/27/2022   Accidental drug overdose    Overdose of opiate or related narcotic, accidental or unintentional, initial encounter (HCC) 01/25/2022   Acute respiratory failure with hypoxia (HCC) 01/25/2022   Aspiration pneumonia (HCC) 01/25/2022   Transaminitis 01/25/2022   Amphetamine use disorder, severe (HCC)    Schizo affective schizophrenia (HCC) 09/01/2021   Schizophrenia (HCC) 08/07/2021   Brief psychotic disorder (HCC) 01/06/2021   Threatening to others 12/12/2020

## 2023-09-04 NOTE — ED Notes (Signed)
 Patient is resting in bed with eyes closed, even unlabored breathing. Staff will continue to monitor safety and for changes in condition.

## 2023-09-04 NOTE — Group Note (Signed)
 Group Topic: Balance in Life  Group Date: 09/04/2023 Start Time: 0310 End Time: 0340 Facilitators: Concha Norway, NT  Department: [facility]  Number of Participants: 8  Group Focus: acceptance Treatment Modality:  Behavior Modification Therapy Interventions utilized were clarification Purpose: explore maladaptive thinking  Name: Jerry Garrison Date of Birth: 2001-04-12  MR: [id number]    Level of Participation: moderate Quality of Participation: attentive Interactions with others: gave feedback Mood/Affect: appropriate Triggers (if applicable):  Cognition: concrete Progress: Significant Response:   Plan: follow-up needed  Patients Problems:  Patient Active Problem List   Diagnosis Date Noted   Methamphetamine use disorder, severe, dependence (HCC) 09/02/2023   Schizophrenia, unspecified (HCC) 01/27/2022   Tobacco use disorder 01/27/2022   Cannabis use disorder 01/27/2022   Accidental drug overdose    Overdose of opiate or related narcotic, accidental or unintentional, initial encounter (HCC) 01/25/2022   Acute respiratory failure with hypoxia (HCC) 01/25/2022   Aspiration pneumonia (HCC) 01/25/2022   Transaminitis 01/25/2022   Amphetamine use disorder, severe (HCC)    Schizo affective schizophrenia (HCC) 09/01/2021   Schizophrenia (HCC) 08/07/2021   Brief psychotic disorder (HCC) 01/06/2021   Threatening to others 12/12/2020

## 2023-09-05 DIAGNOSIS — Z91148 Patient's other noncompliance with medication regimen for other reason: Secondary | ICD-10-CM

## 2023-09-05 DIAGNOSIS — F1510 Other stimulant abuse, uncomplicated: Secondary | ICD-10-CM | POA: Diagnosis not present

## 2023-09-05 DIAGNOSIS — F1914 Other psychoactive substance abuse with psychoactive substance-induced mood disorder: Secondary | ICD-10-CM | POA: Diagnosis not present

## 2023-09-05 NOTE — Group Note (Signed)
 Group Topic: Recovery Basics  Group Date: 09/05/2023 Start Time: 1230 End Time: 1300 Facilitators: Jenean Lindau, RN  Department: [facility]  Number of Participants: 8  Group Focus: chemical dependency education, chemical dependency issues, and clarity of thought Treatment Modality:  Behavior Modification Therapy Interventions utilized were exploration and patient education Purpose: enhance coping skills, explore maladaptive thinking, express feelings, express irrational fears, improve communication skills, increase insight, regain self-worth, reinforce self-care, relapse prevention strategies, and trigger / craving management  Name: Jerry Garrison Date of Birth: 2001-05-30  MR: [id number]    Level of Participation: minimal Quality of Participation: attentive Interactions with others: gave feedback Mood/Affect: appropriate Triggers (if applicable):   Cognition: coherent/clear Progress: Gaining insight Response:   Plan: follow-up needed  Patients Problems:  Patient Active Problem List   Diagnosis Date Noted   Methamphetamine use disorder, severe, dependence (HCC) 09/02/2023   Schizophrenia, unspecified (HCC) 01/27/2022   Tobacco use disorder 01/27/2022   Cannabis use disorder 01/27/2022   Accidental drug overdose    Overdose of opiate or related narcotic, accidental or unintentional, initial encounter (HCC) 01/25/2022   Acute respiratory failure with hypoxia (HCC) 01/25/2022   Aspiration pneumonia (HCC) 01/25/2022   Transaminitis 01/25/2022   Amphetamine use disorder, severe (HCC)    Schizo affective schizophrenia (HCC) 09/01/2021   Schizophrenia (HCC) 08/07/2021   Brief psychotic disorder (HCC) 01/06/2021   Threatening to others 12/12/2020

## 2023-09-05 NOTE — Group Note (Signed)
 Group Topic: Positive Affirmations  Group Date: 09/05/2023 Start Time: 2030 End Time: 2045 Facilitators: Lauro Finola Rosal, NT  Department: [facility]  Number of Participants: 5  Group Focus: acceptance, affirmation, and feeling awareness/expression Treatment Modality:  Behavior Modification Therapy and Cognitive Behavioral Therapy Interventions utilized were clarification, leisure development, and support Purpose: express feelings, improve communication skills, and increase insight  Name: Jerry Garrison Date of Birth: 08-26-2000  MR: [id number]    Level of Participation: active Quality of Participation: attentive and cooperative Interactions with others: gave feedback Mood/Affect: appropriate Triggers (if applicable): N/A Cognition: coherent/clear Progress: Gaining insight Response: N/A Plan: patient will be encouraged to keep attending groups.  Patients Problems:  Patient Active Problem List   Diagnosis Date Noted   Methamphetamine use disorder, severe, dependence (HCC) 09/02/2023   Schizophrenia, unspecified (HCC) 01/27/2022   Tobacco use disorder 01/27/2022   Cannabis use disorder 01/27/2022   Accidental drug overdose    Overdose of opiate or related narcotic, accidental or unintentional, initial encounter (HCC) 01/25/2022   Acute respiratory failure with hypoxia (HCC) 01/25/2022   Aspiration pneumonia (HCC) 01/25/2022   Transaminitis 01/25/2022   Amphetamine use disorder, severe (HCC)    Schizo affective schizophrenia (HCC) 09/01/2021   Schizophrenia (HCC) 08/07/2021   Brief psychotic disorder (HCC) 01/06/2021   Threatening to others 12/12/2020

## 2023-09-05 NOTE — ED Provider Notes (Addendum)
 Behavioral Health Progress Note  Date and Time: 09/05/2023 3:08 PM Name: Jerry Garrison MRN:  [id number]  Reason for admission   Jerry Garrison is a 23 y.o. male with prior psychiatric history of schizophrenia v schizoaffective disorder v SIPD and methamphetamine use d/o who arrived to [facility] as a direct admit to [facility] (09/02/2023) from [facility] with complaints of paranoia and methamphetamine abuse.  Patient is seeking detox. On admission, reported that he has had at least 5 ED visits and 1 inpatient psychiatric hospitalization in the past 6 months.  He was recently admitted to [facility] between 07/07/2023 to 07/12/2023 for worsening psychosis.  Information obtained during interview   Mood: ("good" somewhat anxious, but manageable) Sleep:Fair Appetite: Good SI:No HI:No JXB:JYNW (Denied AVH) Delusions:None (Denied paranoia, did not make any delusional statements. Feels safe here)   Logical, WDL (going home to mom's after dc. denied medication side effects currently, but did have a "locked jaw" feeling yesterday that has self resolved, no soreness as LAI site. denied cravings, no withdrawal sxs. is considering SIOP. no other questions or concerns)   Review of Systems  Constitutional:  Negative for malaise/fatigue.  HENT:  Negative for congestion.   Respiratory:  Negative for shortness of breath.   Cardiovascular:  Negative for chest pain.  Gastrointestinal:  Negative for abdominal pain, nausea and vomiting.  Musculoskeletal:        A lock jaw last night that resolved  Neurological:  Negative for dizziness, tremors and headaches.    Diagnosis:  Final diagnoses:  Methamphetamine abuse (HCC)  Polysubstance abuse (HCC)  Substance induced mood disorder (HCC)  Non compliance w medication regimen   Total Time spent with patient: 30 minutes  Past Psychiatric History: Past psychiatric history significant for history of at least 1 hospitalization to [facility] for psychosis.  He also has a history of  methamphetamine and marijuana abuse. Past Medical History: None noted Family History: Unknown Family Psychiatric  History: Unknown Social History: Patient is single and reports that he completed high school.  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  Additional Social History:                         Current Medications:  Current Facility-Administered Medications  Medication Dose Route Frequency Provider Last Rate Last Admin   acetaminophen (TYLENOL) tablet 650 mg  650 mg Oral Q6H PRN Onuoha, Chinwendu V, NP       alum & mag hydroxide-simeth (MAALOX/MYLANTA) 200-200-20 MG/5ML suspension 30 mL  30 mL Oral Q4H PRN Onuoha, Chinwendu V, NP       dicyclomine (BENTYL) tablet 20 mg  20 mg Oral Q6H PRN Onuoha, Chinwendu V, NP       haloperidol (HALDOL) tablet 5 mg  5 mg Oral TID PRN Onuoha, Chinwendu V, NP       And   diphenhydrAMINE (BENADRYL) capsule 50 mg  50 mg Oral TID PRN Onuoha, Chinwendu V, NP   50 mg at 09/02/23 0255   haloperidol lactate (HALDOL) injection 5 mg  5 mg Intramuscular TID PRN Onuoha, Chinwendu V, NP       And   diphenhydrAMINE (BENADRYL) injection 50 mg  50 mg Intramuscular TID PRN Onuoha, Chinwendu V, NP       And   LORazepam (ATIVAN) injection 2 mg  2 mg Intramuscular TID PRN Onuoha, Chinwendu V, NP       haloperidol  lactate (HALDOL) injection 10 mg  10 mg Intramuscular TID PRN Onuoha, Chinwendu V, NP       And   diphenhydrAMINE (BENADRYL) injection 50 mg  50 mg Intramuscular TID PRN Onuoha, Chinwendu V, NP       And   LORazepam (ATIVAN) injection 2 mg  2 mg Intramuscular TID PRN Onuoha, Chinwendu V, NP       haloperidol decanoate (HALDOL DECANOATE) 100 MG/ML injection 50 mg  50 mg Intramuscular Q28 days Rex Kras, MD   50 mg at 09/04/23 1325   hydrOXYzine (ATARAX) tablet 25 mg  25 mg Oral Q6H PRN Onuoha, Chinwendu V, NP   25 mg at 09/04/23 2108   loperamide (IMODIUM) capsule 2-4 mg  2-4 mg Oral PRN Onuoha,  Chinwendu V, NP       magnesium hydroxide (MILK OF MAGNESIA) suspension 30 mL  30 mL Oral Daily PRN Onuoha, Chinwendu V, NP       methocarbamol (ROBAXIN) tablet 500 mg  500 mg Oral Q8H PRN Onuoha, Chinwendu V, NP       ondansetron (ZOFRAN-ODT) disintegrating tablet 4 mg  4 mg Oral Q6H PRN Onuoha, Chinwendu V, NP       sertraline (ZOLOFT) tablet 50 mg  50 mg Oral Daily Rex Kras, MD   50 mg at 09/05/23 1011   traZODone (DESYREL) tablet 50 mg  50 mg Oral QHS PRN Onuoha, Chinwendu V, NP   50 mg at 09/04/23 2107   Current Outpatient Medications  Medication Sig Dispense Refill   haloperidol (HALDOL) 5 MG tablet Take 1 tablet (5 mg total) by mouth at bedtime for 10 days. 10 tablet 0   haloperidol decanoate (HALDOL DECANOATE) 100 MG/ML injection Inject 0.5 mLs (50 mg total) into the muscle every 30 (thirty) days for 1 dose. Next dose is due to be administerred on 08-10-23. 0.5 mL 0    Labs  Lab Results:  Admission on 09/01/2023, Discharged on 09/02/2023  Component Date Value Ref Range Status   WBC 09/01/2023 6.0  4.0 - 10.5 K/uL Final   RBC 09/01/2023 4.45  4.22 - 5.81 MIL/uL Final   Hemoglobin 09/01/2023 13.0  13.0 - 17.0 g/dL Final   HCT 13/24/4010 39.3  39.0 - 52.0 % Final   MCV 09/01/2023 88.3  80.0 - 100.0 fL Final   MCH 09/01/2023 29.2  26.0 - 34.0 pg Final   MCHC 09/01/2023 33.1  30.0 - 36.0 g/dL Final   RDW 27/25/3664 12.1  11.5 - 15.5 % Final   Platelets 09/01/2023 207  150 - 400 K/uL Final   nRBC 09/01/2023 0.0  0.0 - 0.2 % Final   Neutrophils Relative % 09/01/2023 66  % Final   Neutro Abs 09/01/2023 4.0  1.7 - 7.7 K/uL Final   Lymphocytes Relative 09/01/2023 14  % Final   Lymphs Abs 09/01/2023 0.8  0.7 - 4.0 K/uL Final   Monocytes Relative 09/01/2023 18  % Final   Monocytes Absolute 09/01/2023 1.1 (H)  0.1 - 1.0 K/uL Final   Eosinophils Relative 09/01/2023 1  % Final   Eosinophils Absolute 09/01/2023 0.1  0.0 - 0.5 K/uL Final   Basophils Relative 09/01/2023 1  % Final    Basophils Absolute 09/01/2023 0.0  0.0 - 0.1 K/uL Final   Immature Granulocytes 09/01/2023 0  % Final   Abs Immature Granulocytes 09/01/2023 0.02  0.00 - 0.07 K/uL Final   Performed at [facility], [address], [place], [place] [address]  Sodium 09/01/2023 134 (L)  135 - 145 mmol/L Final   Potassium 09/01/2023 3.5  3.5 - 5.1 mmol/L Final   Chloride 09/01/2023 101  98 - 111 mmol/L Final   CO2 09/01/2023 22  22 - 32 mmol/L Final   Glucose, Bld 09/01/2023 102 (H)  70 - 99 mg/dL Final   Glucose reference range applies only to samples taken after fasting for at least 8 hours.   BUN 09/01/2023 16  6 - 20 mg/dL Final   Creatinine, Ser 09/01/2023 0.92  0.61 - 1.24 mg/dL Final   Calcium 40/98/1191 8.8 (L)  8.9 - 10.3 mg/dL Final   Total Protein 47/82/9562 7.2  6.5 - 8.1 g/dL Final   Albumin 13/02/6577 4.3  3.5 - 5.0 g/dL Final   AST 46/96/2952 19  15 - 41 U/L Final   ALT 09/01/2023 14  0 - 44 U/L Final   Alkaline Phosphatase 09/01/2023 49  38 - 126 U/L Final   Total Bilirubin 09/01/2023 2.2 (H)  0.0 - 1.2 mg/dL Final   GFR, Estimated 09/01/2023 >60  >60 mL/min Final   Comment: (NOTE) Calculated using the CKD-EPI Creatinine Equation (2021)    Anion gap 09/01/2023 11  5 - 15 Final   Performed at [facility], [address], [place], [place] [address]   Alcohol, Ethyl (B) 09/01/2023 <10  <10 mg/dL Final   Comment: (NOTE) Lowest detectable limit for serum alcohol is 10 mg/dL.  For medical purposes only. Performed at [facility], [address], [place], [place] [address]    Acetaminophen (Tylenol), Serum 09/01/2023 <10 (L)  10 - 30 ug/mL Final   Comment: (NOTE) Therapeutic concentrations vary significantly. A range of 10-30 ug/mL  may be an effective concentration for many patients. However, some  are best treated at concentrations outside of this range. Acetaminophen concentrations >150 ug/mL at 4 hours after ingestion  and  >50 ug/mL at 12 hours after ingestion are often associated with  toxic reactions.  Performed at [facility], [address] [place], [place] [address]    Salicylate Lvl 09/01/2023 <7.0 (L)  7.0 - 30.0 mg/dL Final   Performed at [facility], [address] [place], [place] [address]   Opiates 09/01/2023 NONE DETECTED  NONE DETECTED Final   Cocaine 09/01/2023 NONE DETECTED  NONE DETECTED Final   Benzodiazepines 09/01/2023 NONE DETECTED  NONE DETECTED Final   Amphetamines 09/01/2023 NONE DETECTED  NONE DETECTED Final   Tetrahydrocannabinol 09/01/2023 POSITIVE (A)  NONE DETECTED Final   Barbiturates 09/01/2023 NONE DETECTED  NONE DETECTED Final   Comment: (NOTE) DRUG SCREEN FOR MEDICAL PURPOSES ONLY.  IF CONFIRMATION IS NEEDED FOR ANY PURPOSE, NOTIFY LAB WITHIN 5 DAYS.  LOWEST DETECTABLE LIMITS FOR URINE DRUG SCREEN Drug Class                     Cutoff (ng/mL) Amphetamine and metabolites    1000 Barbiturate and metabolites    200 Benzodiazepine                 200 Opiates and metabolites        300 Cocaine and metabolites        300 THC                            50 Performed at [facility], [address] [place], [place] [address]   Admission  on 07/07/2023, Discharged on 07/12/2023  Component Date Value Ref Range Status   Cholesterol 07/07/2023 117  0 - 200 mg/dL Final   Triglycerides 16/04/9603 22  <150 mg/dL Final   HDL 54/03/8118 56  >40 mg/dL Final   Total CHOL/HDL Ratio 07/07/2023 2.1  RATIO Final   VLDL 07/07/2023 4  0 - 40 mg/dL Final   LDL Cholesterol 07/07/2023 57  0 - 99 mg/dL Final   Comment:        Total Cholesterol/HDL:CHD Risk Coronary Heart Disease Risk Table                     Men   Women  1/2 Average Risk   3.4   3.3  Average Risk       5.0   4.4  2 X Average Risk   9.6   7.1  3 X Average Risk  23.4   11.0        Use the calculated Patient Ratio above and the CHD Risk Table to determine  the patient's CHD Risk.        ATP III CLASSIFICATION (LDL):  <100     mg/dL   Optimal  147-829  mg/dL   Near or Above                    Optimal  130-159  mg/dL   Borderline  562-130  mg/dL   High  >865     mg/dL   Very High Performed at [facility], [address], [place], [place] [address]    Hgb A1c MFr Bld 07/07/2023 5.1  4.8 - 5.6 % Final   Comment: (NOTE)         Prediabetes: 5.7 - 6.4         Diabetes: >6.4         Glycemic control for adults with diabetes: <7.0    Mean Plasma Glucose 07/07/2023 100  mg/dL Final   Comment: (NOTE) Performed At: [facility] [address] [place], [place] [id number] Jolene Schimke MD KG:[id number]    TSH 07/07/2023 2.114  0.350 - 4.500 uIU/mL Final   Comment: Performed by a 3rd Generation assay with a functional sensitivity of <=0.01 uIU/mL. Performed at [facility], [address] [place], [place] [address]   Admission on 07/06/2023, Discharged on 07/06/2023  Component Date Value Ref Range Status   WBC 07/06/2023 6.6  4.0 - 10.5 K/uL Final   RBC 07/06/2023 5.23  4.22 - 5.81 MIL/uL Final   Hemoglobin 07/06/2023 15.0  13.0 - 17.0 g/dL Final   HCT 47/42/5956 45.4  39.0 - 52.0 % Final   MCV 07/06/2023 86.8  80.0 - 100.0 fL Final   MCH 07/06/2023 28.7  26.0 - 34.0 pg Final   MCHC 07/06/2023 33.0  30.0 - 36.0 g/dL Final   RDW 38/75/6433 11.9  11.5 - 15.5 % Final   Platelets 07/06/2023 236  150 - 400 K/uL Final   nRBC 07/06/2023 0.0  0.0 - 0.2 % Final   Neutrophils Relative % 07/06/2023 59  % Final   Neutro Abs 07/06/2023 3.9  1.7 - 7.7 K/uL Final   Lymphocytes Relative 07/06/2023 27  % Final   Lymphs Abs 07/06/2023 1.8  0.7 - 4.0 K/uL Final   Monocytes Relative 07/06/2023 14  % Final   Monocytes Absolute 07/06/2023 0.9  0.1 - 1.0 K/uL Final   Eosinophils Relative 07/06/2023 0  % Final  Eosinophils Absolute 07/06/2023 0.0  0.0 - 0.5 K/uL Final   Basophils Relative 07/06/2023 0  % Final    Basophils Absolute 07/06/2023 0.0  0.0 - 0.1 K/uL Final   Immature Granulocytes 07/06/2023 0  % Final   Abs Immature Granulocytes 07/06/2023 0.01  0.00 - 0.07 K/uL Final   Performed at [facility], [address], [place], [place] [address]   Sodium 07/06/2023 136  135 - 145 mmol/L Final   Potassium 07/06/2023 3.3 (L)  3.5 - 5.1 mmol/L Final   Chloride 07/06/2023 101  98 - 111 mmol/L Final   CO2 07/06/2023 25  22 - 32 mmol/L Final   Glucose, Bld 07/06/2023 85  70 - 99 mg/dL Final   Glucose reference range applies only to samples taken after fasting for at least 8 hours.   BUN 07/06/2023 7  6 - 20 mg/dL Final   Creatinine, Ser 07/06/2023 1.03  0.61 - 1.24 mg/dL Final   Calcium 60/45/4098 9.6  8.9 - 10.3 mg/dL Final   Total Protein 11/91/4782 7.4  6.5 - 8.1 g/dL Final   Albumin 95/62/1308 4.6  3.5 - 5.0 g/dL Final   AST 65/78/4696 20  15 - 41 U/L Final   ALT 07/06/2023 18  0 - 44 U/L Final   Alkaline Phosphatase 07/06/2023 53  38 - 126 U/L Final   Total Bilirubin 07/06/2023 2.9 (H)  0.0 - 1.2 mg/dL Final   GFR, Estimated 07/06/2023 >60  >60 mL/min Final   Comment: (NOTE) Calculated using the CKD-EPI Creatinine Equation (2021)    Anion gap 07/06/2023 10  5 - 15 Final   Performed at [facility], [address], [place], [place] [address]   Alcohol, Ethyl (B) 07/06/2023 <10  <10 mg/dL Final   Comment: (NOTE) Lowest detectable limit for serum alcohol is 10 mg/dL.  For medical purposes only. Performed at [facility], [address], [place], [place] [address]    TSH 07/06/2023 3.214  0.350 - 4.500 uIU/mL Final   Comment: Performed by a 3rd Generation assay with a functional sensitivity of <=0.01 uIU/mL. Performed at [facility], [address], [place], [place] [address]    POC Amphetamine UR 07/06/2023 None Detected  NONE DETECTED (Cut Off Level 1000 ng/mL) Final   POC Secobarbital (BAR) 07/06/2023 None Detected  NONE DETECTED (Cut Off Level 300 ng/mL)  Final   POC Buprenorphine (BUP) 07/06/2023 None Detected  NONE DETECTED (Cut Off Level 10 ng/mL) Final   POC Oxazepam (BZO) 07/06/2023 None Detected  NONE DETECTED (Cut Off Level 300 ng/mL) Final   POC Cocaine UR 07/06/2023 None Detected  NONE DETECTED (Cut Off Level 300 ng/mL) Final   POC Methamphetamine UR 07/06/2023 None Detected  NONE DETECTED (Cut Off Level 1000 ng/mL) Final   POC Morphine 07/06/2023 None Detected  NONE DETECTED (Cut Off Level 300 ng/mL) Final   POC Methadone UR 07/06/2023 None Detected  NONE DETECTED (Cut Off Level 300 ng/mL) Final   POC Oxycodone UR 07/06/2023 None Detected  NONE DETECTED (Cut Off Level 100 ng/mL) Final   POC Marijuana UR 07/06/2023 None Detected  NONE DETECTED (Cut Off Level 50 ng/mL) Final    Blood Alcohol level:  Lab Results  Component Value Date   ETH <10 09/01/2023   ETH <10 07/06/2023   Metabolic Disorder Labs: Lab Results  Component Value Date   HGBA1C 5.1 07/07/2023   MPG 100 07/07/2023   MPG 79.58 08/07/2021  No results found for: "PROLACTIN" Lab Results  Component Value Date   CHOL 117 07/07/2023   TRIG 22 07/07/2023   HDL 56 07/07/2023   CHOLHDL 2.1 07/07/2023   VLDL 4 07/07/2023   LDLCALC 57 07/07/2023   LDLCALC 78 08/07/2021    Therapeutic Lab Levels: No results found for: "LITHIUM" No results found for: "VALPROATE" No results found for: "CBMZ"  Physical Findings   AIMS    Flowsheet Row [facility]  AIMS Total Score 0      AUDIT    Flowsheet Row [facility] Admission (Dis[facility]  Alcohol Use Disorder Identification Test Final Score (AUDIT) 8 0      GAD-7    Flowsheet Row Clinical Support from 09/01/2021 in [facility]  Total GAD-7 Score 16      PHQ2-9     Flowsheet Row ED from 09/02/2023 in [facility] from 06/24/2022 in [facility] ED from 10/22/2021 in [facility] Counselor from 09/01/2021 in [facility]  PHQ-2 Total Score 5 4 4 4   PHQ-9 Total Score 18 11 17 11       Flowsheet Row ED from 09/02/2023 in [facility] ED from 09/01/2023 in [facility] at [facility] Admission (Discharged) [facility]  C-SSRS RISK CATEGORY No Risk No Risk High Risk        Musculoskeletal  Strength & Muscle Tone: within normal limits Gait & Station: normal Patient leans: N/A  Psychiatric Specialty Exam  Presentation General Appearance:Appropriate for Environment, Casual, Fairly Groomed Eye Contact:Good Speech:Clear and Coherent, Normal Rate (spontaneous) Volume:Normal Handedness:Right  Mood and Affect  Mood: ("good" somewhat anxious, but manageable) Affect:Appropriate, Congruent, Full Range (mildly anxious)  Thought Process  Thought Process:Coherent, Goal Directed, Linear Descriptions of Associations:Intact  Thought Content Suicidal Thoughts:No Homicidal Thoughts:No Hallucinations:None (Denied AVH) Ideas of Reference:None (Denied paranoia, did not make any delusional statements. Feels safe here) Thought Content:Logical, WDL (going home to mom's after dc. denied medication side effects currently, but did have a "locked jaw" feeling yesterday that has self resolved, no soreness as LAI site. denied cravings, no withdrawal sxs. is considering SIOP. no other questions or concerns)  Sensorium  Memory:Immediate Good Judgment:Fair Insight:Fair  Executive Functions  Orientation:Full (Time, Place and Person) Language:Good Concentration:Good Attention:Good Recall:Good Fund of Knowledge:Good  Psychomotor  Activity  Psychomotor Activity:Psychomotor Activity: Normal (No EPS or TD, AIMS 0)  Assets  Assets:Communication Skills, Desire for Improvement, Resilience  Sleep  Quality:Fair  Physical Exam  Physical Exam Constitutional:      Appearance: Normal appearance. He is normal weight.  HENT:     Head: Normocephalic.  Neurological:     General: No focal deficit present.     Mental Status: He is alert and oriented to person, place, and time. Mental status is at baseline.    Blood pressure 102/60, pulse 76, temperature 98.3 F (36.8 C), temperature source Oral, resp. rate 16, SpO2 100%. There is no height or weight on file to calculate BMI.  Treatment Plan Summary: Daily contact with patient to assess and evaluate symptoms and progress in treatment, Medication management, and Plan the plan is to restart his home medications.  Continue with detox as indicated.  We will closely monitor his symptoms and assess his depression and consider outpatient rehab. Total duration of encounter: 3 days  Appeared to be doing well today. No behavior concerns.  Patient was logical and linear, no sxs of psychosis. No med side effects, AIMS 0, no EPS day after receiving LAI. Mood is stable, some anxiety. No med changes today  His medications included: Continued Seroquel 25 mg at bedtime Continued Trazodone 50 mg at bedtime as needed Haldol Decanoate 50 mg IM received 09/04/2023 Continue Zoloft 50 mg a day  Princess Bruins, DO 09/05/2023 3:08 PM

## 2023-09-05 NOTE — Group Note (Signed)
 Group Topic: Relaxation  Group Date: 09/05/2023 Start Time: 1630 End Time: 1700 Facilitators: Vonzell Schlatter B  Department: [facility]  Number of Participants: 4  Group Focus: communication and concentration Treatment Modality:  Psychoeducation Interventions utilized were problem solving and support Purpose: enhance coping skills and express feelings  Name: Telford Archambeau Date of Birth: 2000/11/11  MR: [id number]    Level of Participation: active Quality of Participation: attentive and cooperative Interactions with others: gave feedback Mood/Affect: positive Triggers (if applicable): N/A Cognition: coherent/clear Progress: Moderate Response: Enjoyed group time and was smiling and talking, laughing and he felt a lot better today Plan: follow-up needed  Patients Problems:  Patient Active Problem List   Diagnosis Date Noted   Methamphetamine use disorder, severe, dependence (HCC) 09/02/2023   Schizophrenia, unspecified (HCC) 01/27/2022   Tobacco use disorder 01/27/2022   Cannabis use disorder 01/27/2022   Accidental drug overdose    Overdose of opiate or related narcotic, accidental or unintentional, initial encounter (HCC) 01/25/2022   Acute respiratory failure with hypoxia (HCC) 01/25/2022   Aspiration pneumonia (HCC) 01/25/2022   Transaminitis 01/25/2022   Amphetamine use disorder, severe (HCC)    Schizo affective schizophrenia (HCC) 09/01/2021   Schizophrenia (HCC) 08/07/2021   Brief psychotic disorder (HCC) 01/06/2021   Threatening to others 12/12/2020

## 2023-09-05 NOTE — ED Notes (Signed)
 Patient remains asleep in bed without issue or concern.  No evidence of withdrawal at this time.  Denies avh shi or plan.  Will monitor.

## 2023-09-06 DIAGNOSIS — F1510 Other stimulant abuse, uncomplicated: Secondary | ICD-10-CM | POA: Diagnosis not present

## 2023-09-06 NOTE — ED Notes (Signed)
 Pt is in the dayroom watching TV with peers. Pt denies SI/HI/AVH. Pt has no further complain.No acute distress noted. Will continue to monitor for safety and provide support.

## 2023-09-06 NOTE — ED Notes (Signed)
 Patient sitting in dayroom interacting with peers. No acute distress noted. No concerns voiced. Informed patient to notify staff with any needs or assistance. Patient verbalized understanding or agreement. Safety checks in place per facility policy.

## 2023-09-06 NOTE — Group Note (Signed)
 Group Topic: Wellness  Group Date: 09/06/2023 Start Time: 2030 End Time: 2045 Facilitators: Lauro Keyoni Lapinski, NT  Department: [facility]  Number of Participants: 4  Group Focus: activities of daily living skills and check in Treatment Modality:  Cognitive Behavioral Therapy Interventions utilized were clarification and support Purpose: express feelings and improve communication skills  Name: Jerry Garrison Date of Birth: 06-03-2001  MR: [id number]    Level of Participation: active Quality of Participation: cooperative Interactions with others: gave feedback Mood/Affect: appropriate Triggers (if applicable): N/A Cognition: coherent/clear Progress: Gaining insight Response: N/A Plan: patient will be encouraged to keep attending group.  Patients Problems:  Patient Active Problem List   Diagnosis Date Noted   Methamphetamine use disorder, severe, dependence (HCC) 09/02/2023   Schizophrenia, unspecified (HCC) 01/27/2022   Tobacco use disorder 01/27/2022   Cannabis use disorder 01/27/2022   Accidental drug overdose    Overdose of opiate or related narcotic, accidental or unintentional, initial encounter (HCC) 01/25/2022   Acute respiratory failure with hypoxia (HCC) 01/25/2022   Aspiration pneumonia (HCC) 01/25/2022   Transaminitis 01/25/2022   Amphetamine use disorder, severe (HCC)    Schizo affective schizophrenia (HCC) 09/01/2021   Schizophrenia (HCC) 08/07/2021   Brief psychotic disorder (HCC) 01/06/2021   Threatening to others 12/12/2020

## 2023-09-06 NOTE — Discharge Planning (Signed)
 LCSW contacted Pearlie Oyster at [facility] regarding an update. Per Deniece Portela, he has been able to speak with the patient's mother on this morning to inform her that he will need to speak with his team about moving forward. Mr. Deniece Portela reports their concern may be the diagnosis of schizophrenia, however his plan is to speak with the team within the next hour to figure out the plan. Mr. Deniece Portela reports if patient is accepted, patient will need a Hep B&C test, HIV, and TB. Mr. Deniece Portela aware that LCSW will follow up with MD to see if this could be completed if so. Updates will be provided once received.   Fernande Boyden, LCSW Clinical Social Worker [facility] Ph: [phone]

## 2023-09-06 NOTE — ED Notes (Signed)
 Patient alert & oriented x4. Denies intent to harm self or others when asked. Denies A/VH. Patient denies any physical complaints when asked. No acute distress noted. Scheduled medications administered with no complications. Support and encouragement provided. Routine safety checks conducted per facility protocol. Encouraged patient to notify staff if any thoughts of harm towards self or others arise. Patient verbalizes understanding and agreement.

## 2023-09-06 NOTE — Group Note (Signed)
 Group Topic: Change and Accountability  Group Date: 09/06/2023 Start Time: 1630 End Time: 1648 Facilitators: Vonzell Schlatter B  Department: [facility]  Number of Participants: 4  Group Focus: clarity of thought and daily focus Treatment Modality:  Psychoeducation Interventions utilized were problem solving and support Purpose: regain self-worth and reinforce self-care  Name: Jerry Garrison Date of Birth: [birth year]  MR: [id number]    Level of Participation: active Quality of Participation: attentive and cooperative Interactions with others: gave feedback Mood/Affect: positive Triggers (if applicable): N/A Cognition: coherent/clear Progress: Moderate Response: pt is ready to go back home and help work back at [employer]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 the [place] Plan: follow-up needed  Patients Problems:  Patient Active Problem List   Diagnosis Date Noted   Methamphetamine use disorder, severe, dependence (HCC) 09/02/2023   Schizophrenia, unspecified (HCC) 01/27/2022   Tobacco use disorder 01/27/2022   Cannabis use disorder 01/27/2022   Accidental drug overdose    Overdose of opiate or related narcotic, accidental or unintentional, initial encounter (HCC) 01/25/2022   Acute respiratory failure with hypoxia (HCC) 01/25/2022   Aspiration pneumonia (HCC) 01/25/2022   Transaminitis 01/25/2022   Amphetamine use disorder, severe (HCC)    Schizo affective schizophrenia (HCC) 09/01/2021   Schizophrenia (HCC) 08/07/2021   Brief psychotic disorder (HCC) 01/06/2021   Threatening to others 12/12/2020

## 2023-09-06 NOTE — Group Note (Signed)
 Group Topic: Social Support  Group Date: 09/06/2023 Start Time: 0830 End Time: 0845 Facilitators: Era Parr, Jacklynn Barnacle, RN  Department: [facility]  Number of Participants: 4  Group Focus: check in Treatment Modality:  Individual Therapy Interventions utilized were support Purpose: express feelings and increase insight  Name: Jerry Garrison Date of Birth: 2000-12-05  MR: [id number]    Level of Participation: moderate Quality of Participation: cooperative Interactions with others: gave feedback Mood/Affect: appropriate Triggers (if applicable): None identified Cognition: coherent/clear Progress: Gaining insight Response: Patient voiced feeling regarding detox process. No complaints at this time. Support provided. Plan: patient will be encouraged to continue treatment process.  Patients Problems:  Patient Active Problem List   Diagnosis Date Noted   Methamphetamine use disorder, severe, dependence (HCC) 09/02/2023   Schizophrenia, unspecified (HCC) 01/27/2022   Tobacco use disorder 01/27/2022   Cannabis use disorder 01/27/2022   Accidental drug overdose    Overdose of opiate or related narcotic, accidental or unintentional, initial encounter (HCC) 01/25/2022   Acute respiratory failure with hypoxia (HCC) 01/25/2022   Aspiration pneumonia (HCC) 01/25/2022   Transaminitis 01/25/2022   Amphetamine use disorder, severe (HCC)    Schizo affective schizophrenia (HCC) 09/01/2021   Schizophrenia (HCC) 08/07/2021   Brief psychotic disorder (HCC) 01/06/2021   Threatening to others 12/12/2020

## 2023-09-06 NOTE — ED Provider Notes (Signed)
 Behavioral Health Progress Note  Date and Time: 09/06/2023 2:29 PM Name: Jerry Garrison MRN:  [id number]  Reason for admission   Jerry Garrison is a 23 y.o. male with prior psychiatric history of schizophrenia v schizoaffective disorder v SIPD and methamphetamine use d/o who arrived to [facility] as a direct admit to [facility] (09/02/2023) from [facility] with complaints of paranoia and methamphetamine abuse.  Patient is seeking detox. On admission, reported that he has had at least 5 ED visits and 1 inpatient psychiatric hospitalization in the past 6 months.  He was recently admitted to [facility] between 07/07/2023 to 07/12/2023 for worsening psychosis.  Information obtained during interview   Mood: "good' Sleep: good Appetite: Good SI: Denies HI: denies AVH: denies Delusions:  denies  Patient is pleasant and euthymic today. Reports improved mood and fair sleep and appetite. Denies feeling hopeless and is tolerating medications without side effects. Patient states he is interested in residential rehab and referral was made with case management today. Patient denies SI, HI or AVH and does not appear to be responding to internal stimuli. Admits to drug of choice being methamphetamine. Denies any stiffness or dystonia.      Review of Systems  Constitutional:  Negative for malaise/fatigue.  HENT:  Negative for congestion.   Respiratory:  Negative for shortness of breath.   Cardiovascular:  Negative for chest pain.  Gastrointestinal:  Negative for abdominal pain, nausea and vomiting.  Genitourinary: Negative.   Musculoskeletal: Negative.        A lock jaw last night that resolved  Neurological:  Negative for dizziness, tremors and headaches.    Diagnosis:  Final diagnoses:  Methamphetamine abuse (HCC)  Polysubstance abuse (HCC)  Substance induced mood disorder (HCC)  Non compliance w medication regimen   Total Time spent with patient: 30 minutes  Past Psychiatric History: Past psychiatric history  significant for history of at least 1 hospitalization to [facility] for psychosis.  He also has a history of methamphetamine and marijuana abuse. Past Medical History: None noted Family History: Unknown Family Psychiatric  History: Unknown Social History: Patient is single and reports that he completed high school.  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  Additional Social History:                         Current Medications:  Current Facility-Administered Medications  Medication Dose Route Frequency Provider Last Rate Last Admin   acetaminophen (TYLENOL) tablet 650 mg  650 mg Oral Q6H PRN Onuoha, Chinwendu V, NP       alum & mag hydroxide-simeth (MAALOX/MYLANTA) 200-200-20 MG/5ML suspension 30 mL  30 mL Oral Q4H PRN Onuoha, Chinwendu V, NP       dicyclomine (BENTYL) tablet 20 mg  20 mg Oral Q6H PRN Onuoha, Chinwendu V, NP       haloperidol (HALDOL) tablet 5 mg  5 mg Oral TID PRN Onuoha, Chinwendu V, NP       And   diphenhydrAMINE (BENADRYL) capsule 50 mg  50 mg Oral TID PRN Onuoha, Chinwendu V, NP   50 mg at 09/02/23 0255   haloperidol lactate (HALDOL) injection 5 mg  5 mg Intramuscular TID PRN Onuoha, Chinwendu V, NP       And   diphenhydrAMINE (BENADRYL) injection 50 mg  50 mg Intramuscular TID PRN Onuoha, Chinwendu V, NP       And   LORazepam (  ATIVAN) injection 2 mg  2 mg Intramuscular TID PRN Onuoha, Chinwendu V, NP       haloperidol lactate (HALDOL) injection 10 mg  10 mg Intramuscular TID PRN Onuoha, Chinwendu V, NP       And   diphenhydrAMINE (BENADRYL) injection 50 mg  50 mg Intramuscular TID PRN Onuoha, Chinwendu V, NP       And   LORazepam (ATIVAN) injection 2 mg  2 mg Intramuscular TID PRN Onuoha, Chinwendu V, NP       haloperidol decanoate (HALDOL DECANOATE) 100 MG/ML injection 50 mg  50 mg Intramuscular Q28 days Rex Kras, MD   50 mg at 09/04/23 1325   hydrOXYzine (ATARAX) tablet 25 mg  25 mg Oral Q6H PRN Onuoha,  Chinwendu V, NP   25 mg at 09/04/23 2108   loperamide (IMODIUM) capsule 2-4 mg  2-4 mg Oral PRN Onuoha, Chinwendu V, NP       magnesium hydroxide (MILK OF MAGNESIA) suspension 30 mL  30 mL Oral Daily PRN Onuoha, Chinwendu V, NP       methocarbamol (ROBAXIN) tablet 500 mg  500 mg Oral Q8H PRN Onuoha, Chinwendu V, NP       ondansetron (ZOFRAN-ODT) disintegrating tablet 4 mg  4 mg Oral Q6H PRN Onuoha, Chinwendu V, NP       sertraline (ZOLOFT) tablet 50 mg  50 mg Oral Daily Rex Kras, MD   50 mg at 09/06/23 0910   traZODone (DESYREL) tablet 50 mg  50 mg Oral QHS PRN Onuoha, Chinwendu V, NP   50 mg at 09/05/23 2102   Current Outpatient Medications  Medication Sig Dispense Refill   haloperidol (HALDOL) 5 MG tablet Take 1 tablet (5 mg total) by mouth at bedtime for 10 days. 10 tablet 0   haloperidol decanoate (HALDOL DECANOATE) 100 MG/ML injection Inject 0.5 mLs (50 mg total) into the muscle every 30 (thirty) days for 1 dose. Next dose is due to be administerred on 08-10-23. 0.5 mL 0    Labs  Lab Results:  Admission on 09/01/2023, Discharged on 09/02/2023  Component Date Value Ref Range Status   WBC 09/01/2023 6.0  4.0 - 10.5 K/uL Final   RBC 09/01/2023 4.45  4.22 - 5.81 MIL/uL Final   Hemoglobin 09/01/2023 13.0  13.0 - 17.0 g/dL Final   HCT 16/04/9603 39.3  39.0 - 52.0 % Final   MCV 09/01/2023 88.3  80.0 - 100.0 fL Final   MCH 09/01/2023 29.2  26.0 - 34.0 pg Final   MCHC 09/01/2023 33.1  30.0 - 36.0 g/dL Final   RDW 54/03/8118 12.1  11.5 - 15.5 % Final   Platelets 09/01/2023 207  150 - 400 K/uL Final   nRBC 09/01/2023 0.0  0.0 - 0.2 % Final   Neutrophils Relative % 09/01/2023 66  % Final   Neutro Abs 09/01/2023 4.0  1.7 - 7.7 K/uL Final   Lymphocytes Relative 09/01/2023 14  % Final   Lymphs Abs 09/01/2023 0.8  0.7 - 4.0 K/uL Final   Monocytes Relative 09/01/2023 18  % Final   Monocytes Absolute 09/01/2023 1.1 (H)  0.1 - 1.0 K/uL Final   Eosinophils Relative 09/01/2023 1  % Final    Eosinophils Absolute 09/01/2023 0.1  0.0 - 0.5 K/uL Final   Basophils Relative 09/01/2023 1  % Final   Basophils Absolute 09/01/2023 0.0  0.0 - 0.1 K/uL Final   Immature Granulocytes 09/01/2023 0  % Final   Abs Immature Granulocytes 09/01/2023 0.02  0.00 -  0.07 K/uL Final   Performed at [facility], [address], [place], [place] [address]   Sodium 09/01/2023 134 (L)  135 - 145 mmol/L Final   Potassium 09/01/2023 3.5  3.5 - 5.1 mmol/L Final   Chloride 09/01/2023 101  98 - 111 mmol/L Final   CO2 09/01/2023 22  22 - 32 mmol/L Final   Glucose, Bld 09/01/2023 102 (H)  70 - 99 mg/dL Final   Glucose reference range applies only to samples taken after fasting for at least 8 hours.   BUN 09/01/2023 16  6 - 20 mg/dL Final   Creatinine, Ser 09/01/2023 0.92  0.61 - 1.24 mg/dL Final   Calcium 60/45/4098 8.8 (L)  8.9 - 10.3 mg/dL Final   Total Protein 11/91/4782 7.2  6.5 - 8.1 g/dL Final   Albumin 95/62/1308 4.3  3.5 - 5.0 g/dL Final   AST 65/78/4696 19  15 - 41 U/L Final   ALT 09/01/2023 14  0 - 44 U/L Final   Alkaline Phosphatase 09/01/2023 49  38 - 126 U/L Final   Total Bilirubin 09/01/2023 2.2 (H)  0.0 - 1.2 mg/dL Final   GFR, Estimated 09/01/2023 >60  >60 mL/min Final   Comment: (NOTE) Calculated using the CKD-EPI Creatinine Equation (2021)    Anion gap 09/01/2023 11  5 - 15 Final   Performed at [facility], [address] [place], [place] [address]   Alcohol, Ethyl (B) 09/01/2023 <10  <10 mg/dL Final   Comment: (NOTE) Lowest detectable limit for serum alcohol is 10 mg/dL.  For medical purposes only. Performed at [facility], [address] [place], [place] [address]    Acetaminophen (Tylenol), Serum 09/01/2023 <10 (L)  10 - 30 ug/mL Final   Comment: (NOTE) Therapeutic concentrations vary significantly. A range of 10-30 ug/mL  may be an effective concentration for many patients. However, some  are best treated at  concentrations outside of this range. Acetaminophen concentrations >150 ug/mL at 4 hours after ingestion  and >50 ug/mL at 12 hours after ingestion are often associated with  toxic reactions.  Performed at [facility], [address], [place], [place] [address]    Salicylate Lvl 09/01/2023 <7.0 (L)  7.0 - 30.0 mg/dL Final   Performed at [facility], [address] [place], [place] [address]   Opiates 09/01/2023 NONE DETECTED  NONE DETECTED Final   Cocaine 09/01/2023 NONE DETECTED  NONE DETECTED Final   Benzodiazepines 09/01/2023 NONE DETECTED  NONE DETECTED Final   Amphetamines 09/01/2023 NONE DETECTED  NONE DETECTED Final   Tetrahydrocannabinol 09/01/2023 POSITIVE (A)  NONE DETECTED Final   Barbiturates 09/01/2023 NONE DETECTED  NONE DETECTED Final   Comment: (NOTE) DRUG SCREEN FOR MEDICAL PURPOSES ONLY.  IF CONFIRMATION IS NEEDED FOR ANY PURPOSE, NOTIFY LAB WITHIN 5 DAYS.  LOWEST DETECTABLE LIMITS FOR URINE DRUG SCREEN Drug Class                     Cutoff (ng/mL) Amphetamine and metabolites    1000 Barbiturate and metabolites    200 Benzodiazepine                 200 Opiates and metabolites        300 Cocaine and metabolites        300 THC  50 Performed at [facility], [address], [place], [place] [address]   Admission on 07/07/2023, Discharged on 07/12/2023  Component Date Value Ref Range Status   Cholesterol 07/07/2023 117  0 - 200 mg/dL Final   Triglycerides 91/47/8295 22  <150 mg/dL Final   HDL 62/13/0865 56  >40 mg/dL Final   Total CHOL/HDL Ratio 07/07/2023 2.1  RATIO Final   VLDL 07/07/2023 4  0 - 40 mg/dL Final   LDL Cholesterol 07/07/2023 57  0 - 99 mg/dL Final   Comment:        Total Cholesterol/HDL:CHD Risk Coronary Heart Disease Risk Table                     Men   Women  1/2 Average Risk   3.4   3.3  Average Risk       5.0   4.4  2 X Average Risk   9.6   7.1  3 X  Average Risk  23.4   11.0        Use the calculated Patient Ratio above and the CHD Risk Table to determine the patient's CHD Risk.        ATP III CLASSIFICATION (LDL):  <100     mg/dL   Optimal  784-696  mg/dL   Near or Above                    Optimal  130-159  mg/dL   Borderline  295-284  mg/dL   High  >132     mg/dL   Very High Performed at [facility], [address], [place], [place] [address]    Hgb A1c MFr Bld 07/07/2023 5.1  4.8 - 5.6 % Final   Comment: (NOTE)         Prediabetes: 5.7 - 6.4         Diabetes: >6.4         Glycemic control for adults with diabetes: <7.0    Mean Plasma Glucose 07/07/2023 100  mg/dL Final   Comment: (NOTE) Performed At: [facility] [address] [place], [place] [id number] Jolene Schimke MD IH:[id number]    TSH 07/07/2023 2.114  0.350 - 4.500 uIU/mL Final   Comment: Performed by a 3rd Generation assay with a functional sensitivity of <=0.01 uIU/mL. Performed at [facility], [address], [place], [place] [address]   Admission on 07/06/2023, Discharged on 07/06/2023  Component Date Value Ref Range Status   WBC 07/06/2023 6.6  4.0 - 10.5 K/uL Final   RBC 07/06/2023 5.23  4.22 - 5.81 MIL/uL Final   Hemoglobin 07/06/2023 15.0  13.0 - 17.0 g/dL Final   HCT 32/95/1884 45.4  39.0 - 52.0 % Final   MCV 07/06/2023 86.8  80.0 - 100.0 fL Final   MCH 07/06/2023 28.7  26.0 - 34.0 pg Final   MCHC 07/06/2023 33.0  30.0 - 36.0 g/dL Final   RDW 16/60/6301 11.9  11.5 - 15.5 % Final   Platelets 07/06/2023 236  150 - 400 K/uL Final   nRBC 07/06/2023 0.0  0.0 - 0.2 % Final   Neutrophils Relative % 07/06/2023 59  % Final   Neutro Abs 07/06/2023 3.9  1.7 - 7.7 K/uL Final   Lymphocytes Relative 07/06/2023 27  % Final   Lymphs Abs 07/06/2023 1.8  0.7 - 4.0 K/uL Final   Monocytes Relative 07/06/2023 14  % Final   Monocytes Absolute 07/06/2023 0.9  0.1 - 1.0 K/uL Final   Eosinophils Relative 07/06/2023 0   % Final   Eosinophils Absolute 07/06/2023 0.0  0.0 - 0.5 K/uL Final   Basophils Relative 07/06/2023 0  % Final   Basophils Absolute 07/06/2023 0.0  0.0 - 0.1 K/uL Final   Immature Granulocytes 07/06/2023 0  % Final   Abs Immature Granulocytes 07/06/2023 0.01  0.00 - 0.07 K/uL Final   Performed at [facility], [address], [place], [place] [address]   Sodium 07/06/2023 136  135 - 145 mmol/L Final   Potassium 07/06/2023 3.3 (L)  3.5 - 5.1 mmol/L Final   Chloride 07/06/2023 101  98 - 111 mmol/L Final   CO2 07/06/2023 25  22 - 32 mmol/L Final   Glucose, Bld 07/06/2023 85  70 - 99 mg/dL Final   Glucose reference range applies only to samples taken after fasting for at least 8 hours.   BUN 07/06/2023 7  6 - 20 mg/dL Final   Creatinine, Ser 07/06/2023 1.03  0.61 - 1.24 mg/dL Final   Calcium 60/45/4098 9.6  8.9 - 10.3 mg/dL Final   Total Protein 11/91/4782 7.4  6.5 - 8.1 g/dL Final   Albumin 95/62/1308 4.6  3.5 - 5.0 g/dL Final   AST 65/78/4696 20  15 - 41 U/L Final   ALT 07/06/2023 18  0 - 44 U/L Final   Alkaline Phosphatase 07/06/2023 53  38 - 126 U/L Final   Total Bilirubin 07/06/2023 2.9 (H)  0.0 - 1.2 mg/dL Final   GFR, Estimated 07/06/2023 >60  >60 mL/min Final   Comment: (NOTE) Calculated using the CKD-EPI Creatinine Equation (2021)    Anion gap 07/06/2023 10  5 - 15 Final   Performed at [facility], [address] [place], [place] [address]   Alcohol, Ethyl (B) 07/06/2023 <10  <10 mg/dL Final   Comment: (NOTE) Lowest detectable limit for serum alcohol is 10 mg/dL.  For medical purposes only. Performed at [facility], [address] [place], [place] [address]    TSH 07/06/2023 3.214  0.350 - 4.500 uIU/mL Final   Comment: Performed by a 3rd Generation assay with a functional sensitivity of <=0.01 uIU/mL. Performed at [facility], [address], [place], [place] [address]    POC Amphetamine UR 07/06/2023 None Detected  NONE DETECTED (Cut Off  Level 1000 ng/mL) Final   POC Secobarbital (BAR) 07/06/2023 None Detected  NONE DETECTED (Cut Off Level 300 ng/mL) Final   POC Buprenorphine (BUP) 07/06/2023 None Detected  NONE DETECTED (Cut Off Level 10 ng/mL) Final   POC Oxazepam (BZO) 07/06/2023 None Detected  NONE DETECTED (Cut Off Level 300 ng/mL) Final   POC Cocaine UR 07/06/2023 None Detected  NONE DETECTED (Cut Off Level 300 ng/mL) Final   POC Methamphetamine UR 07/06/2023 None Detected  NONE DETECTED (Cut Off Level 1000 ng/mL) Final   POC Morphine 07/06/2023 None Detected  NONE DETECTED (Cut Off Level 300 ng/mL) Final   POC Methadone UR 07/06/2023 None Detected  NONE DETECTED (Cut Off Level 300 ng/mL) Final   POC Oxycodone UR 07/06/2023 None Detected  NONE DETECTED (Cut Off Level 100 ng/mL) Final   POC Marijuana UR 07/06/2023 None Detected  NONE DETECTED (Cut Off Level 50 ng/mL) Final    Blood Alcohol level:  Lab Results  Component Value Date   [facility] <10 09/01/2023   ETH <10 07/06/2023   Metabolic Disorder Labs: Lab Results  Component Value Date  HGBA1C 5.1 07/07/2023   MPG 100 07/07/2023   MPG 79.58 08/07/2021   No results found for: "PROLACTIN" Lab Results  Component Value Date   CHOL 117 07/07/2023   TRIG 22 07/07/2023   HDL 56 07/07/2023   CHOLHDL 2.1 07/07/2023   VLDL 4 07/07/2023   LDLCALC 57 07/07/2023   LDLCALC 78 08/07/2021    Therapeutic Lab Levels: No results found for: "LITHIUM" No results found for: "VALPROATE" No results found for: "CBMZ"  Physical Findings   AIMS    Flowsheet Row [facility]  AIMS Total Score 0      AUDIT    Flowsheet Row Admission (Discharged) [facility] Admission (Discharged) [facility]  Alcohol Use Disorder Identification Test Final Score (AUDIT) 8 0      GAD-7    Flowsheet Row Clinical  [facility]  Total GAD-7 Score 16      PHQ2-9    Flowsheet Row ED from 09/02/2023 in [facility] ED from 06/24/2022 in [facility] [place] ED from 10/22/2021 in [facility] at [facility] from 09/01/2021 in [facility]  PHQ-2 Total Score 5 4 4 4   PHQ-9 Total Score 18 11 17 11       Flowsheet Row ED [facility] 09/02/2023 in [facility] from 09/01/2023 in [facility] Admission (Discharged) [facility]  C-SSRS RISK CATEGORY No Risk No Risk High Risk        Musculoskeletal  Strength & Muscle Tone: within normal limits Gait & Station: normal Patient leans: N/A  Psychiatric Specialty Exam  Presentation General Appearance:Appropriate for Environment, Casual, Fairly Groomed Eye Contact:Good Speech:Clear and Coherent, Normal Rate (spontaneous) Volume:Normal Handedness:Right  Mood and Affect  Mood: ("good" somewhat anxious, but manageable) Affect:Appropriate, Congruent, Full Range (mildly anxious)  Thought Process  Thought Process:Coherent, Goal Directed, Linear Descriptions of Associations:Intact  Thought Content Suicidal Thoughts:No Homicidal Thoughts:No Hallucinations:None (Denied AVH) Ideas of Reference:None (Denied paranoia, did not make any delusional statements. Feels safe here) Thought Content:Logical, WDL (going home to mom's after dc. denied medication side effects currently, but did have a "locked jaw" feeling yesterday that has self resolved, no soreness as LAI site. denied cravings, no withdrawal sxs. is considering SIOP. no other questions or concerns)  Sensorium  Memory:Immediate Good Judgment:Fair Insight:Fair  Executive Functions  Orientation:Full (Time, Place and  Person) Language:Good Concentration:Good Attention:Good Recall:Good Fund of Knowledge:Good  Psychomotor Activity  Psychomotor Activity:Psychomotor Activity: Normal (No EPS or TD, AIMS 0)  Assets  Assets:Communication Skills, Desire for Improvement, Resilience  Sleep  Quality:Fair  Physical Exam  Physical Exam Constitutional:      Appearance: Normal appearance. He is normal weight.  HENT:     Head: Normocephalic.  Neurological:     General: No focal deficit present.     Mental Status: He is alert and oriented to person, place, and time. Mental status is at baseline.    Blood pressure 111/65, pulse 71, temperature 97.9 F (36.6 C), temperature source Oral, resp. rate 19, SpO2 98%. There is no height or weight on file to calculate BMI.  Treatment Plan Summary: Daily contact with patient to assess and evaluate symptoms and progress in treatment, Medication management, and  Plan the plan is to restart his home medications.  Continue with detox as indicated.  We will closely monitor his symptoms and assess his depression and consider outpatient rehab. Total duration of encounter: 4 days   Patient was logical and linear, no sxs of psychosis. Patient with stable mood and euthymic today. No med side effects, AIMS 0, no EPS day after receiving LAI. Residential rehab placement pending. Denies SI or HI.  His medications included: Continued Seroquel 25 mg at bedtime Continued Trazodone 50 mg at bedtime as needed Haldol Decanoate 50 mg IM received 09/04/2023 Continue Zoloft 50 mg a day  Miguel Rota, MD 09/06/2023 2:29 PM

## 2023-09-06 NOTE — ED Notes (Signed)
 Patient is sleeping. Respirations equal and unlabored, skin warm and dry. No change in assessment or acuity. Routine safety checks conducted according to facility protocol. Will continue to monitor for safety.

## 2023-09-06 NOTE — Discharge Instructions (Signed)
 Patient has been accepted to [facility] in [place], [place]; Transportation has been arranged via [employer] for 12:00pm on 09/10/2023. Address: [address] [place], [place] [address]; Number is [phone]  [facility] [address], [place], [address] [phone] phone  New Patient Assessment/Therapy Walk-Ins:  Monday and Wednesday: 8 am until slots are full. Every 1st and 2nd Fridays of the month: 1 pm - 5 pm.  NO ASSESSMENT/THERAPY WALK-INS ON TUESDAYS OR THURSDAYS  New Patient Assessment/Medication Management Walk-Ins:  Monday - Friday:  8 am - 11 am.  For all walk-ins, we ask that you arrive by 7:30 am because patients will be seen in the order of arrival.  Availability is limited; therefore, you may not be seen on the same day that you walk-in.  Our goal is to serve and meet the needs of our community to the best of our [place] ability.  SUBSTANCE USE TREATMENT for Medicaid and State Funded/IPRS  [facility] [address], [place], [address] [phone] phone NOTE: [facility] is no longer offering IOP services.  Serves those who are low-income or have no insurance.  [facility] [address], [place], [place], [address] [phone] phone [phone] fax NOTE: Does have Substance Abuse-Intensive Outpatient Program [facility]) as well as transitional housing if eligible.  [facility] [address] [place], [place], [address] [phone] phone [phone] fax  [facility] [phone] [place], [place], [address] [phone] phone [phone] fax  HALFWAY HOUSES:  [facility] [phone]  [email or web address]  12 STEP PROGRAMS:  Alcoholics Anonymous of [place] [employer]  Narcotics Anonymous of [place] [employer]  Al-Anon of [place], [place]  www.[place]alanon.org/find-meetings.html  Nar-Anon https://nar-anon.org/find-a-meetin  List of Residential placements:   [facility] in [place]: [phone]  [facility]: [phone]  [place], [place] [phone]: Male and male facility; 30-day program: (uninsured and Medicaid such as Laurena Bering, [place], [place], partners)  [facility]: [phone]; men and women's facility; 28 days; Can have Medicaid tailored plan Tour manager or Partners)  [facility]: [phone] Karoline Caldwell or Larita Fife; 28 day program; must be fully detox; tailored Medicaid or no insurance  [address] in [place], [place]; [phone]; 28 day all males program; no insurance accepted  [facility] Referral in [place]: Gabriel Rung [phone] (no insurance or Medicaid only); 90 days; outpatient services but provide housing in apartments [place]  [facility] Admission: [phone]: Patient must complete phone screening for placement: [place], [place]; 6 month program; uninsured, Medicaid, and [employer].   [facility]: no insurance required; [phone]  [facility]: [phone]; Intake: Molly Maduro; Must fill out application online; Alecia Lemming Delay [phone] x [phone] [facility] in [place], [place]: [phone]; Admissions Coordinators Mr. Maurine Minister or Barron Alvine; 90 day program.  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 Free program; There is an initial $300 fee however,  they are willing to work with patients regarding that. Application is online.  [facility]: Admissions [phone] Doran Heater ext [phone]; Any 7-90 day program is out of pocket; 12 month program is free of charge; there is a $275 entry fee; Patient is responsible for own transportation

## 2023-09-06 NOTE — ED Notes (Signed)
 Patient resting with eyes closed in no apparent acute distress. Respirations even and unlabored. Environment secured. Safety checks in place according to facility policy.

## 2023-09-07 DIAGNOSIS — F1510 Other stimulant abuse, uncomplicated: Secondary | ICD-10-CM | POA: Diagnosis not present

## 2023-09-07 NOTE — Group Note (Signed)
 Group Topic: Relapse and Recovery  Group Date: 09/07/2023 Start Time: 1730 End Time: 1800 Facilitators: Jenean Lindau, RN  [facility]  Number of Participants: 4  Group Focus: affirmation, chemical dependency education, chemical dependency issues, and coping skills Treatment Modality:  Behavior Modification Therapy Interventions utilized were clarification, group exercise, patient education, and problem solving Purpose: enhance coping skills, explore maladaptive thinking, express feelings, express irrational fears, improve communication skills, increase insight, regain self-worth, reinforce self-care, relapse prevention strategies, and trigger / craving management  Name: Jerry Garrison Date of Birth: 07/02/2001  MR: [id number]    Level of Participation: active Quality of Participation: attentive and cooperative Interactions with others: gave feedback Mood/Affect: appropriate Triggers (if applicable):   Cognition: coherent/clear Progress: Gaining insight Response:   Plan: follow-up needed  Patients Problems:  Patient Active Problem List   Diagnosis Date Noted   Methamphetamine use disorder, severe, dependence (HCC) 09/02/2023   Schizophrenia, unspecified (HCC) 01/27/2022   Tobacco use disorder 01/27/2022   Cannabis use disorder 01/27/2022   Accidental drug overdose    Overdose of opiate or related narcotic, accidental or unintentional, initial encounter (HCC) 01/25/2022   Acute respiratory failure with hypoxia (HCC) 01/25/2022   Aspiration pneumonia (HCC) 01/25/2022   Transaminitis 01/25/2022   Amphetamine use disorder, severe (HCC)    Schizo affective schizophrenia (HCC) 09/01/2021   Schizophrenia (HCC) 08/07/2021   Brief psychotic disorder (HCC) 01/06/2021   Threatening to others 12/12/2020

## 2023-09-07 NOTE — ED Notes (Signed)
 Patient was provided dinner

## 2023-09-07 NOTE — ED Notes (Signed)
 Patient was provided lunch

## 2023-09-07 NOTE — ED Provider Notes (Signed)
 Behavioral Health Progress Note  Date and Time: 09/07/2023 8:28 AM Name: Jerry Garrison MRN:  [id number]  Reason for admission   Jerry Garrison is a 23 y.o. male with prior psychiatric history of schizophrenia v schizoaffective disorder v SIPD and methamphetamine use d/o who arrived to [facility] as a direct admit to [facility] (09/02/2023) from [facility] with complaints of paranoia and methamphetamine abuse.  Patient is seeking detox. On admission, reported that he has had at least 5 ED visits and 1 inpatient psychiatric hospitalization in the past 6 months.  He was recently admitted to [facility] between 07/07/2023 to 07/12/2023 for worsening psychosis.  Information obtained during interview   Patient evaluated at bedside. Reports sleep has been good. Reports appetite has been good. States mood is "calm" today. Pt reports last use of methamphetamine 1 wk before admission to [facility]. Reports goals for today include "learning coping skills to cope with stress". Cravings are none. On interview, suicidal ideations are not present. Thoughts of self harm are not present. Homicidal ideations are not present. There are no auditory hallucinations, visual hallucinations, paranoid ideations, or delusional thought processes. Side effects to currently prescribed medications are none, reports he is tolerating his zoloft. There are no somatic complaints. Reports regular bowel movements.. There are no withdrawals reported today.    Per LSCW note - patient will need Hep B & C, HIV, and TB testing in order to be considered for [facility]. Patient has declined moving forward with applying here since he would be expected to cut off his dreadlocks.   Review of Systems  Constitutional:  Negative for malaise/fatigue.  HENT:  Negative for congestion.   Respiratory:  Negative for shortness of breath.   Cardiovascular:  Negative for chest pain.  Gastrointestinal:  Negative for abdominal pain, nausea and vomiting.   Genitourinary: Negative.   Musculoskeletal: Negative.        A lock jaw last night that resolved  Neurological:  Negative for dizziness, tremors and headaches.    Diagnosis:  Final diagnoses:  Methamphetamine abuse (HCC)  Polysubstance abuse (HCC)  Substance induced mood disorder (HCC)  Non compliance w medication regimen   Total Time spent with patient: 30 minutes  Past Psychiatric History: Past psychiatric history significant for history of at least 1 hospitalization to [facility] for psychosis.  He also has a history of methamphetamine and marijuana abuse. Past Medical History: None noted Family History: Unknown Family Psychiatric  History: Unknown Social History: Patient is single and reports that he completed high school.  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  Additional Social History:                         Current Medications:  Current Facility-Administered Medications  Medication Dose Route Frequency Provider Last Rate Last Admin   acetaminophen (TYLENOL) tablet 650 mg  650 mg Oral Q6H PRN Onuoha, Chinwendu V, NP       alum & mag hydroxide-simeth (MAALOX/MYLANTA) 200-200-20 MG/5ML suspension 30 mL  30 mL Oral Q4H PRN Onuoha, Chinwendu V, NP       haloperidol (HALDOL) tablet 5 mg  5 mg Oral TID PRN Onuoha, Chinwendu V, NP       And   diphenhydrAMINE (BENADRYL) capsule 50 mg  50 mg Oral TID PRN Onuoha, Chinwendu V, NP   50 mg at 09/02/23 0255   haloperidol lactate (HALDOL)  injection 5 mg  5 mg Intramuscular TID PRN Onuoha, Chinwendu V, NP       And   diphenhydrAMINE (BENADRYL) injection 50 mg  50 mg Intramuscular TID PRN Onuoha, Chinwendu V, NP       And   LORazepam (ATIVAN) injection 2 mg  2 mg Intramuscular TID PRN Onuoha, Chinwendu V, NP       haloperidol lactate (HALDOL) injection 10 mg  10 mg Intramuscular TID PRN Onuoha, Chinwendu V, NP       And   diphenhydrAMINE (BENADRYL) injection 50 mg  50 mg Intramuscular  TID PRN Onuoha, Chinwendu V, NP       And   LORazepam (ATIVAN) injection 2 mg  2 mg Intramuscular TID PRN Onuoha, Chinwendu V, NP       haloperidol decanoate (HALDOL DECANOATE) 100 MG/ML injection 50 mg  50 mg Intramuscular Q28 days Rex Kras, MD   50 mg at 09/04/23 1325   magnesium hydroxide (MILK OF MAGNESIA) suspension 30 mL  30 mL Oral Daily PRN Onuoha, Chinwendu V, NP       sertraline (ZOLOFT) tablet 50 mg  50 mg Oral Daily Rex Kras, MD   50 mg at 09/06/23 0910   traZODone (DESYREL) tablet 50 mg  50 mg Oral QHS PRN Onuoha, Chinwendu V, NP   50 mg at 09/06/23 2100   Current Outpatient Medications  Medication Sig Dispense Refill   haloperidol (HALDOL) 5 MG tablet Take 1 tablet (5 mg total) by mouth at bedtime for 10 days. 10 tablet 0   haloperidol decanoate (HALDOL DECANOATE) 100 MG/ML injection Inject 0.5 mLs (50 mg total) into the muscle every 30 (thirty) days for 1 dose. Next dose is due to be administerred on 08-10-23. 0.5 mL 0    Labs  Lab Results:  Admission on 09/01/2023, Discharged on 09/02/2023  Component Date Value Ref Range Status   WBC 09/01/2023 6.0  4.0 - 10.5 K/uL Final   RBC 09/01/2023 4.45  4.22 - 5.81 MIL/uL Final   Hemoglobin 09/01/2023 13.0  13.0 - 17.0 g/dL Final   HCT 95/28/4132 39.3  39.0 - 52.0 % Final   MCV 09/01/2023 88.3  80.0 - 100.0 fL Final   MCH 09/01/2023 29.2  26.0 - 34.0 pg Final   MCHC 09/01/2023 33.1  30.0 - 36.0 g/dL Final   RDW 44/07/270 12.1  11.5 - 15.5 % Final   Platelets 09/01/2023 207  150 - 400 K/uL Final   nRBC 09/01/2023 0.0  0.0 - 0.2 % Final   Neutrophils Relative % 09/01/2023 66  % Final   Neutro Abs 09/01/2023 4.0  1.7 - 7.7 K/uL Final   Lymphocytes Relative 09/01/2023 14  % Final   Lymphs Abs 09/01/2023 0.8  0.7 - 4.0 K/uL Final   Monocytes Relative 09/01/2023 18  % Final   Monocytes Absolute 09/01/2023 1.1 (H)  0.1 - 1.0 K/uL Final   Eosinophils Relative 09/01/2023 1  % Final   Eosinophils Absolute 09/01/2023 0.1   0.0 - 0.5 K/uL Final   Basophils Relative 09/01/2023 1  % Final   Basophils Absolute 09/01/2023 0.0  0.0 - 0.1 K/uL Final   Immature Granulocytes 09/01/2023 0  % Final   Abs Immature Granulocytes 09/01/2023 0.02  0.00 - 0.07 K/uL Final   Performed at [facility], [address], [place], [place] [address]   Sodium 09/01/2023 134 (L)  135 - 145 mmol/L Final   Potassium 09/01/2023 3.5  3.5 - 5.1  mmol/L Final   Chloride 09/01/2023 101  98 - 111 mmol/L Final   CO2 09/01/2023 22  22 - 32 mmol/L Final   Glucose, Bld 09/01/2023 102 (H)  70 - 99 mg/dL Final   Glucose reference range applies only to samples taken after fasting for at least 8 hours.   BUN 09/01/2023 16  6 - 20 mg/dL Final   Creatinine, Ser 09/01/2023 0.92  0.61 - 1.24 mg/dL Final   Calcium 40/98/1191 8.8 (L)  8.9 - 10.3 mg/dL Final   Total Protein 47/82/9562 7.2  6.5 - 8.1 g/dL Final   Albumin 13/02/6577 4.3  3.5 - 5.0 g/dL Final   AST 46/96/2952 19  15 - 41 U/L Final   ALT 09/01/2023 14  0 - 44 U/L Final   Alkaline Phosphatase 09/01/2023 49  38 - 126 U/L Final   Total Bilirubin 09/01/2023 2.2 (H)  0.0 - 1.2 mg/dL Final   GFR, Estimated 09/01/2023 >60  >60 mL/min Final   Comment: (NOTE) Calculated using the CKD-EPI Creatinine Equation (2021)    Anion gap 09/01/2023 11  5 - 15 Final   Performed at [facility], [address], [place], [place] [address]   Alcohol, Ethyl (B) 09/01/2023 <10  <10 mg/dL Final   Comment: (NOTE) Lowest detectable limit for serum alcohol is 10 mg/dL.  For medical purposes only. Performed at [facility], [address], [place], [place] [address]    Acetaminophen (Tylenol), Serum 09/01/2023 <10 (L)  10 - 30 ug/mL Final   Comment: (NOTE) Therapeutic concentrations vary significantly. A range of 10-30 ug/mL  may be an effective concentration for many patients. However, some  are best treated at concentrations outside of this  range. Acetaminophen concentrations >150 ug/mL at 4 hours after ingestion  and >50 ug/mL at 12 hours after ingestion are often associated with  toxic reactions.  Performed at [facility], [address] [place], [place] [address]    Salicylate Lvl 09/01/2023 <7.0 (L)  7.0 - 30.0 mg/dL Final   Performed at [facility], [address] [place], [place] [address]   Opiates 09/01/2023 NONE DETECTED  NONE DETECTED Final   Cocaine 09/01/2023 NONE DETECTED  NONE DETECTED Final   Benzodiazepines 09/01/2023 NONE DETECTED  NONE DETECTED Final   Amphetamines 09/01/2023 NONE DETECTED  NONE DETECTED Final   Tetrahydrocannabinol 09/01/2023 POSITIVE (A)  NONE DETECTED Final   Barbiturates 09/01/2023 NONE DETECTED  NONE DETECTED Final   Comment: (NOTE) DRUG SCREEN FOR MEDICAL PURPOSES ONLY.  IF CONFIRMATION IS NEEDED FOR ANY PURPOSE, NOTIFY LAB WITHIN 5 DAYS.  LOWEST DETECTABLE LIMITS FOR URINE DRUG SCREEN Drug Class                     Cutoff (ng/mL) Amphetamine and metabolites    1000 Barbiturate and metabolites    200 Benzodiazepine                 200 Opiates and metabolites        300 Cocaine and metabolites        300 THC                            50 Performed at [facility], [address], [place], [place] [address]   Admission on 07/07/2023, Discharged on 07/12/2023  Component Date Value Ref Range Status   Cholesterol 07/07/2023 117  0 -  200 mg/dL Final   Triglycerides 02/72/5366 22  <150 mg/dL Final   HDL 44/09/4740 56  >40 mg/dL Final   Total CHOL/HDL Ratio 07/07/2023 2.1  RATIO Final   VLDL 07/07/2023 4  0 - 40 mg/dL Final   LDL Cholesterol 07/07/2023 57  0 - 99 mg/dL Final   Comment:        Total Cholesterol/HDL:CHD Risk Coronary Heart Disease Risk Table                     Men   Women  1/2 Average Risk   3.4   3.3  Average Risk       5.0   4.4  2 X Average Risk   9.6   7.1  3 X Average Risk  23.4   11.0         Use the calculated Patient Ratio above and the CHD Risk Table to determine the patient's CHD Risk.        ATP III CLASSIFICATION (LDL):  <100     mg/dL   Optimal  595-638  mg/dL   Near or Above                    Optimal  130-159  mg/dL   Borderline  756-433  mg/dL   High  >295     mg/dL   Very High Performed at [facility], [address], [place], [place] [address]    Hgb A1c MFr Bld 07/07/2023 5.1  4.8 - 5.6 % Final   Comment: (NOTE)         Prediabetes: 5.7 - 6.4         Diabetes: >6.4         Glycemic control for adults with diabetes: <7.0    Mean Plasma Glucose 07/07/2023 100  mg/dL Final   Comment: (NOTE) Performed At: [facility] [address] [place], [place] [id number] Jolene Schimke MD FU:[id number]    TSH 07/07/2023 2.114  0.350 - 4.500 uIU/mL Final   Comment: Performed by a 3rd Generation assay with a functional sensitivity of <=0.01 uIU/mL. Performed at [facility], [address], [place], [place] [address]   Admission on 07/06/2023, Discharged on 07/06/2023  Component Date Value Ref Range Status   WBC 07/06/2023 6.6  4.0 - 10.5 K/uL Final   RBC 07/06/2023 5.23  4.22 - 5.81 MIL/uL Final   Hemoglobin 07/06/2023 15.0  13.0 - 17.0 g/dL Final   HCT 70/62/3762 45.4  39.0 - 52.0 % Final   MCV 07/06/2023 86.8  80.0 - 100.0 fL Final   MCH 07/06/2023 28.7  26.0 - 34.0 pg Final   MCHC 07/06/2023 33.0  30.0 - 36.0 g/dL Final   RDW 83/15/1761 11.9  11.5 - 15.5 % Final   Platelets 07/06/2023 236  150 - 400 K/uL Final   nRBC 07/06/2023 0.0  0.0 - 0.2 % Final   Neutrophils Relative % 07/06/2023 59  % Final   Neutro Abs 07/06/2023 3.9  1.7 - 7.7 K/uL Final   Lymphocytes Relative 07/06/2023 27  % Final   Lymphs Abs 07/06/2023 1.8  0.7 - 4.0 K/uL Final   Monocytes Relative 07/06/2023 14  % Final   Monocytes Absolute 07/06/2023 0.9  0.1 - 1.0 K/uL Final   Eosinophils Relative 07/06/2023 0  % Final   Eosinophils Absolute  07/06/2023 0.0  0.0 - 0.5 K/uL Final   Basophils Relative 07/06/2023 0  % Final  Basophils Absolute 07/06/2023 0.0  0.0 - 0.1 K/uL Final   Immature Granulocytes 07/06/2023 0  % Final   Abs Immature Granulocytes 07/06/2023 0.01  0.00 - 0.07 K/uL Final   Performed at [facility], [address], [place], [place] [address]   Sodium 07/06/2023 136  135 - 145 mmol/L Final   Potassium 07/06/2023 3.3 (L)  3.5 - 5.1 mmol/L Final   Chloride 07/06/2023 101  98 - 111 mmol/L Final   CO2 07/06/2023 25  22 - 32 mmol/L Final   Glucose, Bld 07/06/2023 85  70 - 99 mg/dL Final   Glucose reference range applies only to samples taken after fasting for at least 8 hours.   BUN 07/06/2023 7  6 - 20 mg/dL Final   Creatinine, Ser 07/06/2023 1.03  0.61 - 1.24 mg/dL Final   Calcium 07/08/7251 9.6  8.9 - 10.3 mg/dL Final   Total Protein 66/44/0347 7.4  6.5 - 8.1 g/dL Final   Albumin 42/59/5638 4.6  3.5 - 5.0 g/dL Final   AST 75/64/3329 20  15 - 41 U/L Final   ALT 07/06/2023 18  0 - 44 U/L Final   Alkaline Phosphatase 07/06/2023 53  38 - 126 U/L Final   Total Bilirubin 07/06/2023 2.9 (H)  0.0 - 1.2 mg/dL Final   GFR, Estimated 07/06/2023 >60  >60 mL/min Final   Comment: (NOTE) Calculated using the CKD-EPI Creatinine Equation (2021)    Anion gap 07/06/2023 10  5 - 15 Final   Performed at [facility], [address], [place], [place] [address]   Alcohol, Ethyl (B) 07/06/2023 <10  <10 mg/dL Final   Comment: (NOTE) Lowest detectable limit for serum alcohol is 10 mg/dL.  For medical purposes only. Performed at [facility], [address] [place], [place] [address]    TSH 07/06/2023 3.214  0.350 - 4.500 uIU/mL Final   Comment: Performed by a 3rd Generation assay with a functional sensitivity of <=0.01 uIU/mL. Performed at [facility], [address], [place], [place] [address]    POC Amphetamine UR 07/06/2023 None Detected  NONE DETECTED (Cut Off Level 1000 ng/mL) Final   POC  Secobarbital (BAR) 07/06/2023 None Detected  NONE DETECTED (Cut Off Level 300 ng/mL) Final   POC Buprenorphine (BUP) 07/06/2023 None Detected  NONE DETECTED (Cut Off Level 10 ng/mL) Final   POC Oxazepam (BZO) 07/06/2023 None Detected  NONE DETECTED (Cut Off Level 300 ng/mL) Final   POC Cocaine UR 07/06/2023 None Detected  NONE DETECTED (Cut Off Level 300 ng/mL) Final   POC Methamphetamine UR 07/06/2023 None Detected  NONE DETECTED (Cut Off Level 1000 ng/mL) Final   POC Morphine 07/06/2023 None Detected  NONE DETECTED (Cut Off Level 300 ng/mL) Final   POC Methadone UR 07/06/2023 None Detected  NONE DETECTED (Cut Off Level 300 ng/mL) Final   POC Oxycodone UR 07/06/2023 None Detected  NONE DETECTED (Cut Off Level 100 ng/mL) Final   POC Marijuana UR 07/06/2023 None Detected  NONE DETECTED (Cut Off Level 50 ng/mL) Final    Blood Alcohol level:  Lab Results  Component Value Date   ETH <10 09/01/2023   ETH <10 07/06/2023   Metabolic Disorder Labs: Lab Results  Component Value Date   HGBA1C 5.1 07/07/2023   MPG 100 07/07/2023   MPG 79.58 08/07/2021   No results found for: "PROLACTIN" Lab Results  Component Value Date   CHOL 117 07/07/2023   TRIG 22 07/07/2023  HDL 56 07/07/2023   CHOLHDL 2.1 07/07/2023   VLDL 4 07/07/2023   LDLCALC 57 07/07/2023   LDLCALC 78 08/07/2021    Therapeutic Lab Levels: No results found for: "LITHIUM" No results found for: "VALPROATE" No results found for: "CBMZ"  Physical Findings   AIMS    Flowsheet Row [facility]  AIMS Total Score 0      AUDIT    Flowsheet Row Ad[facility] (Discharged[facility] Admission (Discharged[facility]  Alcohol Use Disorder Identification Test Final Score (AUDIT) 8 0      GAD-7    Flowsheet Row Clinical Support from 09/01/2021 in  [facility]  Total GAD-7 Score 16      PHQ2-9    Flowsheet Row ED from 09/02/2023 in [facility] ED from 06/24/2022 in [facility] ED from 10/22/2021 in [facility] Department at [facility] Counselor from 09/01/2021 in [facility]  PHQ-2 Total Score 5 4 4 4   PHQ-9 Total Score 18 11 17 11       Flowsheet Row ED from 09/02/2023 in [facility] from 09/01/2023 in [facility] Emergency Department at [facility] Admission (Discharged) [facility]  C-SSRS RISK CATEGORY No Risk No Risk High Risk        Musculoskeletal  Strength & Muscle Tone: within normal limits Gait & Station: normal Patient leans: N/A  Psychiatric Specialty Exam  Presentation General Appearance:Appropriate for Environment, Casual, Fairly Groomed Eye Contact:Good Speech:Clear and Coherent, Normal Rate (spontaneous) Volume:Normal Handedness:Right  Mood and Affect  Mood: ("good" somewhat anxious, but manageable) Affect:Appropriate, Congruent, Full Range (mildly anxious)  Thought Process  Thought Process:Coherent, Goal Directed, Linear Descriptions of Associations:Intact  Thought Content Suicidal Thoughts:No Homicidal Thoughts:No Hallucinations:None (Denied AVH) Ideas of Reference:None (Denied paranoia, did not make any delusional statements. Feels safe here) Thought Content:Logical, WDL (going home to mom's after dc. denied medication side effects currently, but did have a "locked jaw" feeling yesterday that has self resolved, no soreness as LAI site. denied cravings, no withdrawal sxs. is considering SIOP. no other questions or concerns)  Sensorium  Memory:Immediate Good Judgment:Fair Insight:Fair  Executive Functions  Orientation:Full (Time, Place and  Person) Language:Good Concentration:Good Attention:Good Recall:Good Fund of Knowledge:Good  Psychomotor Activity  Psychomotor Activity:Psychomotor Activity: Normal (No EPS or TD, AIMS 0)  Assets  Assets:Communication Skills, Desire for Improvement, Resilience  Sleep  Quality:Fair  Physical Exam  Physical Exam Constitutional:      Appearance: Normal appearance. He is normal weight.  HENT:     Head: Normocephalic.  Neurological:     General: No focal deficit present.     Mental Status: He is alert and oriented to person, place, and time. Mental status is at baseline.    Blood pressure 108/69, pulse 87, temperature 97.9 F (36.6 C), temperature source Oral, resp. rate 18, SpO2 99%. There is no height or weight on file to calculate BMI.  Treatment Plan Summary: Daily contact with patient to assess and evaluate symptoms and progress in treatment, Medication management, and Plan the plan is to restart his home medications.  Continue with detox as indicated.  We will closely monitor his symptoms and assess his depression and consider outpatient rehab. Total duration of encounter: 5 days  On assessment today, appears to be in good spirits. No overt withdrawal symptoms. Thought process linear and goal oriented, no evidence of psychosis.  Residential rehab placement pending. Denies SI or HI.  His medications included: Continued Seroquel 25 mg at bedtime Continued Trazodone 50 mg at bedtime as needed Haldol Decanoate 50 mg IM received 09/04/2023 Continue Zoloft 50 mg a day  Lorri Frederick, MD 09/07/2023 8:28 AM

## 2023-09-07 NOTE — Discharge Planning (Addendum)
 LCSW received update from [facility] that they are out of network with the patient's insurance. LCSW received update from patient that he does not want to consider [facility] because they are requesting that he cut his locs off in order to be admitted. Brief supportive counseling was provided to the patient and he was receptive and hopefully for further placement. LCSW is continuing the bed search for placement. Referrals have been sent to [facility] and [facility] in [place] for review. LCSW will also explore agencies provided by [facility]: [facility] in the [employer] and [facility] in [place]. Updates to be provided as received.   LCSW will also follow up with mother to provide an update.   Fernande Boyden, LCSW Clinical Social Worker [facility] Ph: [phone]

## 2023-09-07 NOTE — ED Notes (Signed)
 Patient is sleeping. Respirations equal and unlabored, skin warm and dry. No change in assessment or acuity. Routine safety checks conducted according to facility protocol. Will continue to monitor for safety.

## 2023-09-07 NOTE — ED Notes (Signed)
 Pt is in the dayroom watching TV with peers. Pt denies SI/HI/AVH. Pt has no further complain.No acute distress noted. Will continue to monitor for safety and provide support.

## 2023-09-07 NOTE — Group Note (Signed)
 Group Topic: Positive Affirmations  Group Date: 09/07/2023 Start Time: 1230 End Time: 1320 Facilitators: Rayvon Char, Donata Clay  Department: [facility]  Number of Participants: 3  Group Focus: affirmation Treatment Modality:  Psychoeducation Interventions utilized were patient education Purpose: increase insight  Name: Jerry Garrison Date of Birth: 2001-01-27  MR: [id number]    Level of Participation: active Quality of Participation: attentive, cooperative, engaged, motivated, and offered feedback Interactions with others: gave feedback Mood/Affect: appropriate, bright, and positive Triggers (if applicable): N/A Cognition: coherent/clear, concrete, goal directed, and insightful Progress: Gaining insight Response: Patient was asked to pick an affirmation/empowerment card that he feels resonates with him. Patient chose "Everything I do today creates a better tomorrow" Patient was asked what are his goals for today and how can he set himself up for success this week" Patient explained that calling to setup further placement is a goal that he is working on and believes that it will create a long term success for him in the future. 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  Plan: patient will be encouraged to continue to attend group  Patients Problems:  Patient Active Problem List   Diagnosis Date Noted   Methamphetamine use disorder, severe, dependence (HCC) 09/02/2023   Schizophrenia, unspecified (HCC) 01/27/2022   Tobacco use disorder 01/27/2022   Cannabis use disorder 01/27/2022   Accidental drug overdose    Overdose of opiate or related narcotic, accidental or unintentional, initial encounter (HCC) 01/25/2022   Acute respiratory failure with hypoxia (HCC) 01/25/2022   Aspiration pneumonia (HCC) 01/25/2022    Transaminitis 01/25/2022   Amphetamine use disorder, severe (HCC)    Schizo affective schizophrenia (HCC) 09/01/2021   Schizophrenia (HCC) 08/07/2021   Brief psychotic disorder (HCC) 01/06/2021   Threatening to others 12/12/2020

## 2023-09-07 NOTE — ED Notes (Signed)
 Pt currently in day room, socializing w/ others and watching TV, calm, pleasant. Denies need of anything at this time. Will continue to monitor and provide support as needed.

## 2023-09-07 NOTE — Group Note (Signed)
 Group Topic: Healthy Self Image and Positive Change  Group Date: 09/07/2023 Start Time: 2030 End Time: 2100 Facilitators: Lauro Jahron Hunsinger, NT  Department: [facility]  Number of Participants: 2  Group Focus: acceptance and affirmation Treatment Modality:  Cognitive Behavioral Therapy Interventions utilized were leisure development and support Purpose: express feelings, improve communication skills, and increase insight  Name: Robertlee Rogacki Date of Birth: 2001/06/23  MR: [id number]    Level of Participation: active Quality of Participation: cooperative Interactions with others: gave feedback Mood/Affect: appropriate Triggers (if applicable): N/A Cognition: coherent/clear Progress: Gaining insight Response: N/A Plan: patient will be encouraged to attend group.  Patients Problems:  Patient Active Problem List   Diagnosis Date Noted   Methamphetamine use disorder, severe, dependence (HCC) 09/02/2023   Schizophrenia, unspecified (HCC) 01/27/2022   Tobacco use disorder 01/27/2022   Cannabis use disorder 01/27/2022   Accidental drug overdose    Overdose of opiate or related narcotic, accidental or unintentional, initial encounter (HCC) 01/25/2022   Acute respiratory failure with hypoxia (HCC) 01/25/2022   Aspiration pneumonia (HCC) 01/25/2022   Transaminitis 01/25/2022   Amphetamine use disorder, severe (HCC)    Schizo affective schizophrenia (HCC) 09/01/2021   Schizophrenia (HCC) 08/07/2021   Brief psychotic disorder (HCC) 01/06/2021   Threatening to others 12/12/2020

## 2023-09-08 DIAGNOSIS — F1510 Other stimulant abuse, uncomplicated: Secondary | ICD-10-CM | POA: Diagnosis not present

## 2023-09-08 NOTE — ED Notes (Signed)
 Patient is sleeping. Respirations equal and unlabored, skin warm and dry. No change in assessment or acuity. Routine safety checks conducted according to facility protocol. Will continue to monitor for safety.

## 2023-09-08 NOTE — ED Notes (Signed)
 Pt is in the dayroom watching TV with peers. Pt denies SI/HI/AVH. Pt has no further complain.No acute distress noted. Will continue to monitor for safety and provide support.

## 2023-09-08 NOTE — Progress Notes (Signed)
Food given to pt.  

## 2023-09-08 NOTE — Group Note (Signed)
 Group Topic: Communication  Group Date: 09/08/2023 Start Time: 1200 End Time: 1230 Facilitators: Elenor Quinones, NT  Department: [facility]  Number of Participants: 4  Group Focus: social skills Treatment Modality:  Psychoeducation Interventions utilized were group exercise Purpose: enhance coping skills  Name: Jerry Garrison Date of Birth: [birth year]  MR: [id number]    Level of Participation: active Quality of Participation: attentive Interactions with others: gave feedback Mood/Affect: bright Triggers (if applicable): N/A Cognition: coherent/clear Progress: Moderate Response: One of the nicest things that happened to him is that his brother gave him $50 Plan: follow-up needed  Patients Problems:  Patient Active Problem List   Diagnosis Date Noted   Methamphetamine use disorder, severe, dependence (HCC) 09/02/2023   Schizophrenia, unspecified (HCC) 01/27/2022   Tobacco use disorder 01/27/2022   Cannabis use disorder 01/27/2022   Accidental drug overdose    Overdose of opiate or related narcotic, accidental or unintentional, initial encounter (HCC) 01/25/2022   Acute respiratory failure with hypoxia (HCC) 01/25/2022   Aspiration pneumonia (HCC) 01/25/2022   Transaminitis 01/25/2022   Amphetamine use disorder, severe (HCC)    Schizo affective schizophrenia (HCC) 09/01/2021   Schizophrenia (HCC) 08/07/2021   Brief psychotic disorder (HCC) 01/06/2021   Threatening to others 12/12/2020

## 2023-09-08 NOTE — Progress Notes (Signed)
Pt was visible in the milieu. No distress noted or concerns voiced. Staff will monitor for pt's safety.

## 2023-09-08 NOTE — Group Note (Signed)
 Group Topic: Relapse and Recovery  Group Date: 09/08/2023 Start Time: 1400 End Time: 1500 Facilitators: [name], RN  Department: [facility]  Number of Participants: 4  Group Focus: coping skills and relapse prevention Treatment Modality:  Psychoeducation Interventions utilized were group exercise, problem solving, and support Purpose: enhance coping skills, explore maladaptive thinking, and relapse prevention strategies  Name: Jerry Garrison Date of Birth: 12-24-2000  MR: [id number]    Level of Participation: active Quality of Participation: attentive, cooperative, and engaged Interactions with others: gave feedback Mood/Affect: appropriate Triggers (if applicable): none Cognition: coherent/clear, goal directed, and logical Progress: Gaining insight Response: Pt attended AA and participated. Plan: follow-up needed  Patients Problems:  Patient Active Problem List   Diagnosis Date Noted   Methamphetamine use disorder, severe, dependence (HCC) 09/02/2023   Schizophrenia, unspecified (HCC) 01/27/2022   Tobacco use disorder 01/27/2022   Cannabis use disorder 01/27/2022   Accidental drug overdose    Overdose of opiate or related narcotic, accidental or unintentional, initial encounter (HCC) 01/25/2022   Acute respiratory failure with hypoxia (HCC) 01/25/2022   Aspiration pneumonia (HCC) 01/25/2022   Transaminitis 01/25/2022   Amphetamine use disorder, severe (HCC)    Schizo affective schizophrenia (HCC) 09/01/2021   Schizophrenia (HCC) 08/07/2021   Brief psychotic disorder (HCC) 01/06/2021   Threatening to others 12/12/2020

## 2023-09-08 NOTE — ED Provider Notes (Signed)
 Behavioral Health Progress Note  Date and Time: 09/08/2023 2:53 PM Name: Jerry Garrison MRN:  [id number]  Reason for admission   Jerry Garrison is a 23 y.o. male with prior psychiatric history of schizophrenia v schizoaffective disorder v SIPD and methamphetamine use d/o who arrived to [facility] as a direct admit to [facility] (09/02/2023) from [facility] with complaints of paranoia and methamphetamine abuse.  Patient is seeking detox. On admission, reported that he has had at least 5 ED visits and 1 inpatient psychiatric hospitalization in the past 6 months.  He was recently admitted to [facility] between 07/07/2023 to 07/12/2023 for worsening psychosis.  Information obtained during interview   Patient evaluated at bedside. Patient euthymic and reports good sleep, appetite and mood. Denies AVH or paranoia. Patient is participating in groups and states he continues to be interested in inpatient rehab Pt reports last use of methamphetamine 1 wk before admission to [facility]. Denies SI or HI. Side effects to currently prescribed medications are none, reports he is tolerating his zoloft. There are no somatic complaints. Reports regular bowel movements.. There are no withdrawals reported today.    Per LSCW note - patient will need Hep B & C, HIV, and TB testing in order to be considered for [facility]. Patient has declined moving forward with applying here since he would be expected to cut off his dreadlocks.   Review of Systems  Constitutional:  Negative for malaise/fatigue.  HENT:  Negative for congestion.   Respiratory:  Negative for shortness of breath.   Cardiovascular:  Negative for chest pain.  Gastrointestinal:  Negative for abdominal pain, nausea and vomiting.  Genitourinary: Negative.   Musculoskeletal: Negative.        A lock jaw last night that resolved  Neurological:  Negative for dizziness, tremors and headaches.    Diagnosis:  Final diagnoses:  Methamphetamine abuse (HCC)   Polysubstance abuse (HCC)  Substance induced mood disorder (HCC)  Non compliance w medication regimen   Total Time spent with patient: 30 minutes  Past Psychiatric History: Past psychiatric history significant for history of at least 1 hospitalization to [facility] for psychosis.  He also has a history of methamphetamine and marijuana abuse. Past Medical History: None noted Family History: Unknown Family Psychiatric  History: Unknown Social History: Patient is single and reports that he completed high school.  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  Additional Social History:                         Current Medications:  Current Facility-Administered Medications  Medication Dose Route Frequency Provider Last Rate Last Admin   acetaminophen (TYLENOL) tablet 650 mg  650 mg Oral Q6H PRN Onuoha, Chinwendu V, NP       alum & mag hydroxide-simeth (MAALOX/MYLANTA) 200-200-20 MG/5ML suspension 30 mL  30 mL Oral Q4H PRN Onuoha, Chinwendu V, NP       haloperidol (HALDOL) tablet 5 mg  5 mg Oral TID PRN Onuoha, Chinwendu V, NP       And   diphenhydrAMINE (BENADRYL) capsule 50 mg  50 mg Oral TID PRN Onuoha, Chinwendu V, NP   50 mg at 09/02/23 0255   haloperidol lactate (HALDOL) injection 5 mg  5 mg Intramuscular TID PRN Onuoha, Chinwendu V, NP       And   diphenhydrAMINE (BENADRYL) injection 50 mg  50 mg  Intramuscular TID PRN Onuoha, Chinwendu V, NP       And   LORazepam (ATIVAN) injection 2 mg  2 mg Intramuscular TID PRN Onuoha, Chinwendu V, NP       haloperidol lactate (HALDOL) injection 10 mg  10 mg Intramuscular TID PRN Onuoha, Chinwendu V, NP       And   diphenhydrAMINE (BENADRYL) injection 50 mg  50 mg Intramuscular TID PRN Onuoha, Chinwendu V, NP       And   LORazepam (ATIVAN) injection 2 mg  2 mg Intramuscular TID PRN Onuoha, Chinwendu V, NP       haloperidol decanoate (HALDOL DECANOATE) 100 MG/ML injection 50 mg  50 mg Intramuscular Q28  days Rex Kras, MD   50 mg at 09/04/23 1325   magnesium hydroxide (MILK OF MAGNESIA) suspension 30 mL  30 mL Oral Daily PRN Onuoha, Chinwendu V, NP       sertraline (ZOLOFT) tablet 50 mg  50 mg Oral Daily Rex Kras, MD   50 mg at 09/08/23 0833   traZODone (DESYREL) tablet 50 mg  50 mg Oral QHS PRN Onuoha, Chinwendu V, NP   50 mg at 09/07/23 2119   Current Outpatient Medications  Medication Sig Dispense Refill   haloperidol (HALDOL) 5 MG tablet Take 1 tablet (5 mg total) by mouth at bedtime for 10 days. 10 tablet 0   haloperidol decanoate (HALDOL DECANOATE) 100 MG/ML injection Inject 0.5 mLs (50 mg total) into the muscle every 30 (thirty) days for 1 dose. Next dose is due to be administerred on 08-10-23. 0.5 mL 0    Labs  Lab Results:  Admission on 09/01/2023, Discharged on 09/02/2023  Component Date Value Ref Range Status   WBC 09/01/2023 6.0  4.0 - 10.5 K/uL Final   RBC 09/01/2023 4.45  4.22 - 5.81 MIL/uL Final   Hemoglobin 09/01/2023 13.0  13.0 - 17.0 g/dL Final   HCT 29/56/2130 39.3  39.0 - 52.0 % Final   MCV 09/01/2023 88.3  80.0 - 100.0 fL Final   MCH 09/01/2023 29.2  26.0 - 34.0 pg Final   MCHC 09/01/2023 33.1  30.0 - 36.0 g/dL Final   RDW 86/57/8469 12.1  11.5 - 15.5 % Final   Platelets 09/01/2023 207  150 - 400 K/uL Final   nRBC 09/01/2023 0.0  0.0 - 0.2 % Final   Neutrophils Relative % 09/01/2023 66  % Final   Neutro Abs 09/01/2023 4.0  1.7 - 7.7 K/uL Final   Lymphocytes Relative 09/01/2023 14  % Final   Lymphs Abs 09/01/2023 0.8  0.7 - 4.0 K/uL Final   Monocytes Relative 09/01/2023 18  % Final   Monocytes Absolute 09/01/2023 1.1 (H)  0.1 - 1.0 K/uL Final   Eosinophils Relative 09/01/2023 1  % Final   Eosinophils Absolute 09/01/2023 0.1  0.0 - 0.5 K/uL Final   Basophils Relative 09/01/2023 1  % Final   Basophils Absolute 09/01/2023 0.0  0.0 - 0.1 K/uL Final   Immature Granulocytes 09/01/2023 0  % Final   Abs Immature Granulocytes 09/01/2023 0.02  0.00 - 0.07  K/uL Final   Performed at [facility], [address], [place], [place] [address]   Sodium 09/01/2023 134 (L)  135 - 145 mmol/L Final   Potassium 09/01/2023 3.5  3.5 - 5.1 mmol/L Final   Chloride 09/01/2023 101  98 - 111 mmol/L Final   CO2 09/01/2023 22  22 - 32 mmol/L Final   Glucose, Bld 09/01/2023 102 (  H)  70 - 99 mg/dL Final   Glucose reference range applies only to samples taken after fasting for at least 8 hours.   BUN 09/01/2023 16  6 - 20 mg/dL Final   Creatinine, Ser 09/01/2023 0.92  0.61 - 1.24 mg/dL Final   Calcium 16/04/9603 8.8 (L)  8.9 - 10.3 mg/dL Final   Total Protein 54/03/8118 7.2  6.5 - 8.1 g/dL Final   Albumin 14/78/2956 4.3  3.5 - 5.0 g/dL Final   AST 21/30/8657 19  15 - 41 U/L Final   ALT 09/01/2023 14  0 - 44 U/L Final   Alkaline Phosphatase 09/01/2023 49  38 - 126 U/L Final   Total Bilirubin 09/01/2023 2.2 (H)  0.0 - 1.2 mg/dL Final   GFR, Estimated 09/01/2023 >60  >60 mL/min Final   Comment: (NOTE) Calculated using the CKD-EPI Creatinine Equation (2021)    Anion gap 09/01/2023 11  5 - 15 Final   Performed at [facility], [address] [place], [place] [address]   Alcohol, Ethyl (B) 09/01/2023 <10  <10 mg/dL Final   Comment: (NOTE) Lowest detectable limit for serum alcohol is 10 mg/dL.  For medical purposes only. Performed at [facility], [address] [place], [place] [address]    Acetaminophen (Tylenol), Serum 09/01/2023 <10 (L)  10 - 30 ug/mL Final   Comment: (NOTE) Therapeutic concentrations vary significantly. A range of 10-30 ug/mL  may be an effective concentration for many patients. However, some  are best treated at concentrations outside of this range. Acetaminophen concentrations >150 ug/mL at 4 hours after ingestion  and >50 ug/mL at 12 hours after ingestion are often associated with  toxic reactions.  Performed at [facility], [address], [place], [place] [address]    Salicylate Lvl 09/01/2023 <7.0 (L)  7.0 - 30.0 mg/dL Final   Performed at [facility], [address] [place], [place] [address]   Opiates 09/01/2023 NONE DETECTED  NONE DETECTED Final   Cocaine 09/01/2023 NONE DETECTED  NONE DETECTED Final   Benzodiazepines 09/01/2023 NONE DETECTED  NONE DETECTED Final   Amphetamines 09/01/2023 NONE DETECTED  NONE DETECTED Final   Tetrahydrocannabinol 09/01/2023 POSITIVE (A)  NONE DETECTED Final   Barbiturates 09/01/2023 NONE DETECTED  NONE DETECTED Final   Comment: (NOTE) DRUG SCREEN FOR MEDICAL PURPOSES ONLY.  IF CONFIRMATION IS NEEDED FOR ANY PURPOSE, NOTIFY LAB WITHIN 5 DAYS.  LOWEST DETECTABLE LIMITS FOR URINE DRUG SCREEN Drug Class                     Cutoff (ng/mL) Amphetamine and metabolites    1000 Barbiturate and metabolites    200 Benzodiazepine                 200 Opiates and metabolites        300 Cocaine and metabolites        300 THC                            50 Performed at [facility], [address] [place], [place] [address]   Admission on 07/07/2023, Discharged on 07/12/2023  Component Date Value Ref Range Status   Cholesterol 07/07/2023 117  0 - 200 mg/dL Final   Triglycerides 64/40/3474 22  <150 mg/dL Final   HDL 25/95/6387 56  >40 mg/dL Final   Total CHOL/HDL Ratio 07/07/2023 2.1  RATIO Final   VLDL 07/07/2023 4  0 - 40 mg/dL Final   LDL Cholesterol 07/07/2023 57  0 - 99 mg/dL Final   Comment:        Total Cholesterol/HDL:CHD Risk Coronary Heart Disease Risk Table                     Men   Women  1/2 Average Risk   3.4   3.3  Average Risk       5.0   4.4  2 X Average Risk   9.6   7.1  3 X Average Risk  23.4   11.0        Use the calculated Patient Ratio above and the CHD Risk Table to determine the patient's CHD Risk.        ATP III CLASSIFICATION (LDL):  <100     mg/dL   Optimal  161-096  mg/dL   Near or Above                     Optimal  130-159  mg/dL   Borderline  045-409  mg/dL   High  >811     mg/dL   Very High Performed at [facility], [address], [place], [place] [address]    Hgb A1c MFr Bld 07/07/2023 5.1  4.8 - 5.6 % Final   Comment: (NOTE)         Prediabetes: 5.7 - 6.4         Diabetes: >6.4         Glycemic control for adults with diabetes: <7.0    Mean Plasma Glucose 07/07/2023 100  mg/dL Final   Comment: (NOTE) Performed At: [facility] [address] [place], [place] [id number] Jolene Schimke MD MV:[id number]    TSH 07/07/2023 2.114  0.350 - 4.500 uIU/mL Final   Comment: Performed by a 3rd Generation assay with a functional sensitivity of <=0.01 uIU/mL. Performed at [facility], [address], [place], [place] [address]   Admission on 07/06/2023, Discharged on 07/06/2023  Component Date Value Ref Range Status   WBC 07/06/2023 6.6  4.0 - 10.5 K/uL Final   RBC 07/06/2023 5.23  4.22 - 5.81 MIL/uL Final   Hemoglobin 07/06/2023 15.0  13.0 - 17.0 g/dL Final   HCT 44/07/270 45.4  39.0 - 52.0 % Final   MCV 07/06/2023 86.8  80.0 - 100.0 fL Final   MCH 07/06/2023 28.7  26.0 - 34.0 pg Final   MCHC 07/06/2023 33.0  30.0 - 36.0 g/dL Final   RDW 53/66/4403 11.9  11.5 - 15.5 % Final   Platelets 07/06/2023 236  150 - 400 K/uL Final   nRBC 07/06/2023 0.0  0.0 - 0.2 % Final   Neutrophils Relative % 07/06/2023 59  % Final   Neutro Abs 07/06/2023 3.9  1.7 - 7.7 K/uL Final   Lymphocytes Relative 07/06/2023 27  % Final   Lymphs Abs 07/06/2023 1.8  0.7 - 4.0 K/uL Final   Monocytes Relative 07/06/2023 14  % Final   Monocytes Absolute 07/06/2023 0.9  0.1 - 1.0 K/uL Final   Eosinophils Relative 07/06/2023 0  % Final   Eosinophils Absolute 07/06/2023 0.0  0.0 - 0.5 K/uL Final   Basophils Relative 07/06/2023 0  % Final   Basophils Absolute 07/06/2023 0.0  0.0 - 0.1 K/uL Final   Immature Granulocytes 07/06/2023 0  % Final   Abs Immature Granulocytes  07/06/2023 0.01  0.00 - 0.07 K/uL Final   Performed at [facility], [address], [place], [place] [address]   Sodium 07/06/2023 136  135 - 145 mmol/L Final   Potassium 07/06/2023 3.3 (L)  3.5 - 5.1 mmol/L Final   Chloride 07/06/2023 101  98 - 111 mmol/L Final   CO2 07/06/2023 25  22 - 32 mmol/L Final   Glucose, Bld 07/06/2023 85  70 - 99 mg/dL Final   Glucose reference range applies only to samples taken after fasting for at least 8 hours.   BUN 07/06/2023 7  6 - 20 mg/dL Final   Creatinine, Ser 07/06/2023 1.03  0.61 - 1.24 mg/dL Final   Calcium 60/45/4098 9.6  8.9 - 10.3 mg/dL Final   Total Protein 11/91/4782 7.4  6.5 - 8.1 g/dL Final   Albumin 95/62/1308 4.6  3.5 - 5.0 g/dL Final   AST 65/78/4696 20  15 - 41 U/L Final   ALT 07/06/2023 18  0 - 44 U/L Final   Alkaline Phosphatase 07/06/2023 53  38 - 126 U/L Final   Total Bilirubin 07/06/2023 2.9 (H)  0.0 - 1.2 mg/dL Final   GFR, Estimated 07/06/2023 >60  >60 mL/min Final   Comment: (NOTE) Calculated using the CKD-EPI Creatinine Equation (2021)    Anion gap 07/06/2023 10  5 - 15 Final   Performed at [facility], [address] [place], [place] [address]   Alcohol, Ethyl (B) 07/06/2023 <10  <10 mg/dL Final   Comment: (NOTE) Lowest detectable limit for serum alcohol is 10 mg/dL.  For medical purposes only. Performed at [facility], [address], [place], [place] [address]    TSH 07/06/2023 3.214  0.350 - 4.500 uIU/mL Final   Comment: Performed by a 3rd Generation assay with a functional sensitivity of <=0.01 uIU/mL. Performed at [facility], [address] [place], [place] [address]    POC Amphetamine UR 07/06/2023 None Detected  NONE DETECTED (Cut Off Level 1000 ng/mL) Final   POC Secobarbital (BAR) 07/06/2023 None Detected  NONE DETECTED (Cut Off Level 300 ng/mL) Final   POC Buprenorphine (BUP) 07/06/2023 None Detected  NONE DETECTED (Cut Off Level 10 ng/mL) Final   POC Oxazepam (BZO) 07/06/2023  None Detected  NONE DETECTED (Cut Off Level 300 ng/mL) Final   POC Cocaine UR 07/06/2023 None Detected  NONE DETECTED (Cut Off Level 300 ng/mL) Final   POC Methamphetamine UR 07/06/2023 None Detected  NONE DETECTED (Cut Off Level 1000 ng/mL) Final   POC Morphine 07/06/2023 None Detected  NONE DETECTED (Cut Off Level 300 ng/mL) Final   POC Methadone UR 07/06/2023 None Detected  NONE DETECTED (Cut Off Level 300 ng/mL) Final   POC Oxycodone UR 07/06/2023 None Detected  NONE DETECTED (Cut Off Level 100 ng/mL) Final   POC Marijuana UR 07/06/2023 None Detected  NONE DETECTED (Cut Off Level 50 ng/mL) Final    Blood Alcohol level:  Lab Results  Component Value Date   ETH <10 09/01/2023   ETH <10 07/06/2023   Metabolic Disorder Labs: Lab Results  Component Value Date   HGBA1C 5.1 07/07/2023   MPG 100 07/07/2023   MPG 79.58 08/07/2021   No results found for: "PROLACTIN" Lab Results  Component Value Date   CHOL 117 07/07/2023   TRIG 22 07/07/2023   HDL 56 07/07/2023   CHOLHDL 2.1 07/07/2023   VLDL 4 07/07/2023   LDLCALC 57 07/07/2023   LDLCALC 78 08/07/2021  Therapeutic Lab Levels: No results found for: "LITHIUM" No results found for: "VALPROATE" No results found for: "CBMZ"  Physical Findings   AIMS    Flowsheet Row [facility]  AIMS Total Score 0      AUDIT    Flowsheet Row Ad[facility] (Dis[facility] Admission (Discharged[facility]  Alcohol Use Disorder Identification Test Final Score (AUDIT) 8 0      GAD-7    Flowsheet Row Clinical Support from 09/01/2021 in [facility]  Total GAD-7 Score 16      PHQ2-9    Flowsheet Row ED from 09/02/2023 in [facility] ED from 06/24/2022 in [facility] ED  from 10/22/2021 in [facility] Department at [facility] Counselor from 09/01/2021 in [facility]  PHQ-2 Total Score 5 4 4 4   PHQ-9 Total Score 18 11 17 11       Flowsheet Row ED from 09/02/2023 in [facility] ED from 09/01/2023 in [facility] Department at [facility] Admission (Discharged) [facility]  C-SSRS RISK CATEGORY No Risk No Risk High Risk        Musculoskeletal  Strength & Muscle Tone: within normal limits Gait & Station: normal Patient leans: N/A  Psychiatric Specialty Exam  Presentation General Appearance:Appropriate for Environment, Casual, Fairly Groomed Eye Contact:Good Speech:Clear and Coherent, Normal Rate (spontaneous) Volume:Normal Handedness:Right  Mood and Affect  Mood: ("good" somewhat anxious, but manageable) Affect:Appropriate, Congruent, Full Range (mildly anxious)  Thought Process  Thought Process:Coherent, Goal Directed, Linear Descriptions of Associations:Intact  Thought Content Suicidal Thoughts:No Homicidal Thoughts:No Hallucinations:None (Denied AVH) Ideas of Reference:None (Denied paranoia, did not make any delusional statements. Feels safe here) Thought Content:Logical, WDL (going home to mom's after dc. denied medication side effects currently, but did have a "locked jaw" feeling yesterday that has self resolved, no soreness as LAI site. denied cravings, no withdrawal sxs. is considering SIOP. no other questions or concerns)  Sensorium  Memory:Immediate Good Judgment:Fair Insight:Fair  Executive Functions  Orientation:Full (Time, Place and Person) Language:Good Concentration:Good Attention:Good Recall:Good Fund of Knowledge:Good  Psychomotor Activity  Psychomotor Activity:Psychomotor Activity: Normal (No EPS or TD, AIMS 0)  Assets  Assets:Communication Skills, Desire for Improvement,  Resilience  Sleep  Quality:Fair  Physical Exam  Physical Exam Constitutional:      Appearance: Normal appearance. He is normal weight.  HENT:     Head: Normocephalic.  Neurological:     General: No focal deficit present.     Mental Status: He is alert and oriented to person, place, and time. Mental status is at baseline.    Blood pressure 124/81, pulse 87, temperature 98 F (36.7 C), temperature source Oral, resp. rate 18, SpO2 98%. There is no height or weight on file to calculate BMI.  Treatment Plan Summary: Daily contact with patient to assess and evaluate symptoms and progress in treatment, Medication management, and Plan the plan is to restart his home medications.  Continue with detox as indicated.  We will closely monitor his symptoms and assess his depression and consider outpatient rehab. Total duration of encounter: 6 days   On assessment today, remains euthymic and appropriate for inpatinet rehab. No overt withdrawal symptoms. Thought process linear and goal oriented, no evidence of psychosis.  Residential  rehab placement pending. Denies SI or HI.  His medications included: Continued Seroquel 25 mg at bedtime Continued Trazodone 50 mg at bedtime as needed Haldol Decanoate 50 mg IM received 09/04/2023 Continue Zoloft 50 mg a day  Miguel Rota, MD 09/08/2023 2:53 PM

## 2023-09-08 NOTE — Progress Notes (Signed)
Pt is awake, alert and oriented X4. Pt did not voice any complaints of pain or discomfort. No signs of acute distress noted. Pt denies current SI/HI/AVH, plan or intent. Staff will monitor for pt's safety. 

## 2023-09-08 NOTE — Progress Notes (Signed)
 Pt is asleep. Respirations are even and unlabored. No signs of acute distress noted. Staff will monitor for pt's safety.

## 2023-09-09 DIAGNOSIS — F1510 Other stimulant abuse, uncomplicated: Secondary | ICD-10-CM | POA: Diagnosis not present

## 2023-09-09 MED ORDER — SERTRALINE HCL 50 MG PO TABS: 50.0000 mg | ORAL_TABLET | Freq: Every day | ORAL | 0 refills | Status: AC

## 2023-09-09 MED ORDER — TRAZODONE HCL 50 MG PO TABS: 50.0000 mg | ORAL_TABLET | Freq: Every evening | ORAL | 0 refills | Status: AC | PRN

## 2023-09-09 MED ORDER — HALOPERIDOL DECANOATE 100 MG/ML IM SOLN: 50.0000 mg | INTRAMUSCULAR | 0 refills | Status: AC

## 2023-09-09 MED ORDER — HALOPERIDOL 5 MG PO TABS: 5.0000 mg | ORAL_TABLET | Freq: Every day | ORAL | 0 refills | Status: AC

## 2023-09-09 NOTE — Group Note (Signed)
 Group Topic: Wellness  Group Date: 09/09/2023 Start Time: 1145 End Time: 1230 Facilitators: Londell Moh, NT  Department: [facility]  Number of Participants: 10  Group Focus: Nutrition Treatment Modality:  Psychoeducation Interventions utilized were patient education Purpose: increase insight  Name: Jerry Garrison Date of Birth: 30-Jan-2001  MR: [id number]    Level of Participation: active Quality of Participation: attentive Interactions with others: gave feedback Mood/Affect: appropriate Triggers (if applicable): n/a Cognition: coherent/clear Progress: Gaining insight Response: n/a Plan: patient will be encouraged to attend future groups.  Patients Problems:  Patient Active Problem List   Diagnosis Date Noted   Methamphetamine use disorder, severe, dependence (HCC) 09/02/2023   Schizophrenia, unspecified (HCC) 01/27/2022   Tobacco use disorder 01/27/2022   Cannabis use disorder 01/27/2022   Accidental drug overdose    Overdose of opiate or related narcotic, accidental or unintentional, initial encounter (HCC) 01/25/2022   Acute respiratory failure with hypoxia (HCC) 01/25/2022   Aspiration pneumonia (HCC) 01/25/2022   Transaminitis 01/25/2022   Amphetamine use disorder, severe (HCC)    Schizo affective schizophrenia (HCC) 09/01/2021   Schizophrenia (HCC) 08/07/2021   Brief psychotic disorder (HCC) 01/06/2021   Threatening to others 12/12/2020

## 2023-09-09 NOTE — ED Provider Notes (Addendum)
 Behavioral Health Progress Note  Date and Time: 09/09/2023 8:29 AM Name: Jerry Garrison MRN:  [id number]  Reason for admission   Demeco Garrison is a 23 y.o. male with prior psychiatric history of schizophrenia v schizoaffective disorder v SIPD and methamphetamine use d/o who arrived to [facility] as a direct admit to [facility] (09/02/2023) from [facility] with complaints of paranoia and methamphetamine abuse.  Patient is seeking detox. On admission, reported that he has had at least 5 ED visits and 1 inpatient psychiatric hospitalization in the past 6 months.  He was recently admitted to [facility] between 07/07/2023 to 07/12/2023 for worsening psychosis.  Information obtained during interview   Patient evaluated on the unit. Reports sleep is good. Reports appetite is good. States mood is "energized and productive" today.  Cravings are none. On interview, suicidal ideations are not present. Thoughts of self harm are not present. Homicidal ideations are not present.   There are no auditory hallucinations, visual hallucinations, paranoid ideations, or delusional thought processes.   Side effects to currently prescribed medications are none. There are no somatic complaints. Reports regular bowel movements.. There are no withdrawals reported today.   Per LSCW - Patient has been accepted to [facility] and can be discharged on 09/10/2023 at 12PM.   Review of Systems  Constitutional:  Negative for malaise/fatigue.  HENT:  Negative for congestion.   Respiratory:  Negative for shortness of breath.   Cardiovascular:  Negative for chest pain.  Gastrointestinal:  Negative for abdominal pain, nausea and vomiting.  Genitourinary: Negative.   Musculoskeletal: Negative.        A lock jaw last night that resolved  Neurological:  Negative for dizziness, tremors and headaches.    Diagnosis:  Final diagnoses:  Methamphetamine abuse (HCC)  Polysubstance abuse (HCC)  Substance induced mood disorder (HCC)  Non compliance w  medication regimen   Total Time spent with patient: 30 minutes  Past Psychiatric History: Past psychiatric history significant for history of at least 1 hospitalization to [facility] for psychosis.  He also has a history of methamphetamine and marijuana abuse. Past Medical History: None noted Family History: Unknown Family Psychiatric  History: Unknown Social History: Patient is single and reports that he completed high school.  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  Additional Social History:                         Current Medications:  Current Facility-Administered Medications  Medication Dose Route Frequency Provider Last Rate Last Admin   acetaminophen (TYLENOL) tablet 650 mg  650 mg Oral Q6H PRN Onuoha, Chinwendu V, NP       alum & mag hydroxide-simeth (MAALOX/MYLANTA) 200-200-20 MG/5ML suspension 30 mL  30 mL Oral Q4H PRN Onuoha, Chinwendu V, NP       haloperidol (HALDOL) tablet 5 mg  5 mg Oral TID PRN Onuoha, Chinwendu V, NP       And   diphenhydrAMINE (BENADRYL) capsule 50 mg  50 mg Oral TID PRN Onuoha, Chinwendu V, NP   50 mg at 09/02/23 0255   haloperidol lactate (HALDOL) injection 5 mg  5 mg Intramuscular TID PRN Onuoha, Chinwendu V, NP       And   diphenhydrAMINE (BENADRYL) injection 50 mg  50 mg Intramuscular TID PRN Onuoha, Chinwendu V, NP       And   LORazepam (ATIVAN) injection 2 mg  2 mg Intramuscular TID PRN Onuoha, Chinwendu V, NP       haloperidol lactate (HALDOL) injection 10 mg  10 mg Intramuscular TID PRN Onuoha, Chinwendu V, NP       And   diphenhydrAMINE (BENADRYL) injection 50 mg  50 mg Intramuscular TID PRN Onuoha, Chinwendu V, NP       And   LORazepam (ATIVAN) injection 2 mg  2 mg Intramuscular TID PRN Onuoha, Chinwendu V, NP       haloperidol decanoate (HALDOL DECANOATE) 100 MG/ML injection 50 mg  50 mg Intramuscular Q28 days Rex Kras, MD   50 mg at 09/04/23 1325   magnesium hydroxide (MILK OF  MAGNESIA) suspension 30 mL  30 mL Oral Daily PRN Onuoha, Chinwendu V, NP       sertraline (ZOLOFT) tablet 50 mg  50 mg Oral Daily Rex Kras, MD   50 mg at 09/08/23 0833   traZODone (DESYREL) tablet 50 mg  50 mg Oral QHS PRN Onuoha, Chinwendu V, NP   50 mg at 09/08/23 2121   Current Outpatient Medications  Medication Sig Dispense Refill   haloperidol (HALDOL) 5 MG tablet Take 1 tablet (5 mg total) by mouth at bedtime for 10 days. 10 tablet 0   haloperidol decanoate (HALDOL DECANOATE) 100 MG/ML injection Inject 0.5 mLs (50 mg total) into the muscle every 30 (thirty) days for 1 dose. Next dose is due to be administerred on 08-10-23. 0.5 mL 0    Labs  Lab Results:  Admission on 09/01/2023, Discharged on 09/02/2023  Component Date Value Ref Range Status   WBC 09/01/2023 6.0  4.0 - 10.5 K/uL Final   RBC 09/01/2023 4.45  4.22 - 5.81 MIL/uL Final   Hemoglobin 09/01/2023 13.0  13.0 - 17.0 g/dL Final   HCT 16/04/9603 39.3  39.0 - 52.0 % Final   MCV 09/01/2023 88.3  80.0 - 100.0 fL Final   MCH 09/01/2023 29.2  26.0 - 34.0 pg Final   MCHC 09/01/2023 33.1  30.0 - 36.0 g/dL Final   RDW 54/03/8118 12.1  11.5 - 15.5 % Final   Platelets 09/01/2023 207  150 - 400 K/uL Final   nRBC 09/01/2023 0.0  0.0 - 0.2 % Final   Neutrophils Relative % 09/01/2023 66  % Final   Neutro Abs 09/01/2023 4.0  1.7 - 7.7 K/uL Final   Lymphocytes Relative 09/01/2023 14  % Final   Lymphs Abs 09/01/2023 0.8  0.7 - 4.0 K/uL Final   Monocytes Relative 09/01/2023 18  % Final   Monocytes Absolute 09/01/2023 1.1 (H)  0.1 - 1.0 K/uL Final   Eosinophils Relative 09/01/2023 1  % Final   Eosinophils Absolute 09/01/2023 0.1  0.0 - 0.5 K/uL Final   Basophils Relative 09/01/2023 1  % Final   Basophils Absolute 09/01/2023 0.0  0.0 - 0.1 K/uL Final   Immature Granulocytes 09/01/2023 0  % Final   Abs Immature Granulocytes 09/01/2023 0.02  0.00 - 0.07 K/uL Final   Performed at [facility], [address],  [place], [place] [address]   Sodium 09/01/2023 134 (L)  135 - 145 mmol/L Final   Potassium 09/01/2023 3.5  3.5 - 5.1 mmol/L Final   Chloride 09/01/2023 101  98 - 111 mmol/L Final   CO2 09/01/2023 22  22 - 32 mmol/L Final   Glucose, Bld 09/01/2023 102 (H)  70 - 99 mg/dL Final   Glucose reference range applies only to samples taken after fasting for at least  8 hours.   BUN 09/01/2023 16  6 - 20 mg/dL Final   Creatinine, Ser 09/01/2023 0.92  0.61 - 1.24 mg/dL Final   Calcium 81/19/1478 8.8 (L)  8.9 - 10.3 mg/dL Final   Total Protein 29/56/2130 7.2  6.5 - 8.1 g/dL Final   Albumin 86/57/8469 4.3  3.5 - 5.0 g/dL Final   AST 62/95/2841 19  15 - 41 U/L Final   ALT 09/01/2023 14  0 - 44 U/L Final   Alkaline Phosphatase 09/01/2023 49  38 - 126 U/L Final   Total Bilirubin 09/01/2023 2.2 (H)  0.0 - 1.2 mg/dL Final   GFR, Estimated 09/01/2023 >60  >60 mL/min Final   Comment: (NOTE) Calculated using the CKD-EPI Creatinine Equation (2021)    Anion gap 09/01/2023 11  5 - 15 Final   Performed at [facility], [address], [place], [place] [address]   Alcohol, Ethyl (B) 09/01/2023 <10  <10 mg/dL Final   Comment: (NOTE) Lowest detectable limit for serum alcohol is 10 mg/dL.  For medical purposes only. Performed at [facility] [address] [place], [place] [address]    Acetaminophen (Tylenol), Serum 09/01/2023 <10 (L)  10 - 30 ug/mL Final   Comment: (NOTE) Therapeutic concentrations vary significantly. A range of 10-30 ug/mL  may be an effective concentration for many patients. However, some  are best treated at concentrations outside of this range. Acetaminophen concentrations >150 ug/mL at 4 hours after ingestion  and >50 ug/mL at 12 hours after ingestion are often associated with  toxic reactions.  Performed at [facility], [address], [place], [place] [address]    Salicylate Lvl 09/01/2023 <7.0 (L)  7.0 - 30.0 mg/dL Final    Performed at [facility], [address] [place], [place] [address]   Opiates 09/01/2023 NONE DETECTED  NONE DETECTED Final   Cocaine 09/01/2023 NONE DETECTED  NONE DETECTED Final   Benzodiazepines 09/01/2023 NONE DETECTED  NONE DETECTED Final   Amphetamines 09/01/2023 NONE DETECTED  NONE DETECTED Final   Tetrahydrocannabinol 09/01/2023 POSITIVE (A)  NONE DETECTED Final   Barbiturates 09/01/2023 NONE DETECTED  NONE DETECTED Final   Comment: (NOTE) DRUG SCREEN FOR MEDICAL PURPOSES ONLY.  IF CONFIRMATION IS NEEDED FOR ANY PURPOSE, NOTIFY LAB WITHIN 5 DAYS.  LOWEST DETECTABLE LIMITS FOR URINE DRUG SCREEN Drug Class                     Cutoff (ng/mL) Amphetamine and metabolites    1000 Barbiturate and metabolites    200 Benzodiazepine                 200 Opiates and metabolites        300 Cocaine and metabolites        300 THC                            50 Performed at [facility], [address] [place], [place] [address]   Admission on 07/07/2023, Discharged on 07/12/2023  Component Date Value Ref Range Status   Cholesterol 07/07/2023 117  0 - 200 mg/dL Final   Triglycerides 63/87/5643 22  <150 mg/dL Final   HDL 32/95/1884 56  >40 mg/dL Final   Total CHOL/HDL Ratio 07/07/2023 2.1  RATIO Final   VLDL 07/07/2023 4  0 - 40 mg/dL Final   LDL Cholesterol 07/07/2023 57  0 -  99 mg/dL Final   Comment:        Total Cholesterol/HDL:CHD Risk Coronary Heart Disease Risk Table                     Men   Women  1/2 Average Risk   3.4   3.3  Average Risk       5.0   4.4  2 X Average Risk   9.6   7.1  3 X Average Risk  23.4   11.0        Use the calculated Patient Ratio above and the CHD Risk Table to determine the patient's CHD Risk.        ATP III CLASSIFICATION (LDL):  <100     mg/dL   Optimal  409-811  mg/dL   Near or Above                    Optimal  130-159  mg/dL   Borderline  914-782  mg/dL   High  >956     mg/dL   Very  High Performed at [facility], [address] [place], [place] [address]    Hgb A1c MFr Bld 07/07/2023 5.1  4.8 - 5.6 % Final   Comment: (NOTE)         Prediabetes: 5.7 - 6.4         Diabetes: >6.4         Glycemic control for adults with diabetes: <7.0    Mean Plasma Glucose 07/07/2023 100  mg/dL Final   Comment: (NOTE) Performed At: [facility] [address] [place], [place] [id number] Jolene Schimke MD [id number]    TSH 07/07/2023 2.114  0.350 - 4.500 uIU/mL Final   Comment: Performed by a 3rd Generation assay with a functional sensitivity of <=0.01 uIU/mL. Performed at [facility], [address], [place], [place] [address]   Admission on 07/06/2023, Discharged on 07/06/2023  Component Date Value Ref Range Status   WBC 07/06/2023 6.6  4.0 - 10.5 K/uL Final   RBC 07/06/2023 5.23  4.22 - 5.81 MIL/uL Final   Hemoglobin 07/06/2023 15.0  13.0 - 17.0 g/dL Final   HCT 36/64/4034 45.4  39.0 - 52.0 % Final   MCV 07/06/2023 86.8  80.0 - 100.0 fL Final   MCH 07/06/2023 28.7  26.0 - 34.0 pg Final   MCHC 07/06/2023 33.0  30.0 - 36.0 g/dL Final   RDW 74/25/9563 11.9  11.5 - 15.5 % Final   Platelets 07/06/2023 236  150 - 400 K/uL Final   nRBC 07/06/2023 0.0  0.0 - 0.2 % Final   Neutrophils Relative % 07/06/2023 59  % Final   Neutro Abs 07/06/2023 3.9  1.7 - 7.7 K/uL Final   Lymphocytes Relative 07/06/2023 27  % Final   Lymphs Abs 07/06/2023 1.8  0.7 - 4.0 K/uL Final   Monocytes Relative 07/06/2023 14  % Final   Monocytes Absolute 07/06/2023 0.9  0.1 - 1.0 K/uL Final   Eosinophils Relative 07/06/2023 0  % Final   Eosinophils Absolute 07/06/2023 0.0  0.0 - 0.5 K/uL Final   Basophils Relative 07/06/2023 0  % Final   Basophils Absolute 07/06/2023 0.0  0.0 - 0.1 K/uL Final   Immature Granulocytes 07/06/2023 0  % Final   Abs Immature Granulocytes 07/06/2023 0.01  0.00 - 0.07 K/uL Final   Performed at [facility], [address], [place], [place] [address]  Sodium 07/06/2023 136  135 - 145 mmol/L Final   Potassium 07/06/2023 3.3 (L)  3.5 - 5.1 mmol/L Final   Chloride 07/06/2023 101  98 - 111 mmol/L Final   CO2 07/06/2023 25  22 - 32 mmol/L Final   Glucose, Bld 07/06/2023 85  70 - 99 mg/dL Final   Glucose reference range applies only to samples taken after fasting for at least 8 hours.   BUN 07/06/2023 7  6 - 20 mg/dL Final   Creatinine, Ser 07/06/2023 1.03  0.61 - 1.24 mg/dL Final   Calcium 16/04/9603 9.6  8.9 - 10.3 mg/dL Final   Total Protein 54/03/8118 7.4  6.5 - 8.1 g/dL Final   Albumin 14/78/2956 4.6  3.5 - 5.0 g/dL Final   AST 21/30/8657 20  15 - 41 U/L Final   ALT 07/06/2023 18  0 - 44 U/L Final   Alkaline Phosphatase 07/06/2023 53  38 - 126 U/L Final   Total Bilirubin 07/06/2023 2.9 (H)  0.0 - 1.2 mg/dL Final   GFR, Estimated 07/06/2023 >60  >60 mL/min Final   Comment: (NOTE) Calculated using the CKD-EPI Creatinine Equation (2021)    Anion gap 07/06/2023 10  5 - 15 Final   Performed at [facility], [address] [place], [place] [address]   Alcohol, Ethyl (B) 07/06/2023 <10  <10 mg/dL Final   Comment: (NOTE) Lowest detectable limit for serum alcohol is 10 mg/dL.  For medical purposes only. Performed at [facility], [address] [place], [place] [address]    TSH 07/06/2023 3.214  0.350 - 4.500 uIU/mL Final   Comment: Performed by a 3rd Generation assay with a functional sensitivity of <=0.01 uIU/mL. Performed at [facility], [address], [place], [place] [address]    POC Amphetamine UR 07/06/2023 None Detected  NONE DETECTED (Cut Off Level 1000 ng/mL) Final   POC Secobarbital (BAR) 07/06/2023 None Detected  NONE DETECTED (Cut Off Level 300 ng/mL) Final   POC Buprenorphine (BUP) 07/06/2023 None Detected  NONE DETECTED (Cut Off Level 10 ng/mL) Final   POC Oxazepam (BZO) 07/06/2023 None Detected  NONE DETECTED (Cut Off Level 300 ng/mL) Final   POC Cocaine UR 07/06/2023  None Detected  NONE DETECTED (Cut Off Level 300 ng/mL) Final   POC Methamphetamine UR 07/06/2023 None Detected  NONE DETECTED (Cut Off Level 1000 ng/mL) Final   POC Morphine 07/06/2023 None Detected  NONE DETECTED (Cut Off Level 300 ng/mL) Final   POC Methadone UR 07/06/2023 None Detected  NONE DETECTED (Cut Off Level 300 ng/mL) Final   POC Oxycodone UR 07/06/2023 None Detected  NONE DETECTED (Cut Off Level 100 ng/mL) Final   POC Marijuana UR 07/06/2023 None Detected  NONE DETECTED (Cut Off Level 50 ng/mL) Final    Blood Alcohol level:  Lab Results  Component Value Date   ETH <10 09/01/2023   ETH <10 07/06/2023   Metabolic Disorder Labs: Lab Results  Component Value Date   HGBA1C 5.1 07/07/2023   MPG 100 07/07/2023   MPG 79.58 08/07/2021   No results found for: "PROLACTIN" Lab Results  Component Value Date   CHOL 117 07/07/2023   TRIG 22 07/07/2023   HDL 56 07/07/2023   CHOLHDL 2.1 07/07/2023   VLDL 4 07/07/2023   LDLCALC 57 07/07/2023   LDLCALC 78 08/07/2021    Therapeutic Lab Levels: No results found for: "LITHIUM" No results found for: "VALPROATE" No results found for: "CBMZ"  Physical Findings  AIMS    Flowsheet Row [facility]  AIMS Total Score 0      AUDIT    Flowsheet Row Ad[facility] (Dis[facility] Admission (Discharged[facility]  Alcohol Use Disorder Identification Test Final Score (AUDIT) 8 0      GAD-7    Flowsheet Row Clinical Support from 09/01/2021 in [facility]  Total GAD-7 Score 16      PHQ2-9    Flowsheet Row ED from 09/02/2023 in [facility] from 06/24/2022 in [facility] ED from 10/22/2021 in [facility] at [facility] Counselor from  09/01/2021 in [facility]  PHQ-2 Total Score 5 4 4 4   PHQ-9 Total Score 18 11 17 11       Flowsheet Row ED from 09/02/2023 in [facility] ED from 09/01/2023 in [facility] at [facility] Admission (Discharged) [facility]  C-SSRS RISK CATEGORY No Risk No Risk High Risk        Musculoskeletal  Strength & Muscle Tone: within normal limits Gait & Station: normal Patient leans: N/A  Psychiatric Specialty Exam  Presentation General Appearance:Appropriate for Environment, Casual, Fairly Groomed Eye Contact:Good Speech:Clear and Coherent, Normal Rate (spontaneous) Volume:Normal Handedness:Right  Mood and Affect  Mood:"good" Affect:bright affect; Appropriate, Congruent, Full Range  Thought Process  Thought Process:Coherent, Goal Directed, Linear Descriptions of Associations:Intact  Thought Content Suicidal Thoughts:No Homicidal Thoughts:No Hallucinations:None (Denied AVH) Ideas of Reference:None (Denied paranoia, did not make any delusional statements. Feels safe here) Thought Content:Logical, WDL (going home to mom's after dc. denied medication side effects currently, but did have a "locked jaw" feeling yesterday that has self resolved, no soreness as LAI site. denied cravings, no withdrawal sxs. is considering SIOP. no other questions or concerns)  Sensorium  Memory:Immediate Good Judgment:Fair Insight:Fair  Executive Functions  Orientation:Full (Time, Place and Person) Language:Good Concentration:Good Attention:Good Recall:Good Fund of Knowledge:Good  Psychomotor Activity  Psychomotor Activity:Psychomotor Activity: Normal (No EPS or TD, AIMS 0)  Assets  Assets:Communication Skills, Desire for Improvement, Resilience  Sleep  Quality:Good  Physical Exam  Physical Exam Constitutional:      Appearance: Normal appearance. He is  normal weight.  HENT:     Head: Normocephalic.  Neurological:     General: No focal deficit present.     Mental Status: He is alert and oriented to person, place, and time. Mental status is at baseline.    Blood pressure 126/84, pulse 77, temperature 97.7 F (36.5 C), temperature source Oral, resp. rate 18, SpO2 98%. There is no height or weight on file to calculate BMI.  Treatment Plan Summary: Daily contact with patient to assess and evaluate symptoms and progress in treatment, Medication management, and Plan the plan is to restart his home medications.  Continue with detox as indicated.  We will closely monitor his symptoms and assess his depression and consider outpatient rehab. Total duration of encounter: 7 days   On assessment today, appears to be in good spirits. No overt withdrawal symptoms. Thought process linear and goal oriented, no evidence of psychosis.  Residential rehab placement pending. Denies SI or HI.  His medications included: Continued Seroquel 25 mg at bedtime Continued Trazodone 50 mg at bedtime as needed Haldol Decanoate 50 mg  IM received 09/04/2023 Continue Zoloft 50 mg a day  Medication reconciliation completed today and patient will be given printed scripts for medications at discharge tomorrow.  Dispo: Friday, 09/10/2023 to [facility] by 12 PM  Signed: Lorri Frederick, MD 09/09/2023 8:29 AM

## 2023-09-09 NOTE — ED Notes (Signed)
 Patient is awake and alert in dayroom and is social with peers.  He is quite lively and animated today.  No distress or complaint.  No withdrawal.  Denies avh shi or plan.

## 2023-09-09 NOTE — Discharge Planning (Signed)
 LCSW followed up with [facility] Admissions Coordinator Jake who reports patient would be a good candidate for their treatment facility. Per Leta Jungling, transportation can be arranged for 12:00pm via [facility] for the patient to admit to their facility on tomorrow. LCSW spoke with patient to confirm if he is in agreement with plan. Patient is agreeable to plan and expressed appreciation for LCSW assistance. Mother aware of plan and expressed appreciation for LCSW assistance. No other needs were reported by patient or facility. 30 day script needed and medications can be filled at their facility.   Fernande Boyden, LCSW Clinical Social Worker [place] [facility] Ph: [phone]

## 2023-09-09 NOTE — Discharge Planning (Signed)
 Patient is being considered at [facility] in [place]. LCSW contacted Admissions Coordinator to explore acceptance and admit date. Per Admissions, they are currently awaiting payment for insurance. Once that has been paid, then they can admit the patient into their facility. LCSW followed up with the patient's mother to inform. Mother reports she made the payment on yesterday for insurance. LCSW advised her to follow up with the facility regarding payment and was provided the contact number. Mother aware that the [facility] will not be able to hold the patient here until bed is available. Mother aware that if an admit date is not provided on today then patient will likely discharge home on tomorrow. Mother expressed understanding and stated she will make some phone calls today and will follow up with LCSW later today. No other needs were reported at this time. LCSW will have conversation with patient regarding process and possible return to home if bed is not offered for him. Updates will be provided as received.   Fernande Boyden, LCSW Clinical Social Worker [place] [facility] Ph: [phone]

## 2023-09-09 NOTE — ED Notes (Signed)
 Patient is sleeping. Respirations equal and unlabored, skin warm and dry. No change in assessment or acuity. Routine safety checks conducted according to facility protocol. Will continue to monitor for safety.

## 2023-09-09 NOTE — Group Note (Signed)
 Group Topic: Feelings about Diagnosis  Group Date: 09/09/2023 Start Time: 1120 End Time: 1145 Facilitators: Jenean Lindau, RN  Department: [facility]  Number of Participants: 10  Group Focus: abuse issues, acceptance, affirmation, anger management, anxiety, chemical dependency education, and coping skills Treatment Modality:  Behavior Modification Therapy Interventions utilized were clarification, confrontation, exploration, and group exercise Purpose: enhance coping skills, explore maladaptive thinking, express feelings, express irrational fears, improve communication skills, increase insight, regain self-worth, reinforce self-care, and relapse prevention strategies  Name: Jerry Garrison Date of Birth: [birth year]  MR: [id number]    Level of Participation:  Quality of Participation:  Interactions with others:  Mood/Affect:  Triggers (if applicable):  Cognition:  Progress:  Response:  Plan:   Patients Problems:  Patient Active Problem List   Diagnosis Date Noted   Methamphetamine use disorder, severe, dependence (HCC) 09/02/2023   Schizophrenia, unspecified (HCC) 01/27/2022   Tobacco use disorder 01/27/2022   Cannabis use disorder 01/27/2022   Accidental drug overdose    Overdose of opiate or related narcotic, accidental or unintentional, initial encounter (HCC) 01/25/2022   Acute respiratory failure with hypoxia (HCC) 01/25/2022   Aspiration pneumonia (HCC) 01/25/2022   Transaminitis 01/25/2022   Amphetamine use disorder, severe (HCC)    Schizo affective schizophrenia (HCC) 09/01/2021   Schizophrenia (HCC) 08/07/2021   Brief psychotic disorder (HCC) 01/06/2021   Threatening to others 12/12/2020

## 2023-09-09 NOTE — ED Notes (Signed)
 Patient is seen sitting in the milieu area interacting with peers without any distress noted. He is wearing his personal clothing. Staff will continue to monitor safety and for changes in condition.

## 2023-09-09 NOTE — ED Notes (Signed)
 Pt was provided lunch

## 2023-09-09 NOTE — ED Notes (Signed)
 Pt was provided dinner.

## 2023-09-09 NOTE — Group Note (Signed)
 Group Topic: Wellness  Group Date: 09/09/2023 Start Time: 1000 End Time: 1030 Facilitators: Londell Moh, NT  Department: [facility]  Number of Participants: 10  Group Focus: check in, concentration, and daily focus Treatment Modality:  Psychoeducation Interventions utilized were patient education Purpose: enhance coping skills, express feelings, and increase insight  Name: Jerry Garrison Date of Birth: 2001/06/22  MR: [id number]    Level of Participation: moderate Quality of Participation: attentive Interactions with others: gave feedback Mood/Affect: appropriate Triggers (if applicable): n/a Cognition: coherent/clear Progress: Moderate Response: Pt was able to share goals for the day and express understanding towards rules and expectations of the [facility]bc unit. Plan: patient will be encouraged to attend future groups.  Patients Problems:  Patient Active Problem List   Diagnosis Date Noted   Methamphetamine use disorder, severe, dependence (HCC) 09/02/2023   Schizophrenia, unspecified (HCC) 01/27/2022   Tobacco use disorder 01/27/2022   Cannabis use disorder 01/27/2022   Accidental drug overdose    Overdose of opiate or related narcotic, accidental or unintentional, initial encounter (HCC) 01/25/2022   Acute respiratory failure with hypoxia (HCC) 01/25/2022   Aspiration pneumonia (HCC) 01/25/2022   Transaminitis 01/25/2022   Amphetamine use disorder, severe (HCC)    Schizo affective schizophrenia (HCC) 09/01/2021   Schizophrenia (HCC) 08/07/2021   Brief psychotic disorder (HCC) 01/06/2021   Threatening to others 12/12/2020

## 2023-09-10 DIAGNOSIS — F1510 Other stimulant abuse, uncomplicated: Secondary | ICD-10-CM | POA: Diagnosis not present

## 2023-09-10 NOTE — ED Provider Notes (Signed)
 [facility]BC/OBS ASAP Discharge Summary  Date and Time: 09/10/2023 11:07 AM  Name: Jerry Garrison  MRN:  [id number]   Discharge Diagnoses:  Final diagnoses:  Methamphetamine abuse (HCC)  Polysubstance abuse (HCC)  Substance induced mood disorder (HCC)  Non compliance w medication regimen   Stay Summary:  Jerry Garrison is a 23 y.o. male with prior psychiatric history of schizophrenia v schizoaffective disorder v SIPD and methamphetamine use d/o who arrived to [facility] as a direct admit to [facility] (09/02/2023) from [facility] with complaints of paranoia and methamphetamine abuse.  Patient is seeking detox. On admission, reported that he has had at least 5 ED visits and 1 inpatient psychiatric hospitalization in the past 6 months.  He was recently admitted to [facility] between 07/07/2023 to 07/12/2023 for worsening psychosis.  During the patient's hospitalization at the [facility], patient had extensive initial psychiatric evaluation, with daily follow-up assessments focused on detoxification management.  Psychiatric diagnoses provided upon initial assessment:  Methamphetamine abuse Polysubstance abuse Substance-induced mood disorder  Patient's medications adjusted during hospitalization:  Patient completed clonidine taper with PRNs Patient was initially started on Haldol 5 mg twice daily and was transition to Haldol Decanoate 50 mg q. 28 days, with initial injection given on 3/1 Started and continued on Zoloft 50 mg daily for depressive symptoms   Patient's care was discussed during the interdisciplinary team meeting every day during the hospitalization.  The patient denies having side effects to prescribed psychiatric medication.  Gradually, patient started adjusting to milieu. The patient was evaluated each day by a clinical provider to ascertain response to treatment. Improvement was noted by the patient's report of decreasing symptoms, improved sleep and appetite, affect, medication tolerance, behavior, and participation  in unit programming.  Patient was asked each day to complete a self inventory noting mood, mental status, pain, new symptoms, anxiety and concerns.    Symptoms were reported as significantly decreased or resolved completely by discharge.   On day of discharge, the patient reports that their mood is stable. The patient denied having suicidal thoughts for more than 48 hours prior to discharge.  Patient denies having homicidal thoughts.  Patient denies having auditory hallucinations.  Patient denies any visual hallucinations or other symptoms of psychosis. The patient was motivated to continue taking medication with a goal of continued improvement in mental health.   The patient reported that their withdrawal symptoms and cravings responded well to the detox regimen, with overall benefit from the detox program. Supportive psychotherapy was provided, and the patient participated in regular group therapy sessions focused on managing cravings and withdrawal. Coping skills, problem-solving, and relaxation techniques were also part of the program's therapeutic interventions.  Labs were reviewed with the patient, and abnormal results were discussed with the patient.  The patient is able to verbalize their individual safety plan to this provider.  # It is recommended to the patient to continue psychiatric medications as prescribed, after discharge from the hospital.    # It is recommended to the patient to follow up with their outpatient psychiatric provider and PCP.  # It was discussed with the patient, the impact of alcohol, drugs, tobacco have been there overall psychiatric and medical wellbeing, and total abstinence from substance use was recommended.  # Prescriptions provided or sent directly to preferred pharmacy at discharge. Patient agreeable to plan. Given opportunity to ask questions. Appears to feel comfortable with discharge.    # In the event of worsening symptoms, the patient is instructed to  call the  crisis hotline, 911 and or go to the nearest ED for appropriate evaluation and treatment of symptoms. To follow-up with primary care provider for other medical issues, concerns and or health care needs  # Patient was discharged [facility] with a plan to follow up as noted below.     Total Time spent with patient: 20 minutesd  Past Psychiatric History: Past psychiatric history significant for history of at least 1 hospitalization to [facility] for psychosis.  He also has a history of methamphetamine and marijuana abuse. Past Medical History: None noted Family History: Unknown Family Psychiatric  History: Unknown Social History: Patient is single and reports that he completed high school.  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  Current Medications:  Current Facility-Administered Medications  Medication Dose Route Frequency Provider Last Rate Last Admin   acetaminophen (TYLENOL) tablet 650 mg  650 mg Oral Q6H PRN Onuoha, Chinwendu V, NP       alum & mag hydroxide-simeth (MAALOX/MYLANTA) 200-200-20 MG/5ML suspension 30 mL  30 mL Oral Q4H PRN Onuoha, Chinwendu V, NP       haloperidol (HALDOL) tablet 5 mg  5 mg Oral TID PRN Onuoha, Chinwendu V, NP       And   diphenhydrAMINE (BENADRYL) capsule 50 mg  50 mg Oral TID PRN Onuoha, Chinwendu V, NP   50 mg at 09/02/23 0255   haloperidol lactate (HALDOL) injection 5 mg  5 mg Intramuscular TID PRN Onuoha, Chinwendu V, NP       And   diphenhydrAMINE (BENADRYL) injection 50 mg  50 mg Intramuscular TID PRN Onuoha, Chinwendu V, NP       And   LORazepam (ATIVAN) injection 2 mg  2 mg Intramuscular TID PRN Onuoha, Chinwendu V, NP       haloperidol lactate (HALDOL) injection 10 mg  10 mg Intramuscular TID PRN Onuoha, Chinwendu V, NP       And   diphenhydrAMINE (BENADRYL) injection 50 mg  50 mg Intramuscular TID PRN Onuoha, Chinwendu V, NP       And   LORazepam (ATIVAN) injection 2 mg  2 mg Intramuscular  TID PRN Onuoha, Chinwendu V, NP       haloperidol decanoate (HALDOL DECANOATE) 100 MG/ML injection 50 mg  50 mg Intramuscular Q28 days Rex Kras, MD   50 mg at 09/04/23 1325   magnesium hydroxide (MILK OF MAGNESIA) suspension 30 mL  30 mL Oral Daily PRN Onuoha, Chinwendu V, NP       sertraline (ZOLOFT) tablet 50 mg  50 mg Oral Daily Rex Kras, MD   50 mg at 09/10/23 0858   traZODone (DESYREL) tablet 50 mg  50 mg Oral QHS PRN Onuoha, Chinwendu V, NP   50 mg at 09/09/23 2129   Current Outpatient Medications  Medication Sig Dispense Refill   haloperidol (HALDOL) 5 MG tablet Take 1 tablet (5 mg total) by mouth at bedtime for 7 days. 7 tablet 0   [START ON 09/29/2023] haloperidol decanoate (HALDOL DECANOATE) 100 MG/ML injection Inject 0.5 mLs (50 mg total) into the muscle every 28 (twenty-eight) days. 1 mL 0   sertraline (ZOLOFT) 50 MG tablet Take 1 tablet (50 mg total) by mouth daily. 30 tablet 0   traZODone (DESYREL) 50 MG tablet Take 1 tablet (50 mg total) by mouth at bedtime as needed for sleep. 30 tablet 0    PTA Medications:  Facility Ordered Medications  Medication  acetaminophen (TYLENOL) tablet 650 mg   alum & mag hydroxide-simeth (MAALOX/MYLANTA) 200-200-20 MG/5ML suspension 30 mL   magnesium hydroxide (MILK OF MAGNESIA) suspension 30 mL   [EXPIRED] dicyclomine (BENTYL) tablet 20 mg   [EXPIRED] hydrOXYzine (ATARAX) tablet 25 mg   [EXPIRED] loperamide (IMODIUM) capsule 2-4 mg   [EXPIRED] methocarbamol (ROBAXIN) tablet 500 mg   [EXPIRED] ondansetron (ZOFRAN-ODT) disintegrating tablet 4 mg   haloperidol (HALDOL) tablet 5 mg   And   diphenhydrAMINE (BENADRYL) capsule 50 mg   haloperidol lactate (HALDOL) injection 5 mg   And   diphenhydrAMINE (BENADRYL) injection 50 mg   And   LORazepam (ATIVAN) injection 2 mg   haloperidol lactate (HALDOL) injection 10 mg   And   diphenhydrAMINE (BENADRYL) injection 50 mg   And   LORazepam (ATIVAN) injection 2 mg   traZODone  (DESYREL) tablet 50 mg   [EXPIRED] cloNIDine (CATAPRES) tablet 0.1 mg   [EXPIRED] oxymetazoline (AFRIN) 0.05 % nasal spray 1 spray   sertraline (ZOLOFT) tablet 50 mg   haloperidol decanoate (HALDOL DECANOATE) 100 MG/ML injection 50 mg   PTA Medications  Medication Sig   traZODone (DESYREL) 50 MG tablet Take 1 tablet (50 mg total) by mouth at bedtime as needed for sleep.   [START ON 09/29/2023] haloperidol decanoate (HALDOL DECANOATE) 100 MG/ML injection Inject 0.5 mLs (50 mg total) into the muscle every 28 (twenty-eight) days.   sertraline (ZOLOFT) 50 MG tablet Take 1 tablet (50 mg total) by mouth daily.   haloperidol (HALDOL) 5 MG tablet Take 1 tablet (5 mg total) by mouth at bedtime for 7 days.       09/10/2023   10:14 AM 09/02/2023    2:23 AM 06/24/2022    4:34 PM  Depression screen PHQ 2/9  Decreased Interest 1 3 2   Down, Depressed, Hopeless 1 2 2   PHQ - 2 Score 2 5 4   Altered sleeping 1 3 2   Tired, decreased energy 1 1 1   Change in appetite 0 0 1  Feeling bad or failure about yourself  0 2 1  Trouble concentrating 0 3 1  Moving slowly or fidgety/restless 0 3 0  Suicidal thoughts 0 1 1  PHQ-9 Score 4 18 11   Difficult doing work/chores Not difficult at all Extremely dIfficult Somewhat difficult    Flowsheet Row ED from 09/02/2023 in [facility] ED from 09/01/2023 in [facility] (Discharged[facility]  C-SSRS RISK CATEGORY No Risk No Risk High Risk       Musculoskeletal  Strength & Muscle Tone: within normal limits Gait & Station: normal Patient leans: N/A  Psychiatric Specialty Exam  Presentation  General Appearance:  Appropriate for Environment  Eye Contact: Good  Speech: Clear and Coherent; Normal Rate  Speech Volume: Normal  Handedness: -- (not assessed)   Mood and Affect  Mood: Euthymic  Affect: Congruent; Full  Range   Thought Process  Thought Processes: Linear  Descriptions of Associations:Intact  Orientation:None  Thought Content:Logical  Diagnosis of Schizophrenia or Schizoaffective disorder in past: No  Duration of Psychotic Symptoms: Greater than six months   Hallucinations:Hallucinations: None  Ideas of Reference:None  Suicidal Thoughts:Suicidal Thoughts: No  Homicidal Thoughts:Homicidal Thoughts: No   Sensorium  Memory: Immediate Good; Recent Good; Remote Good  Judgment: Fair  Insight: Fair   Chartered certified accountant: Fair  Attention Span: Fair  Recall: [employer] of Knowledge: Fair  Language: Fair   Psychomotor Activity  Psychomotor Activity:Psychomotor Activity: Normal   Assets  Assets: Desire for Improvement; Resilience; Communication Skills   Sleep  Sleep:Sleep: Good    Physical Exam  Physical Exam Vitals and nursing note reviewed.  Constitutional:      General: He is not in acute distress.    Appearance: He is not ill-appearing.  HENT:     Head: Normocephalic and atraumatic.  Eyes:     Extraocular Movements: Extraocular movements intact.     Conjunctiva/sclera: Conjunctivae normal.  Pulmonary:     Effort: Pulmonary effort is normal. No respiratory distress.  Skin:    General: Skin is warm and dry.  Neurological:     General: No focal deficit present.    Review of Systems  All other systems reviewed and are negative.  Blood pressure 131/87, pulse 92, temperature 97.8 F (36.6 C), temperature source Oral, resp. rate 18, SpO2 98%. There is no height or weight on file to calculate BMI.  Demographic Factors:  Male, Adolescent or young adult, and Low socioeconomic status  Loss Factors: NA  Historical Factors: Impulsivity  Risk Reduction Factors:   Positive social support, Positive therapeutic relationship, and Positive coping skills or problem solving skills  Continued Clinical Symptoms:   Alcohol/Substance Abuse/Dependencies Previous Psychiatric Diagnoses and Treatments  Cognitive Features That Contribute To Risk:  None    Suicide Risk:  Mild:  Suicidal ideation of limited frequency, intensity, duration, and specificity.  There are no identifiable plans, no associated intent, mild dysphoria and related symptoms, good self-control (both objective and subjective assessment), few other risk factors, and identifiable protective factors, including available and accessible social support.  Plan Of Care/Follow-up recommendations:  Activity: as tolerated  Diet: heart healthy  Other: -Follow-up with your outpatient psychiatric provider -instructions on appointment date, time, and address (location) are provided to you in discharge paperwork.  -Take your psychiatric medications as prescribed at discharge - instructions are provided to you in the discharge paperwork   -If you are prescribed an atypical antipsychotic medication, we recommend that your outpatient psychiatrist follow routine screening for side effects within 3 months of discharge, including monitoring: AIMS scale, height, weight, blood pressure, fasting lipid panel, HbA1c, and fasting blood sugar.   -Recommend total abstinence from alcohol, tobacco, and other illicit drug use at discharge.   -If your psychiatric symptoms recur, worsen, or if you have side effects to your psychiatric medications, call your outpatient psychiatric provider, 911, [phone] or go to the nearest emergency department.  -If suicidal thoughts occur, immediately call your outpatient psychiatric provider, 911, [phone] or go to the nearest emergency department.   Disposition: [facility]   Lorri Frederick, MD 09/10/2023, 11:07 AM

## 2023-09-10 NOTE — Discharge Planning (Signed)
 LCSW spoke with patient on today who reports being excited about moving forward and seeking further treatment. Brief supportive counseling was provided to the patient and patient was receptive to the feedback provided. Patient and mother aware of plan. [facility] is 12:00pm. No other needs were reported at this time. LCSW to sign off. Please inform if further LCSW needs arise prior to discharge.   Fernande Boyden, LCSW Clinical Social Worker [place] [facility] Ph: [phone]

## 2023-09-10 NOTE — Progress Notes (Addendum)
 Jerry Garrison to be D/C'd  to [facility]   per MD order. Discussed with the patient and all questions fully answered. An After Visit Summary was printed and given to the patient. Medication scripts were also given to patient. All belongings returned. Patient escorted out and D/C via private auto.  [name]  09/10/2023 12:14 PM

## 2023-09-10 NOTE — ED Notes (Signed)
 Patient is resting in bed with eyes closed without any distress noted. Staff will continue to monitor safety and for changes in condition.

## 2023-09-10 NOTE — Progress Notes (Signed)
Pt is awake, alert and oriented X4. Pt did not voice any complaints of pain or discomfort. No signs of acute distress noted. Administered scheduled med per order. Pt denies current SI/HI/AVH, plan or intent. Staff will monitor for pt's safety.
